# Patient Record
Sex: Female | Born: 1950 | Race: White | Hispanic: No | Marital: Married | Smoking: Former smoker
Health system: Southern US, Community
[De-identification: ages and names within clinical notes are randomized; demographics above are authoritative.]

## PROBLEM LIST (undated history)

## (undated) DIAGNOSIS — C50919 Malignant neoplasm of unspecified site of unspecified female breast: Secondary | ICD-10-CM

## (undated) DIAGNOSIS — K219 Gastro-esophageal reflux disease without esophagitis: Secondary | ICD-10-CM

## (undated) DIAGNOSIS — Z923 Personal history of irradiation: Secondary | ICD-10-CM

## (undated) DIAGNOSIS — M810 Age-related osteoporosis without current pathological fracture: Secondary | ICD-10-CM

## (undated) DIAGNOSIS — F32A Depression, unspecified: Secondary | ICD-10-CM

## (undated) DIAGNOSIS — T7840XA Allergy, unspecified, initial encounter: Secondary | ICD-10-CM

## (undated) DIAGNOSIS — D649 Anemia, unspecified: Secondary | ICD-10-CM

## (undated) DIAGNOSIS — I10 Essential (primary) hypertension: Secondary | ICD-10-CM

## (undated) DIAGNOSIS — F329 Major depressive disorder, single episode, unspecified: Secondary | ICD-10-CM

## (undated) DIAGNOSIS — Z9221 Personal history of antineoplastic chemotherapy: Secondary | ICD-10-CM

## (undated) DIAGNOSIS — E785 Hyperlipidemia, unspecified: Secondary | ICD-10-CM

## (undated) DIAGNOSIS — F419 Anxiety disorder, unspecified: Secondary | ICD-10-CM

## (undated) DIAGNOSIS — M8580 Other specified disorders of bone density and structure, unspecified site: Secondary | ICD-10-CM

## (undated) HISTORY — PX: COLONOSCOPY: SHX174

## (undated) HISTORY — DX: Essential (primary) hypertension: I10

## (undated) HISTORY — DX: Gastro-esophageal reflux disease without esophagitis: K21.9

## (undated) HISTORY — DX: Allergy, unspecified, initial encounter: T78.40XA

## (undated) HISTORY — PX: SHOULDER SURGERY: SHX246

## (undated) HISTORY — DX: Other specified disorders of bone density and structure, unspecified site: M85.80

## (undated) HISTORY — DX: Hyperlipidemia, unspecified: E78.5

## (undated) HISTORY — DX: Anemia, unspecified: D64.9

## (undated) HISTORY — PX: POLYPECTOMY: SHX149

## (undated) HISTORY — DX: Anxiety disorder, unspecified: F41.9

## (undated) HISTORY — PX: ABDOMINAL HYSTERECTOMY: SHX81

## (undated) HISTORY — DX: Depression, unspecified: F32.A

## (undated) HISTORY — DX: Age-related osteoporosis without current pathological fracture: M81.0

## (undated) HISTORY — PX: TONSILLECTOMY AND ADENOIDECTOMY: SHX28

## (undated) HISTORY — DX: Malignant neoplasm of unspecified site of unspecified female breast: C50.919

## (undated) HISTORY — PX: SEPTOPLASTY: SUR1290

## (undated) HISTORY — DX: Major depressive disorder, single episode, unspecified: F32.9

---

## 1997-08-15 ENCOUNTER — Other Ambulatory Visit: Admission: RE | Admit: 1997-08-15 | Discharge: 1997-08-15 | Payer: Self-pay | Admitting: Obstetrics & Gynecology

## 1998-09-19 ENCOUNTER — Other Ambulatory Visit: Admission: RE | Admit: 1998-09-19 | Discharge: 1998-09-19 | Payer: Self-pay | Admitting: Obstetrics & Gynecology

## 2001-03-15 ENCOUNTER — Other Ambulatory Visit: Admission: RE | Admit: 2001-03-15 | Discharge: 2001-03-15 | Payer: Self-pay | Admitting: Obstetrics & Gynecology

## 2001-05-31 ENCOUNTER — Encounter (INDEPENDENT_AMBULATORY_CARE_PROVIDER_SITE_OTHER): Payer: Self-pay | Admitting: Specialist

## 2001-05-31 ENCOUNTER — Observation Stay (HOSPITAL_COMMUNITY): Admission: RE | Admit: 2001-05-31 | Discharge: 2001-06-01 | Payer: Self-pay | Admitting: Obstetrics & Gynecology

## 2002-04-07 ENCOUNTER — Other Ambulatory Visit: Admission: RE | Admit: 2002-04-07 | Discharge: 2002-04-07 | Payer: Self-pay | Admitting: Obstetrics & Gynecology

## 2003-06-18 ENCOUNTER — Other Ambulatory Visit: Admission: RE | Admit: 2003-06-18 | Discharge: 2003-06-18 | Payer: Self-pay | Admitting: Obstetrics & Gynecology

## 2004-07-10 ENCOUNTER — Other Ambulatory Visit: Admission: RE | Admit: 2004-07-10 | Discharge: 2004-07-10 | Payer: Self-pay | Admitting: Obstetrics and Gynecology

## 2004-09-10 ENCOUNTER — Ambulatory Visit: Payer: Self-pay | Admitting: Internal Medicine

## 2004-09-11 ENCOUNTER — Ambulatory Visit: Payer: Self-pay | Admitting: Internal Medicine

## 2005-11-18 ENCOUNTER — Ambulatory Visit: Payer: Self-pay | Admitting: Internal Medicine

## 2006-10-13 ENCOUNTER — Encounter: Admission: RE | Admit: 2006-10-13 | Discharge: 2006-10-13 | Payer: Self-pay | Admitting: Obstetrics and Gynecology

## 2007-04-11 ENCOUNTER — Encounter: Admission: RE | Admit: 2007-04-11 | Discharge: 2007-04-11 | Payer: Self-pay | Admitting: Obstetrics and Gynecology

## 2007-07-01 ENCOUNTER — Ambulatory Visit: Payer: Self-pay | Admitting: Internal Medicine

## 2007-07-01 DIAGNOSIS — M949 Disorder of cartilage, unspecified: Secondary | ICD-10-CM

## 2007-07-01 DIAGNOSIS — K219 Gastro-esophageal reflux disease without esophagitis: Secondary | ICD-10-CM

## 2007-07-01 DIAGNOSIS — G4700 Insomnia, unspecified: Secondary | ICD-10-CM

## 2007-07-01 DIAGNOSIS — M899 Disorder of bone, unspecified: Secondary | ICD-10-CM | POA: Insufficient documentation

## 2007-07-07 ENCOUNTER — Telehealth (INDEPENDENT_AMBULATORY_CARE_PROVIDER_SITE_OTHER): Payer: Self-pay | Admitting: *Deleted

## 2007-07-07 DIAGNOSIS — D509 Iron deficiency anemia, unspecified: Secondary | ICD-10-CM | POA: Insufficient documentation

## 2007-07-07 LAB — CONVERTED CEMR LAB
ALT: 19 units/L (ref 0–35)
AST: 26 units/L (ref 0–37)
BUN: 10 mg/dL (ref 6–23)
Basophils Absolute: 0 10*3/uL (ref 0.0–0.1)
Basophils Relative: 0.7 % (ref 0.0–1.0)
CO2: 29 meq/L (ref 19–32)
Chloride: 107 meq/L (ref 96–112)
Creatinine, Ser: 0.8 mg/dL (ref 0.4–1.2)
Eosinophils Relative: 3.7 % (ref 0.0–5.0)
Ferritin: 6.2 ng/mL — ABNORMAL LOW (ref 10.0–291.0)
Folate: 15.6 ng/mL
HDL: 64.2 mg/dL (ref >39.0)
Iron: 21 ug/dL — ABNORMAL LOW (ref 42–145)
Lymphocytes Relative: 23.2 % (ref 12.0–46.0)
Neutrophils Relative %: 64 % (ref 43.0–77.0)
RBC: 4.08 M/uL (ref 3.87–5.11)
TSH: 1.41 microintl units/mL (ref 0.35–5.50)
Total Bilirubin: 0.9 mg/dL (ref 0.3–1.2)
Total CHOL/HDL Ratio: 3.6
VLDL: 23 mg/dL (ref 0–40)
WBC: 5.9 10*3/uL (ref 4.5–10.5)

## 2007-07-08 ENCOUNTER — Encounter (INDEPENDENT_AMBULATORY_CARE_PROVIDER_SITE_OTHER): Payer: Self-pay | Admitting: *Deleted

## 2007-07-11 ENCOUNTER — Telehealth (INDEPENDENT_AMBULATORY_CARE_PROVIDER_SITE_OTHER): Payer: Self-pay | Admitting: *Deleted

## 2007-07-21 ENCOUNTER — Telehealth (INDEPENDENT_AMBULATORY_CARE_PROVIDER_SITE_OTHER): Payer: Self-pay | Admitting: *Deleted

## 2007-07-28 ENCOUNTER — Ambulatory Visit: Payer: Self-pay | Admitting: Gastroenterology

## 2007-07-28 LAB — CONVERTED CEMR LAB: IgA: 161 mg/dL (ref 68–378)

## 2007-08-02 ENCOUNTER — Telehealth (INDEPENDENT_AMBULATORY_CARE_PROVIDER_SITE_OTHER): Payer: Self-pay | Admitting: *Deleted

## 2007-08-04 ENCOUNTER — Telehealth (INDEPENDENT_AMBULATORY_CARE_PROVIDER_SITE_OTHER): Payer: Self-pay | Admitting: *Deleted

## 2007-08-24 ENCOUNTER — Encounter: Payer: Self-pay | Admitting: Gastroenterology

## 2007-08-24 ENCOUNTER — Ambulatory Visit: Payer: Self-pay | Admitting: Gastroenterology

## 2007-08-25 ENCOUNTER — Encounter: Payer: Self-pay | Admitting: Gastroenterology

## 2007-11-21 ENCOUNTER — Telehealth (INDEPENDENT_AMBULATORY_CARE_PROVIDER_SITE_OTHER): Payer: Self-pay | Admitting: *Deleted

## 2007-12-02 ENCOUNTER — Encounter: Admission: RE | Admit: 2007-12-02 | Discharge: 2007-12-02 | Payer: Self-pay | Admitting: Obstetrics and Gynecology

## 2007-12-08 ENCOUNTER — Ambulatory Visit: Payer: Self-pay | Admitting: Family Medicine

## 2008-01-27 DIAGNOSIS — C50919 Malignant neoplasm of unspecified site of unspecified female breast: Secondary | ICD-10-CM

## 2008-01-27 HISTORY — DX: Malignant neoplasm of unspecified site of unspecified female breast: C50.919

## 2008-04-04 ENCOUNTER — Telehealth (INDEPENDENT_AMBULATORY_CARE_PROVIDER_SITE_OTHER): Payer: Self-pay | Admitting: *Deleted

## 2008-07-10 ENCOUNTER — Ambulatory Visit: Payer: Self-pay | Admitting: Internal Medicine

## 2008-07-13 ENCOUNTER — Ambulatory Visit: Payer: Self-pay | Admitting: Internal Medicine

## 2008-07-13 LAB — CONVERTED CEMR LAB: Vit D, 25-Hydroxy: 30 ng/mL (ref 30–89)

## 2008-07-17 DIAGNOSIS — E785 Hyperlipidemia, unspecified: Secondary | ICD-10-CM | POA: Insufficient documentation

## 2008-07-17 LAB — CONVERTED CEMR LAB
BUN: 8 mg/dL (ref 6–23)
Basophils Relative: 0.6 % (ref 0.0–3.0)
Calcium: 9.3 mg/dL (ref 8.4–10.5)
Cholesterol: 275 mg/dL — ABNORMAL HIGH (ref 0–200)
Creatinine, Ser: 0.7 mg/dL (ref 0.4–1.2)
Ferritin: 48 ng/mL (ref 10.0–291.0)
Folate: >20 ng/mL
GFR calc non Af Amer: 91.05 mL/min (ref >60)
Glucose, Bld: 92 mg/dL (ref 70–99)
Hemoglobin: 15.2 g/dL — ABNORMAL HIGH (ref 12.0–15.0)
Lymphocytes Relative: 22.9 % (ref 12.0–46.0)
Monocytes Relative: 6.8 % (ref 3.0–12.0)
Neutro Abs: 5.1 10*3/uL (ref 1.4–7.7)
Neutrophils Relative %: 66.6 % (ref 43.0–77.0)
RBC: 4.75 M/uL (ref 3.87–5.11)
Sodium: 141 meq/L (ref 135–145)
TSH: 1.3 microintl units/mL (ref 0.35–5.50)
Total CHOL/HDL Ratio: 3
Vitamin B-12: 458 pg/mL (ref 211–911)
WBC: 7.5 10*3/uL (ref 4.5–10.5)

## 2008-08-02 ENCOUNTER — Telehealth: Payer: Self-pay | Admitting: Internal Medicine

## 2008-09-26 LAB — CONVERTED CEMR LAB: Pap Smear: NORMAL

## 2008-10-12 ENCOUNTER — Telehealth (INDEPENDENT_AMBULATORY_CARE_PROVIDER_SITE_OTHER): Payer: Self-pay | Admitting: *Deleted

## 2008-10-16 ENCOUNTER — Encounter: Admission: RE | Admit: 2008-10-16 | Discharge: 2008-10-16 | Payer: Self-pay | Admitting: Obstetrics and Gynecology

## 2008-10-16 ENCOUNTER — Encounter (INDEPENDENT_AMBULATORY_CARE_PROVIDER_SITE_OTHER): Payer: Self-pay | Admitting: Obstetrics and Gynecology

## 2008-10-16 ENCOUNTER — Telehealth (INDEPENDENT_AMBULATORY_CARE_PROVIDER_SITE_OTHER): Payer: Self-pay | Admitting: *Deleted

## 2008-10-16 ENCOUNTER — Encounter (INDEPENDENT_AMBULATORY_CARE_PROVIDER_SITE_OTHER): Payer: Self-pay | Admitting: Radiology

## 2008-10-24 ENCOUNTER — Encounter: Admission: RE | Admit: 2008-10-24 | Discharge: 2008-10-24 | Payer: Self-pay | Admitting: Obstetrics and Gynecology

## 2008-10-26 ENCOUNTER — Encounter: Payer: Self-pay | Admitting: Internal Medicine

## 2008-10-26 ENCOUNTER — Encounter: Admission: RE | Admit: 2008-10-26 | Discharge: 2008-10-26 | Payer: Self-pay | Admitting: Surgery

## 2008-10-26 ENCOUNTER — Ambulatory Visit: Payer: Self-pay | Admitting: Internal Medicine

## 2008-10-26 DIAGNOSIS — C50919 Malignant neoplasm of unspecified site of unspecified female breast: Secondary | ICD-10-CM | POA: Insufficient documentation

## 2008-10-26 DIAGNOSIS — F411 Generalized anxiety disorder: Secondary | ICD-10-CM | POA: Insufficient documentation

## 2008-10-29 HISTORY — PX: BREAST LUMPECTOMY: SHX2

## 2008-10-31 ENCOUNTER — Encounter (INDEPENDENT_AMBULATORY_CARE_PROVIDER_SITE_OTHER): Payer: Self-pay | Admitting: Surgery

## 2008-10-31 ENCOUNTER — Ambulatory Visit (HOSPITAL_BASED_OUTPATIENT_CLINIC_OR_DEPARTMENT_OTHER): Admission: RE | Admit: 2008-10-31 | Discharge: 2008-11-01 | Payer: Self-pay | Admitting: Surgery

## 2008-10-31 ENCOUNTER — Encounter: Admission: RE | Admit: 2008-10-31 | Discharge: 2008-10-31 | Payer: Self-pay | Admitting: Surgery

## 2008-11-07 ENCOUNTER — Ambulatory Visit: Payer: Self-pay | Admitting: Oncology

## 2008-11-14 ENCOUNTER — Encounter: Payer: Self-pay | Admitting: Internal Medicine

## 2008-11-14 LAB — COMPREHENSIVE METABOLIC PANEL
AST: 17 U/L (ref 0–37)
Albumin: 4.5 g/dL (ref 3.5–5.2)
Alkaline Phosphatase: 81 U/L (ref 39–117)
BUN: 16 mg/dL (ref 6–23)
Creatinine, Ser: 0.84 mg/dL (ref 0.40–1.20)
Potassium: 4.8 mEq/L (ref 3.5–5.3)
Total Bilirubin: 0.8 mg/dL (ref 0.3–1.2)

## 2008-11-14 LAB — CBC WITH DIFFERENTIAL/PLATELET
Basophils Absolute: 0 10*3/uL (ref 0.0–0.1)
EOS%: 3.2 % (ref 0.0–7.0)
HGB: 14.3 g/dL (ref 11.6–15.9)
LYMPH%: 19.7 % (ref 14.0–49.7)
MCH: 31 pg (ref 25.1–34.0)
MCV: 90.3 fL (ref 79.5–101.0)
MONO%: 7.4 % (ref 0.0–14.0)
Platelets: 333 10*3/uL (ref 145–400)
RDW: 12.2 % (ref 11.2–14.5)

## 2008-11-14 LAB — VITAMIN D 25 HYDROXY (VIT D DEFICIENCY, FRACTURES): Vit D, 25-Hydroxy: 32 ng/mL (ref 30–89)

## 2008-11-14 LAB — CANCER ANTIGEN 27.29: CA 27.29: 11 U/mL (ref 0–39)

## 2008-11-21 ENCOUNTER — Ambulatory Visit: Payer: Self-pay | Admitting: Cardiology

## 2008-11-21 ENCOUNTER — Encounter: Payer: Self-pay | Admitting: Oncology

## 2008-11-21 ENCOUNTER — Ambulatory Visit: Payer: Self-pay

## 2008-11-21 ENCOUNTER — Ambulatory Visit (HOSPITAL_COMMUNITY): Admission: RE | Admit: 2008-11-21 | Discharge: 2008-11-21 | Payer: Self-pay | Admitting: Oncology

## 2008-11-23 ENCOUNTER — Encounter: Payer: Self-pay | Admitting: Internal Medicine

## 2008-11-27 ENCOUNTER — Ambulatory Visit (HOSPITAL_BASED_OUTPATIENT_CLINIC_OR_DEPARTMENT_OTHER): Admission: RE | Admit: 2008-11-27 | Discharge: 2008-11-27 | Payer: Self-pay | Admitting: Surgery

## 2008-12-05 ENCOUNTER — Ambulatory Visit: Payer: Self-pay | Admitting: Oncology

## 2008-12-06 ENCOUNTER — Ambulatory Visit (HOSPITAL_COMMUNITY): Admission: RE | Admit: 2008-12-06 | Discharge: 2008-12-06 | Payer: Self-pay | Admitting: Oncology

## 2008-12-07 ENCOUNTER — Encounter: Payer: Self-pay | Admitting: Internal Medicine

## 2008-12-07 LAB — BASIC METABOLIC PANEL
BUN: 16 mg/dL (ref 6–23)
CO2: 24 mEq/L (ref 19–32)
Chloride: 101 mEq/L (ref 96–112)
Creatinine, Ser: 0.78 mg/dL (ref 0.40–1.20)
Glucose, Bld: 135 mg/dL — ABNORMAL HIGH (ref 70–99)

## 2008-12-07 LAB — CBC WITH DIFFERENTIAL/PLATELET
Basophils Absolute: 0 10*3/uL (ref 0.0–0.1)
EOS%: 1 % (ref 0.0–7.0)
HCT: 39.3 % (ref 34.8–46.6)
HGB: 13.5 g/dL (ref 11.6–15.9)
MCH: 31 pg (ref 25.1–34.0)
MONO#: 0 10*3/uL — ABNORMAL LOW (ref 0.1–0.9)
NEUT%: 61.1 % (ref 38.4–76.8)
lymph#: 1.1 10*3/uL (ref 0.9–3.3)

## 2008-12-18 ENCOUNTER — Encounter: Payer: Self-pay | Admitting: Internal Medicine

## 2008-12-18 LAB — COMPREHENSIVE METABOLIC PANEL
AST: 25 U/L (ref 0–37)
Albumin: 3.8 g/dL (ref 3.5–5.2)
Alkaline Phosphatase: 87 U/L (ref 39–117)
BUN: 17 mg/dL (ref 6–23)
Potassium: 4.2 mEq/L (ref 3.5–5.3)
Sodium: 139 mEq/L (ref 135–145)
Total Protein: 6.2 g/dL (ref 6.0–8.3)

## 2008-12-18 LAB — CBC WITH DIFFERENTIAL/PLATELET
EOS%: 0.6 % (ref 0.0–7.0)
MCH: 31.3 pg (ref 25.1–34.0)
MCHC: 34.3 g/dL (ref 31.5–36.0)
MCV: 91.1 fL (ref 79.5–101.0)
MONO%: 6.1 % (ref 0.0–14.0)
RBC: 4.03 10*6/uL (ref 3.70–5.45)
RDW: 12.9 % (ref 11.2–14.5)

## 2008-12-28 ENCOUNTER — Encounter: Payer: Self-pay | Admitting: Internal Medicine

## 2008-12-28 LAB — CBC WITH DIFFERENTIAL/PLATELET
BASO%: 3.2 % — ABNORMAL HIGH (ref 0.0–2.0)
Eosinophils Absolute: 0 10*3/uL (ref 0.0–0.5)
HCT: 34.9 % (ref 34.8–46.6)
MCHC: 33.2 g/dL (ref 31.5–36.0)
MONO#: 0.3 10*3/uL (ref 0.1–0.9)
NEUT#: 0.6 10*3/uL — ABNORMAL LOW (ref 1.5–6.5)
NEUT%: 30.1 % — ABNORMAL LOW (ref 38.4–76.8)
RBC: 3.89 10*6/uL (ref 3.70–5.45)
WBC: 1.9 10*3/uL — ABNORMAL LOW (ref 3.9–10.3)
lymph#: 1 10*3/uL (ref 0.9–3.3)

## 2009-01-07 ENCOUNTER — Ambulatory Visit: Payer: Self-pay | Admitting: Oncology

## 2009-01-09 ENCOUNTER — Encounter: Payer: Self-pay | Admitting: Internal Medicine

## 2009-01-09 LAB — COMPREHENSIVE METABOLIC PANEL
ALT: 22 U/L (ref 0–35)
BUN: 13 mg/dL (ref 6–23)
CO2: 28 mEq/L (ref 19–32)
Calcium: 9.4 mg/dL (ref 8.4–10.5)
Chloride: 101 mEq/L (ref 96–112)
Creatinine, Ser: 0.68 mg/dL (ref 0.40–1.20)
Glucose, Bld: 86 mg/dL (ref 70–99)

## 2009-01-09 LAB — CBC WITH DIFFERENTIAL/PLATELET
Basophils Absolute: 0.1 10*3/uL (ref 0.0–0.1)
Eosinophils Absolute: 0 10*3/uL (ref 0.0–0.5)
HCT: 34.7 % — ABNORMAL LOW (ref 34.8–46.6)
HGB: 12.1 g/dL (ref 11.6–15.9)
MONO#: 0.9 10*3/uL (ref 0.1–0.9)
NEUT%: 81.8 % — ABNORMAL HIGH (ref 38.4–76.8)
Platelets: 413 10*3/uL — ABNORMAL HIGH (ref 145–400)
WBC: 13.6 10*3/uL — ABNORMAL HIGH (ref 3.9–10.3)
lymph#: 1.4 10*3/uL (ref 0.9–3.3)

## 2009-01-17 LAB — CBC WITH DIFFERENTIAL/PLATELET
BASO%: 1.7 % (ref 0.0–2.0)
HCT: 31.1 % — ABNORMAL LOW (ref 34.8–46.6)
HGB: 10.7 g/dL — ABNORMAL LOW (ref 11.6–15.9)
MCHC: 34.4 g/dL (ref 31.5–36.0)
MONO#: 0.3 10*3/uL (ref 0.1–0.9)
NEUT#: 0.6 10*3/uL — ABNORMAL LOW (ref 1.5–6.5)
NEUT%: 32 % — ABNORMAL LOW (ref 38.4–76.8)
WBC: 1.9 10*3/uL — ABNORMAL LOW (ref 3.9–10.3)
lymph#: 0.9 10*3/uL (ref 0.9–3.3)

## 2009-01-31 ENCOUNTER — Encounter: Payer: Self-pay | Admitting: Internal Medicine

## 2009-01-31 LAB — COMPREHENSIVE METABOLIC PANEL
AST: 18 U/L (ref 0–37)
Albumin: 4.4 g/dL (ref 3.5–5.2)
BUN: 14 mg/dL (ref 6–23)
CO2: 24 mEq/L (ref 19–32)
Calcium: 9.5 mg/dL (ref 8.4–10.5)
Chloride: 104 mEq/L (ref 96–112)
Creatinine, Ser: 0.62 mg/dL (ref 0.40–1.20)
Potassium: 4.2 mEq/L (ref 3.5–5.3)

## 2009-01-31 LAB — CBC WITH DIFFERENTIAL/PLATELET
BASO%: 0.2 % (ref 0.0–2.0)
Basophils Absolute: 0 10*3/uL (ref 0.0–0.1)
Eosinophils Absolute: 0 10*3/uL (ref 0.0–0.5)
HCT: 36.1 % (ref 34.8–46.6)
HGB: 12.1 g/dL (ref 11.6–15.9)
LYMPH%: 4.4 % — ABNORMAL LOW (ref 14.0–49.7)
MCHC: 33.5 g/dL (ref 31.5–36.0)
MONO#: 0.4 10*3/uL (ref 0.1–0.9)
NEUT#: 12.3 10*3/uL — ABNORMAL HIGH (ref 1.5–6.5)
NEUT%: 92.3 % — ABNORMAL HIGH (ref 38.4–76.8)
Platelets: 362 10*3/uL (ref 145–400)
WBC: 13.3 10*3/uL — ABNORMAL HIGH (ref 3.9–10.3)
lymph#: 0.6 10*3/uL — ABNORMAL LOW (ref 0.9–3.3)

## 2009-02-07 ENCOUNTER — Encounter: Payer: Self-pay | Admitting: Internal Medicine

## 2009-02-07 ENCOUNTER — Ambulatory Visit: Payer: Self-pay | Admitting: Oncology

## 2009-02-07 LAB — CBC WITH DIFFERENTIAL/PLATELET
BASO%: 3.2 % — ABNORMAL HIGH (ref 0.0–2.0)
Basophils Absolute: 0.1 10*3/uL (ref 0.0–0.1)
EOS%: 2.6 % (ref 0.0–7.0)
HCT: 32.2 % — ABNORMAL LOW (ref 34.8–46.6)
HGB: 10.5 g/dL — ABNORMAL LOW (ref 11.6–15.9)
MCH: 30.9 pg (ref 25.1–34.0)
MCHC: 32.6 g/dL (ref 31.5–36.0)
MONO#: 0.2 10*3/uL (ref 0.1–0.9)
RDW: 17 % — ABNORMAL HIGH (ref 11.2–14.5)
WBC: 1.6 10*3/uL — ABNORMAL LOW (ref 3.9–10.3)
lymph#: 0.8 10*3/uL — ABNORMAL LOW (ref 0.9–3.3)

## 2009-02-21 ENCOUNTER — Encounter: Payer: Self-pay | Admitting: Internal Medicine

## 2009-02-21 LAB — COMPREHENSIVE METABOLIC PANEL
AST: 15 U/L (ref 0–37)
Albumin: 4.2 g/dL (ref 3.5–5.2)
Alkaline Phosphatase: 73 U/L (ref 39–117)
Calcium: 9.9 mg/dL (ref 8.4–10.5)
Chloride: 103 mEq/L (ref 96–112)
Potassium: 4.6 mEq/L (ref 3.5–5.3)
Sodium: 139 mEq/L (ref 135–145)
Total Protein: 6.4 g/dL (ref 6.0–8.3)

## 2009-02-21 LAB — CBC WITH DIFFERENTIAL/PLATELET
BASO%: 1.8 % (ref 0.0–2.0)
EOS%: 0 % (ref 0.0–7.0)
MCH: 32.5 pg (ref 25.1–34.0)
MCHC: 34.1 g/dL (ref 31.5–36.0)
MCV: 95.5 fL (ref 79.5–101.0)
MONO%: 2.1 % (ref 0.0–14.0)
RBC: 3.75 10*6/uL (ref 3.70–5.45)
RDW: 17.2 % — ABNORMAL HIGH (ref 11.2–14.5)

## 2009-02-28 ENCOUNTER — Encounter: Payer: Self-pay | Admitting: Internal Medicine

## 2009-02-28 LAB — CBC WITH DIFFERENTIAL/PLATELET
Basophils Absolute: 0 10*3/uL (ref 0.0–0.1)
EOS%: 4.4 % (ref 0.0–7.0)
Eosinophils Absolute: 0 10*3/uL (ref 0.0–0.5)
HCT: 27.6 % — ABNORMAL LOW (ref 34.8–46.6)
HGB: 9.6 g/dL — ABNORMAL LOW (ref 11.6–15.9)
MCH: 32.7 pg (ref 25.1–34.0)
NEUT#: 0.3 10*3/uL — CL (ref 1.5–6.5)
NEUT%: 30.4 % — ABNORMAL LOW (ref 38.4–76.8)
lymph#: 0.5 10*3/uL — ABNORMAL LOW (ref 0.9–3.3)

## 2009-03-12 ENCOUNTER — Ambulatory Visit: Payer: Self-pay | Admitting: Oncology

## 2009-03-14 ENCOUNTER — Encounter: Payer: Self-pay | Admitting: Internal Medicine

## 2009-03-14 LAB — COMPREHENSIVE METABOLIC PANEL
AST: 23 U/L (ref 0–37)
Alkaline Phosphatase: 61 U/L (ref 39–117)
BUN: 12 mg/dL (ref 6–23)
Creatinine, Ser: 0.67 mg/dL (ref 0.40–1.20)
Glucose, Bld: 80 mg/dL (ref 70–99)

## 2009-03-14 LAB — CBC WITH DIFFERENTIAL/PLATELET
Basophils Absolute: 0 10*3/uL (ref 0.0–0.1)
EOS%: 0 % (ref 0.0–7.0)
Eosinophils Absolute: 0 10*3/uL (ref 0.0–0.5)
HCT: 31 % — ABNORMAL LOW (ref 34.8–46.6)
HGB: 10.6 g/dL — ABNORMAL LOW (ref 11.6–15.9)
LYMPH%: 9.1 % — ABNORMAL LOW (ref 14.0–49.7)
MCH: 32.4 pg (ref 25.1–34.0)
MCV: 94.8 fL (ref 79.5–101.0)
MONO%: 8.3 % (ref 0.0–14.0)
NEUT%: 82.3 % — ABNORMAL HIGH (ref 38.4–76.8)
Platelets: 504 10*3/uL — ABNORMAL HIGH (ref 145–400)

## 2009-03-21 ENCOUNTER — Encounter: Payer: Self-pay | Admitting: Internal Medicine

## 2009-03-21 LAB — CBC WITH DIFFERENTIAL/PLATELET
Basophils Absolute: 0.1 10*3/uL (ref 0.0–0.1)
EOS%: 2.3 % (ref 0.0–7.0)
Eosinophils Absolute: 0 10*3/uL (ref 0.0–0.5)
HCT: 28.8 % — ABNORMAL LOW (ref 34.8–46.6)
HGB: 9.2 g/dL — ABNORMAL LOW (ref 11.6–15.9)
MCH: 30.6 pg (ref 25.1–34.0)
MCV: 95.7 fL (ref 79.5–101.0)
MONO%: 18.4 % — ABNORMAL HIGH (ref 0.0–14.0)
NEUT#: 0.2 10*3/uL — CL (ref 1.5–6.5)
NEUT%: 25.3 % — ABNORMAL LOW (ref 38.4–76.8)
RDW: 15.3 % — ABNORMAL HIGH (ref 11.2–14.5)
lymph#: 0.4 10*3/uL — ABNORMAL LOW (ref 0.9–3.3)

## 2009-03-26 ENCOUNTER — Encounter: Payer: Self-pay | Admitting: Internal Medicine

## 2009-03-26 ENCOUNTER — Ambulatory Visit: Admission: RE | Admit: 2009-03-26 | Discharge: 2009-05-29 | Payer: Self-pay | Admitting: Radiation Oncology

## 2009-03-29 ENCOUNTER — Encounter: Payer: Self-pay | Admitting: Internal Medicine

## 2009-03-29 LAB — CBC WITH DIFFERENTIAL/PLATELET
BASO%: 0.3 % (ref 0.0–2.0)
Basophils Absolute: 0 10*3/uL (ref 0.0–0.1)
HCT: 31.6 % — ABNORMAL LOW (ref 34.8–46.6)
HGB: 10.7 g/dL — ABNORMAL LOW (ref 11.6–15.9)
MCHC: 34 g/dL (ref 31.5–36.0)
MONO#: 1.1 10*3/uL — ABNORMAL HIGH (ref 0.1–0.9)
NEUT%: 79.1 % — ABNORMAL HIGH (ref 38.4–76.8)
RDW: 16.2 % — ABNORMAL HIGH (ref 11.2–14.5)
WBC: 11 10*3/uL — ABNORMAL HIGH (ref 3.9–10.3)
lymph#: 1.1 10*3/uL (ref 0.9–3.3)

## 2009-05-29 ENCOUNTER — Ambulatory Visit: Payer: Self-pay | Admitting: Oncology

## 2009-05-30 LAB — CBC WITH DIFFERENTIAL/PLATELET
Basophils Absolute: 0 10*3/uL (ref 0.0–0.1)
EOS%: 3.3 % (ref 0.0–7.0)
HCT: 37.3 % (ref 34.8–46.6)
HGB: 13 g/dL (ref 11.6–15.9)
LYMPH%: 20.3 % (ref 14.0–49.7)
MCH: 31.8 pg (ref 25.1–34.0)
MCV: 91.1 fL (ref 79.5–101.0)
MONO%: 11.5 % (ref 0.0–14.0)
NEUT%: 64.4 % (ref 38.4–76.8)

## 2009-05-30 LAB — COMPREHENSIVE METABOLIC PANEL
AST: 22 U/L (ref 0–37)
Alkaline Phosphatase: 80 U/L (ref 39–117)
BUN: 13 mg/dL (ref 6–23)
Creatinine, Ser: 0.74 mg/dL (ref 0.40–1.20)

## 2009-05-30 LAB — VITAMIN D 25 HYDROXY (VIT D DEFICIENCY, FRACTURES): Vit D, 25-Hydroxy: 44 ng/mL (ref 30–89)

## 2009-06-06 ENCOUNTER — Encounter: Payer: Self-pay | Admitting: Internal Medicine

## 2009-06-10 ENCOUNTER — Ambulatory Visit (HOSPITAL_BASED_OUTPATIENT_CLINIC_OR_DEPARTMENT_OTHER): Admission: RE | Admit: 2009-06-10 | Discharge: 2009-06-10 | Payer: Self-pay | Admitting: Surgery

## 2009-07-31 ENCOUNTER — Ambulatory Visit: Payer: Self-pay | Admitting: Internal Medicine

## 2009-08-01 ENCOUNTER — Ambulatory Visit: Payer: Self-pay | Admitting: Internal Medicine

## 2009-08-05 LAB — CONVERTED CEMR LAB
ALT: 20 units/L (ref 0–35)
BUN: 13 mg/dL (ref 6–23)
Basophils Relative: 0.6 % (ref 0.0–3.0)
Chloride: 105 meq/L (ref 96–112)
Creatinine, Ser: 0.7 mg/dL (ref 0.4–1.2)
Direct LDL: 196.2 mg/dL
Eosinophils Absolute: 0.2 10*3/uL (ref 0.0–0.7)
Eosinophils Relative: 3.6 % (ref 0.0–5.0)
Glucose, Bld: 83 mg/dL (ref 70–99)
Hemoglobin: 14.4 g/dL (ref 12.0–15.0)
Lymphocytes Relative: 18.6 % (ref 12.0–46.0)
MCHC: 34.5 g/dL (ref 30.0–36.0)
MCV: 89.8 fL (ref 78.0–100.0)
Neutro Abs: 4.1 10*3/uL (ref 1.4–7.7)
Neutrophils Relative %: 69.5 % (ref 43.0–77.0)
Potassium: 4.3 meq/L (ref 3.5–5.1)
RBC: 4.64 M/uL (ref 3.87–5.11)
VLDL: 27.4 mg/dL (ref 0.0–40.0)
WBC: 5.9 10*3/uL (ref 4.5–10.5)

## 2009-10-16 ENCOUNTER — Ambulatory Visit: Payer: Self-pay | Admitting: Oncology

## 2009-10-17 ENCOUNTER — Encounter: Admission: RE | Admit: 2009-10-17 | Discharge: 2009-10-17 | Payer: Self-pay | Admitting: Oncology

## 2009-10-18 ENCOUNTER — Encounter: Payer: Self-pay | Admitting: Internal Medicine

## 2009-10-18 LAB — COMPREHENSIVE METABOLIC PANEL
AST: 26 U/L (ref 0–37)
Albumin: 4.6 g/dL (ref 3.5–5.2)
Alkaline Phosphatase: 100 U/L (ref 39–117)
Glucose, Bld: 84 mg/dL (ref 70–99)
Potassium: 4.4 mEq/L (ref 3.5–5.3)
Sodium: 138 mEq/L (ref 135–145)
Total Protein: 6.9 g/dL (ref 6.0–8.3)

## 2009-10-18 LAB — CBC WITH DIFFERENTIAL/PLATELET
Eosinophils Absolute: 0.2 10*3/uL (ref 0.0–0.5)
MCV: 91.1 fL (ref 79.5–101.0)
MONO#: 0.5 10*3/uL (ref 0.1–0.9)
MONO%: 8.5 % (ref 0.0–14.0)
NEUT#: 3.5 10*3/uL (ref 1.5–6.5)
RBC: 4.52 10*6/uL (ref 3.70–5.45)
RDW: 13.9 % (ref 11.2–14.5)
WBC: 5.3 10*3/uL (ref 3.9–10.3)
lymph#: 1.2 10*3/uL (ref 0.9–3.3)

## 2009-10-18 LAB — CANCER ANTIGEN 27.29: CA 27.29: 16 U/mL (ref 0–39)

## 2010-01-06 ENCOUNTER — Ambulatory Visit (HOSPITAL_BASED_OUTPATIENT_CLINIC_OR_DEPARTMENT_OTHER)
Admission: RE | Admit: 2010-01-06 | Discharge: 2010-01-06 | Payer: Self-pay | Source: Home / Self Care | Attending: Family Medicine | Admitting: Family Medicine

## 2010-01-06 ENCOUNTER — Ambulatory Visit: Payer: Self-pay | Admitting: Family Medicine

## 2010-01-06 DIAGNOSIS — M549 Dorsalgia, unspecified: Secondary | ICD-10-CM | POA: Insufficient documentation

## 2010-01-21 ENCOUNTER — Encounter
Admission: RE | Admit: 2010-01-21 | Discharge: 2010-01-21 | Payer: Self-pay | Source: Home / Self Care | Attending: Oncology | Admitting: Oncology

## 2010-01-28 ENCOUNTER — Telehealth: Payer: Self-pay | Admitting: Internal Medicine

## 2010-02-16 ENCOUNTER — Encounter: Payer: Self-pay | Admitting: Oncology

## 2010-02-25 NOTE — Letter (Signed)
[facility]   Imported By: Lanelle Bal 02/27/2009 09:46:49  _____________________________________________________________________  External Attachment:    Type:   Image     Comment:   External Document

## 2010-02-25 NOTE — Letter (Signed)
[facility]   Imported By: Lanelle Bal 06/26/2009 08:13:54  _____________________________________________________________________  External Attachment:    Type:   Image     Comment:   External Document

## 2010-02-25 NOTE — Letter (Signed)
[facility]   Imported By: Lanelle Bal 03/14/2009 12:15:47  _____________________________________________________________________  External Attachment:    Type:   Image     Comment:   External Document

## 2010-02-25 NOTE — Letter (Signed)
[facility]   Imported By: Lanelle Bal 04/19/2009 13:53:14  _____________________________________________________________________  External Attachment:    Type:   Image     Comment:   External Document

## 2010-02-25 NOTE — Letter (Signed)
Summary: [facility]  [facility]   Imported By: Lanelle Bal 02/06/2009 15:18:09  _____________________________________________________________________  External Attachment:    Type:   Image     Comment:   External Document

## 2010-02-25 NOTE — Letter (Signed)
Summary[facility]  [facility]   Imported By: Lanelle Bal 03/14/2009 12:14:55  _____________________________________________________________________  External Attachment:    Type:   Image     Comment:   External Document

## 2010-02-25 NOTE — Assessment & Plan Note (Signed)
Summary: cpx/cbs   Vital Signs:  Patient profile:   60 year old female Height:      64.25 inches Weight:      150.13 pounds BMI:     25.66 Pulse rate:   76 / minute Pulse rhythm:   regular BP sitting:   130 / 84  (right arm) Cuff size:   regular  Vitals Entered By: Army Fossa CMA (July 31, 2009 1:03 PM) CC: CPX, not fasting   History of Present Illness: CPX s/p treatment for breast ca-- see PMH was told prognosis of  recurrence was 1 on 4  very satisfiet w/ the treatment , optimistic  Preventive Screening-Counseling & Management  Caffeine-Diet-Exercise     Type of exercise: walking     Times/week: 3  Allergies (verified): No Known Drug Allergies  Past History:  Past Medical History: GERD--Asx x a while Osteopenia f/u per gyn  G2 P2 Breast Ca Dx 09-2008, estrogen receptor negative: s/p lumpectomy, XRT , Chemo (last treatment 5-11) Depression Anxiety  Past Surgical History: Hysterectomy Oophorectomy lumpectomy   Family History: Reviewed history from 07/10/2008 and no changes required. 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 (1980) Alcohol Use - yes 1 per day (beer) Daily Caffeine Use 3 per day Illicit Drug Use - no Patient  trying to  regularly  exercise   Review of Systems General:  (+) fatigue, pt thinks related to chemo and XRT. CV:  Denies chest pain or discomfort, palpitations, and swelling of feet. Resp:  Denies cough and shortness of breath. GI:  Denies bloody stools, diarrhea, nausea, and vomiting. GU:  no vag or bleed . Psych:  sleeps well with Ambien Moves well controlled with citalopram. Endo:  Denies weight change.  Physical Exam  General:   alert, well-developed, and well-nourished.   Neck:  no masses, no thyromegaly, and normal carotid upstroke.   Lungs:  normal respiratory effort, no intercostal retractions, no accessory muscle use, and normal breath sounds.   Heart:  normal rate, regular rhythm, no murmur, no gallop, and no rub.   Abdomen:  soft, non-tender, no distention, no masses, no guarding, and no rigidity.   Waist 31 inches Extremities:  no lower extremity edema Neurologic:  alert & oriented X3, strength normal in all extremities, and gait normal.   Psych:  Cognition and judgment appear intact. Alert and cooperative with normal attention span and concentration.  not anxious appearing and not depressed appearing.     Impression & Recommendations:  Problem # 1:  HEALTH SCREENING (ICD-V70.0) Td 6-09 Female care per Gyn osteopenia per gynecology cont healthy life style Cscope 07-2007, benign polyps, next 07-2017 labs Waist 31 inches (measure per patient request, her job requires certain metrics for her health insurance)  Problem # 2:  ADENOCARCINOMA, BREAST (ICD-174.9) status post treatment, doing emotionally well. See history of present illness  Problem # 3:  ANXIETY (ICD-300.00) despite her recent cancer, symptoms are well controlled with present treatment  refill meds  Problem # 4:  OSTEOPENIA (ICD-733.90) per gynecology  Complete Medication List: 1)  Mg  2)  Biotin  3)  Ambien 5 Mg  Tabs (Zolpidem tartrate) .Marland Kitchen.. 1 by mouth once daily 4)  Citalopram Hydrobromide 20 Mg Tabs (Citalopram hydrobromide) .... 1.5 by mouth once daily  Patient Instructions: 1)  Came back fasting: 2)  FLP, CBC, BMP, AST, ALT, TSH  dx v70 3)  Please schedule a follow-up appointment in 1 year.  4)  Check your blood pressure 2 or 3 times a week. If it is more than 140/85 consistently,please let us know  Prescriptions: CITALOPRAM HYDROBROMIDE 20 MG TABS (CITALOPRAM HYDROBROMIDE) 1.5 by mouth once daily  #140 x 3   Entered and  Authorized by:   Elita Quick E. Paz MD   Signed by:   Nolon Rod. Paz MD on 07/31/2009   Method used:   Print then Give to Patient   RxID:   [id number] AMBIEN 5 MG  TABS (ZOLPIDEM TARTRATE) 1 by mouth once daily  #90 x 1   Entered and Authorized by:   Elita Quick E. Paz MD   Signed by:   Nolon Rod. Paz MD on 07/31/2009   Method used:   Print then Give to Patient   RxID:   [id number]    Preventive Care Screening  Mammogram:    Date:  09/26/2008    Results:  abnormal- Dx Breast cancer   Pap Smear:    Date:  09/26/2008    Results:  normal     Risk Factors:  Exercise:  yes    Times per week:  3    Type:  walking  Mammogram History:     Date of Last Mammogram:  09/26/2008    Results:  abnormal- Dx Breast cancer   PAP Smear History:     Date of Last PAP Smear:  09/26/2008    Results:  normal

## 2010-02-25 NOTE — Letter (Signed)
[facility]   Imported By: Lanelle Bal 05/13/2009 11:57:13  _____________________________________________________________________  External Attachment:    Type:   Image     Comment:   External Document

## 2010-02-25 NOTE — Letter (Signed)
Summary: [facility]   Imported By: Lanelle Bal 04/04/2009 12:34:42  _____________________________________________________________________  External Attachment:    Type:   Image     Comment:   External Document

## 2010-02-25 NOTE — Letter (Signed)
[facility]   Imported By: Lanelle Bal 02/27/2009 09:46:11  _____________________________________________________________________  External Attachment:    Type:   Image     Comment:   External Document

## 2010-02-25 NOTE — Letter (Signed)
Summary: routine followup, stable-oncology   [facility]   Imported By: Lanelle Bal 11/12/2009 15:22:32  _____________________________________________________________________  External Attachment:    Type:   Image     Comment:   External Document

## 2010-02-25 NOTE — Letter (Signed)
[facility]   Imported By: Lanelle Bal 04/05/2009 09:27:45  _____________________________________________________________________  External Attachment:    Type:   Image     Comment:   External Document

## 2010-02-25 NOTE — Letter (Signed)
Summary: [facility]  [facility]   Imported By: Lanelle Bal 02/12/2009 11:31:59  _____________________________________________________________________  External Attachment:    Type:   Image     Comment:   External Document

## 2010-02-27 NOTE — Letter (Signed)
Summary: Out of Work  [facility] [employer]  [address] [place], [place] [address]   Phone: [phone]  Fax: [phone]    January 06, 2010   Employee:  MAZI SCHUFF    To Whom It May Concern:   For Medical reasons, please excuse the above named employee from work for the following dates:  Start:   01/06/2010  End:   01/06/2010  If you need additional information, please feel free to contact our office.         Sincerely,    Loreen Freud DO

## 2010-02-27 NOTE — Progress Notes (Signed)
Summary: Scabies concern  Phone Note Call from Patient Call back at Home Phone [phone]   Summary of Call: Patient left message on triage that her son was seen earlier today and has scabies. She notes that he is/has been staying with her and her husband and she would like to know if there are any precautions they should take and if they should be given the prescription cream he was given. Please advise. Initial call taken by: Lucious Groves CMA,  January 28, 2010 4:27 PM  Follow-up for Phone Call        we need to wash all the bed sheets that her son was in contact  with as well as all his clothing. If she or her husband develop any rash or itching, they will need treatment Follow-up by: Jose E. Paz MD,  January 29, 2010 5:01 PM  Additional Follow-up for Phone Call Additional follow up Details #1::        Left message on machine to call back to office. Lucious Groves CMA  January 30, 2010 9:12 AM   Left message on machine to call back to office. Lucious Groves CMA  January 31, 2010 9:37 AM   No return call, left message on machine to call back to office. Lucious Groves CMA  February 03, 2010 11:47 AM      Additional Follow-up for Phone Call Additional follow up Details #2::    Patient has not returned my calls, send a letter or no further action? Please advise. Lucious Groves CMA  February 04, 2010 10:57 AM  as long as we LMOM we are ok Jose E. Paz MD  February 04, 2010 12:49 PM   Additional Follow-up for Phone Call Additional follow up Details #3:: Details for Additional Follow-up Action Taken: Noted. Additional Follow-up by: Lucious Groves CMA,  February 04, 2010 1:00 PM

## 2010-02-27 NOTE — Assessment & Plan Note (Signed)
Summary: FELL DOWN STEPS/KN   Vital Signs:  Patient profile:   60 year old female Weight:      152.6 pounds Temp:     98.7 degrees F oral BP sitting:   112 / 72  (right arm) Cuff size:   regular  Vitals Entered By: Almeta Monas CMA Duncan Dull) (January 06, 2010 11:29 AM) CC: x3 days c/o falling down the stairs-- hurt the left side of her upper body   History of Present Illness:  Injury      This is a 60 year old woman who presents with An injury.  The symptoms began 3 days ago.  Pt fell down carpeted stairs on Saturday am.  Pt landed on L side and arm and c/o spasms in back.  No radiation down leg.  The patient also reports redness and tenderness.  The patient denies swelling, increased warmth deformity, blood loss, numbness, weakness, loss of sensation, coolness of extremity, and loss of consciousness.  The patient denies the following risk factors for significant bleeding: aspirin use, anticoagulant use, and history of bleeding disorder.  Screening for risk of abuse was negative.    Current Medications (verified): 1)  Mg 2)  Biotin 3)  Ambien 5 Mg  Tabs (Zolpidem Tartrate) .Marland Kitchen.. 1 By Mouth Once Daily 4)  Citalopram Hydrobromide 20 Mg Tabs (Citalopram Hydrobromide) .... 1.5 By Mouth Once Daily 5)  Flexeril 10 Mg Tabs (Cyclobenzaprine Hcl) .Marland Kitchen.. 1 By Mouth Three Times A Day As Needed 6)  Vicodin Es 7.5-750 Mg Tabs (Hydrocodone-Acetaminophen) .Marland Kitchen.. 1 By Mouth Every 6 Hours As Needed  Allergies (verified): No Known Drug Allergies  Past History:  Past Medical History: Last updated: 07/31/2009 GERD--Asx x a while Osteopenia f/u per gyn  G2 P2 Breast Ca Dx 09-2008, estrogen receptor negative: s/p lumpectomy, XRT , Chemo (last treatment 5-11) Depression Anxiety  Past Surgical History: Last updated: 07/31/2009 Hysterectomy Oophorectomy lumpectomy   Family History: Last updated: 07-21-2008 M - deceased age 61 from complications of DM F - deceased age 69 stomach Ca CAD - no HTN  - M DM - M (late onset age 63) throat Ca - M (smoker) stroke - M colon Ca - no breast Ca - no stomach Ca - F  Social History: Last updated: 07/31/2009 1 chlld, 1 gived for a doption Married ocupation-- Community education officer , 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 M - deceased age 28 from complications of DM F - deceased age 73 stomach Ca CAD - no HTN - M DM - M (late onset age 62) throat Ca - M (smoker) stroke - M colon Ca - no breast Ca - no stomach Ca - F  Social History: Reviewed history from 07/31/2009 and no changes required. 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   Neurologic:  + L SLR strength normal in all extremities, gait normal, and DTRs symmetrical and normal.   Skin:  + abrasion L low back  no brusing Psych:  Oriented X3 and normally interactive.     Impression & Recommendations:  Problem # 1:  BACK PAIN (ICD-724.5)  Her updated medication list for this problem includes:    Flexeril 10 Mg Tabs (Cyclobenzaprine hcl) .Marland Kitchen... 1 by mouth three times a day as needed    Vicodin Es 7.5-750 Mg Tabs (Hydrocodone-acetaminophen) .Marland Kitchen... 1 by mouth  every 6 hours as needed  Orders: T-Thoracic Spine 2 Views [phone]) T-Lumbar Spine 2 Views (72100TC)  Discussed use of moist heat or ice, modified activities, medications, and stretching/strengthening exercises. Back care instructions given. To be seen in 2 weeks if no improvement; sooner if worsening of symptoms.   Complete Medication List: 1)  Mg  2)  Biotin  3)  Ambien 5 Mg Tabs (Zolpidem tartrate) .Marland Kitchen.. 1 by mouth once daily 4)  Citalopram Hydrobromide 20 Mg Tabs (Citalopram hydrobromide) .... 1.5 by mouth once daily 5)  Flexeril 10 Mg Tabs (Cyclobenzaprine hcl) .Marland Kitchen.. 1 by mouth three times a day as needed 6)  Vicodin Es 7.5-750 Mg Tabs (Hydrocodone-acetaminophen) .Marland Kitchen.. 1 by mouth every 6 hours as needed Prescriptions: VICODIN ES 7.5-750 MG TABS (HYDROCODONE-ACETAMINOPHEN) 1 by mouth every 6 hours as needed  #30 x 0   Entered and Authorized by:   Loreen Freud DO   Signed by:   Loreen Freud DO on 01/06/2010   Method used:   Print then Give to Patient   RxID:   [id number] FLEXERIL 10 MG TABS (CYCLOBENZAPRINE HCL) 1 by mouth three times a day as needed  #30 x 0   Entered and Authorized by:   Loreen Freud DO   Signed by:   Loreen Freud DO on 01/06/2010   Method used:   Print then Give to Patient   RxID:   [id number]    Orders Added: 1)  T-Thoracic Spine 2 Views [72070TC] 2)  T-Lumbar Spine 2 Views [72100TC] 3)  Est. Patient Level III [52841]

## 2010-04-09 ENCOUNTER — Other Ambulatory Visit: Payer: Self-pay | Admitting: Surgery

## 2010-04-09 DIAGNOSIS — Z9889 Other specified postprocedural states: Secondary | ICD-10-CM

## 2010-04-09 DIAGNOSIS — N63 Unspecified lump in unspecified breast: Secondary | ICD-10-CM

## 2010-04-14 ENCOUNTER — Other Ambulatory Visit: Payer: Self-pay | Admitting: Oncology

## 2010-04-14 ENCOUNTER — Encounter (HOSPITAL_BASED_OUTPATIENT_CLINIC_OR_DEPARTMENT_OTHER): Payer: 59 | Admitting: Oncology

## 2010-04-14 DIAGNOSIS — C50219 Malignant neoplasm of upper-inner quadrant of unspecified female breast: Secondary | ICD-10-CM

## 2010-04-14 LAB — COMPREHENSIVE METABOLIC PANEL
Alkaline Phosphatase: 92 U/L (ref 39–117)
BUN: 13 mg/dL (ref 6–23)
CO2: 28 mEq/L (ref 19–32)
Creatinine, Ser: 0.75 mg/dL (ref 0.40–1.20)
Glucose, Bld: 86 mg/dL (ref 70–99)
Total Bilirubin: 1 mg/dL (ref 0.3–1.2)
Total Protein: 6.6 g/dL (ref 6.0–8.3)

## 2010-04-14 LAB — CBC WITH DIFFERENTIAL/PLATELET
Eosinophils Absolute: 0.4 10*3/uL (ref 0.0–0.5)
HCT: 40.7 % (ref 34.8–46.6)
LYMPH%: 16.9 % (ref 14.0–49.7)
MCV: 89.6 fL (ref 79.5–101.0)
MONO#: 0.6 10*3/uL (ref 0.1–0.9)
MONO%: 8.3 % (ref 0.0–14.0)
NEUT#: 4.9 10*3/uL (ref 1.5–6.5)
NEUT%: 69.3 % (ref 38.4–76.8)
Platelets: 239 10*3/uL (ref 145–400)
RBC: 4.54 10*6/uL (ref 3.70–5.45)
WBC: 7 10*3/uL (ref 3.9–10.3)

## 2010-04-14 LAB — CANCER ANTIGEN 27.29: CA 27.29: 15 U/mL (ref 0–39)

## 2010-04-14 LAB — VITAMIN D 25 HYDROXY (VIT D DEFICIENCY, FRACTURES): Vit D, 25-Hydroxy: 47 ng/mL (ref 30–89)

## 2010-04-15 ENCOUNTER — Ambulatory Visit
Admission: RE | Admit: 2010-04-15 | Discharge: 2010-04-15 | Disposition: A | Discharge status: Home / Self Care | Payer: 59 | Source: Ambulatory Visit | Attending: Surgery | Admitting: Surgery

## 2010-04-15 ENCOUNTER — Other Ambulatory Visit: Payer: Self-pay | Admitting: Surgery

## 2010-04-15 DIAGNOSIS — N63 Unspecified lump in unspecified breast: Secondary | ICD-10-CM

## 2010-04-15 DIAGNOSIS — Z9889 Other specified postprocedural states: Secondary | ICD-10-CM

## 2010-04-16 ENCOUNTER — Encounter (HOSPITAL_BASED_OUTPATIENT_CLINIC_OR_DEPARTMENT_OTHER): Payer: 59 | Admitting: Oncology

## 2010-04-16 DIAGNOSIS — C50219 Malignant neoplasm of upper-inner quadrant of unspecified female breast: Secondary | ICD-10-CM

## 2010-04-18 ENCOUNTER — Other Ambulatory Visit: Payer: Self-pay | Admitting: Oncology

## 2010-04-18 DIAGNOSIS — Z9889 Other specified postprocedural states: Secondary | ICD-10-CM

## 2010-04-30 LAB — POCT HEMOGLOBIN-HEMACUE: Hemoglobin: 14.1 g/dL (ref 12.0–15.0)

## 2010-05-01 LAB — COMPREHENSIVE METABOLIC PANEL
AST: 24 U/L (ref 0–37)
BUN: 8 mg/dL (ref 6–23)
CO2: 31 mEq/L (ref 19–32)
Calcium: 9.9 mg/dL (ref 8.4–10.5)
Chloride: 103 mEq/L (ref 96–112)
Creatinine, Ser: 0.83 mg/dL (ref 0.4–1.2)
GFR calc Af Amer: >60 mL/min (ref >60)
GFR calc non Af Amer: >60 mL/min (ref >60)
Glucose, Bld: 109 mg/dL — ABNORMAL HIGH (ref 70–99)
Total Bilirubin: 1 mg/dL (ref 0.3–1.2)

## 2010-05-01 LAB — URINALYSIS, ROUTINE W REFLEX MICROSCOPIC
Bilirubin Urine: NEGATIVE
Glucose, UA: NEGATIVE mg/dL
Hgb urine dipstick: NEGATIVE
Specific Gravity, Urine: 1.016 (ref 1.005–1.030)
Urobilinogen, UA: 0.2 mg/dL (ref 0.0–1.0)
pH: 7 (ref 5.0–8.0)

## 2010-05-01 LAB — CBC
HCT: 41.1 % (ref 36.0–46.0)
Hemoglobin: 14.4 g/dL (ref 12.0–15.0)
MCHC: 35 g/dL (ref 30.0–36.0)
MCV: 90.9 fL (ref 78.0–100.0)
RBC: 4.53 MIL/uL (ref 3.87–5.11)
WBC: 6.5 10*3/uL (ref 4.0–10.5)

## 2010-05-01 LAB — DIFFERENTIAL
Basophils Absolute: 0 10*3/uL (ref 0.0–0.1)
Eosinophils Relative: 3 % (ref 0–5)
Lymphocytes Relative: 23 % (ref 12–46)
Lymphs Abs: 1.5 10*3/uL (ref 0.7–4.0)
Neutrophils Relative %: 66 % (ref 43–77)

## 2010-06-13 NOTE — H&P (Signed)
[facility]  Patient:    Leslie Crawford, Leslie Crawford Visit Number: [id number] MRN: [id number]          Service Type: DSU Location: 9300 [id number] Attending Physician:  Minette Headland Dictated by:   Freddy Finner, M.D. Admit Date:  05/31/2001                           History and Physical  SOCIAL SECURITY NUMBER:       [id number]  ADMITTING DIAGNOSES:          1. Uterine leiomyomata with rapid recent                                  increase in size.                               2. Dysfunctional bleeding, unresponsive to                                  cyclic hormonal therapy and oral                                  contraceptives.                               3. 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  HISTORY OF PRESENT ILLNESS:   The patient is a 60 year old white married female gravida 2, para 2, who has been followed in my office since 1999.  At that time, with her initial visit, she was having perimenopausal kinds of symptoms and irregular menses.  She was started on hormone replacement therapy at that time, and had some irregular bleeding, and had pelvic ultrasound at that time in 1999, which showed a 3.5 cm to 4 cm fibroid, and another smaller lesion.  She did relatively well on Premphase until fairly recently, at which time, she noted persistent continuous bleeding.  She was started on oral contraceptives which helped decreased the flow, but did not stop the bleeding all together.  Recent pelvic ultrasound showed marked enlargement of one fibroid to approximately 7 cm in diameter and another fundal lesion was 3 x 2 cm.  This particular lesion on clinical exam is soft and very tender consistent with degenerating myoma or adenomyoma.  Because of the persistence of her symptoms, her wish to continue with hormonal therapy, and have resolution of bleeding, she is now admitted  for laparoscopically-assisted vaginal hysterectomy and bilateral salpingo-oophorectomy.  REVIEW OF SYSTEMS:            Her current review of systems is otherwise negative.  There is no cardiopulmonary, GI, or GU complaints.  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  Followup since time has been normal.  She has had two previous pregnancies and vaginal deliveries.  One of those pregnancies was adopted out in  1969.  She had a diagnosis of HSV in 1980.  SOCIAL HISTORY:               She uses alcohol socially.  She is not a cigarette smoker.  PAST SURGICAL HISTORY:        Include laparoscopies x2 with laparoscopic sterilization in 1989.  She had an infertility workup prior to this and is uncertain of the diagnosis.  FAMILY HISTORY:               Noncontributory.  PHYSICAL EXAMINATION:  HEENT:                        Grossly within normal limits.  NECK:                         Thyroid gland is not palpably enlarged.  VITAL SIGNS:                  Blood pressure in the office is 138/76.  CHEST:                        Clear to auscultation.  HEART:                        Normal sinus rhythm without murmurs, rubs, or gallops.  ABDOMEN:                      Soft and nontender without appreciable organomegaly or palpable masses.  BREASTS:                      Exam is considered to be normal.  No palpable masses and no nipple discharge and no skin change (recent mammogram in March of 2003 was normal).  PELVIC EXAMINATION:           External genitalia, vagina, and cervix were normal to inspection.  Bimanual reveals the uterus to be anterior and positioned approximately 9-[redacted] weeks gestational size with a tender nodule as noted in Present Illness.  There are no palpable adnexal masses.  The rectum is normal.  Rectovaginal exam confirms the above findings.  EXTREMITIES:                  Without cyanosis, clubbing, or  edema.  ASSESSMENT:                   1. Large uterine leiomyomata, probable                                  degenerating myoma or adenomyosis.                               2. Persistent dysfunctional bleeding,                                  unresponsive to hormonal therapy.  PLAN:                         Laparoscopically-assisted vaginal hysterectomy and bilateral salpingo-oophorectomy. Dictated by:   Freddy Finner, M.D. Attending Physician:  Minette Headland DD:  05/30/01 TD:  05/30/01 Job: [phone]  [id number]

## 2010-06-13 NOTE — Op Note (Signed)
[facility]  Patient:    Leslie Crawford, Leslie Crawford Visit Number: [id number] MRN: [id number]          Service Type: DSU Location: [phone] Attending Physician:  Minette Headland Dictated by:   Freddy Finner, M.D. Proc. Date: 05/31/01 Admit Date:  05/31/2001                             Operative Report  PREOPERATIVE DIAGNOSIS:       Fibroids, dysfunctional bleeding.  POSTOPERATIVE DIAGNOSIS:      Fibroids, dysfunctional bleeding.  OPERATION:                    Laparoscopically assisted vaginal hysterectomy/bilateral salpingo-oophorectomy.  SURGEON:                      Freddy Finner, M.D.  ASSISTANT:                    Juluis Mire, M.D.  ANESTHESIA:                   General endotracheal.  ESTIMATED BLOOD LOSS:         200 cc.  COMPLICATIONS:                None.  DESCRIPTION OF PROCEDURE:  The patient was admitted on the morning of surgery and brought to the operating room and placed under adequate general endotracheal anesthesia, placed in the dorsolithotomy position.  The abdomen, perineum, and vaginal were prepped with Betadine scrub followed by Betadine solution.  The bladder was evacuated with a Robinson catheter.  Hulka tenaculum was attached to the cervix.  Sterile drapes were applied.  Two small incisions were made abdominally, one at the umbilicus and one just above the symphysis.  A 10-mm trocar was introduced through the umbilicus while elevating the anterior abdominal wall manually.  Direct inspection revealed adequate placement without evidence of injury on entry.  The pneumoperitoneum was allowed to accumulate with carbon dioxide gas.  A 5-mm trocar was placed in the lower incision.  A blunt probe, and later grasping forceps, were used in this probe.  Using the Gyrus laparoscopic bipolar cutting forceps, the infundibulopelvic and upper broad ligaments were developed and divided down to the level just above the uterine  arteries.  The uterus itself was abnormal. No other abnormalities were noted within the abdomen including normal appendix.  Attention was then turned vaginally.  Posterior bladed vaginal retractor was placed.  Deaver retractors were placed to retract the vaginal os anteriorly and laterally.  The cervix was grasped with a Christella Hartigan tenaculum. Colpotomy incision was made using Allis to tent the cul-de-sac.  The cervix was circumscribed with a scalpel.  Using the Gyrus ______ forceps, the uterosacrals were taken, sealed, and divided.  Bladder pillars were taken separately.  Cardinal ligament pedicles were taken.  The bladder was carefully advanced off the cervix and lower segment and the anterior peritoneum entered. Vessel pedicles were then taken with the Gyrus system, sealed and divided. The pedicle was taken above the vessels on each side.  The uterus was then cord and delivered through the vagina.  The angles of the vagina were anchored to the uterosacrals with mattress sutures of 0 Monocryl.  The uterosacrals were plicated and posterior peritoneum closed with an interrupted 0 Monocryl suture.  The cuff was closed vertically in a  figure-of-eight with 0 Monocryl. Foley catheter was placed.  Attention was directed abdominally.  A ______ irrigating system was used to develop a ______ port and irrigation was carried out.  Hemostasis was complete.  All irrigating solution was removed from the abdomen.  The instruments were removed.  Gas was allowed to escape. Skin incisions were closed with interrupted subcuticular sutures of 3-0 Dexon and 0.25% plain Marcaine was injected into the incision sites for postoperative analgesia.  Steri-Strips were applied to the lower incision. The patient was awakened and taken to recovery in good condition. Dictated by:   Freddy Finner, M.D. Attending Physician:  Minette Headland DD:  05/31/01 TD:  05/31/01 Job: [id number]

## 2010-06-13 NOTE — Discharge Summary (Signed)
[facility]  Patient:    Leslie Crawford, Leslie Crawford Visit Number: [id number] MRN: [id number]          Service Type: DSU Location: [id number] Attending Physician:  Minette Headland Dictated by:   Freddy Finner, M.D. Admit Date:  05/31/2001 Discharge Date: 06/01/2001                             Discharge Summary  DISCHARGE DIAGNOSES:          1. Uterine leiomyomata.                               2. Menometrorrhagia.                               3. Pelvic pain.  OPERATIVE PROCEDURE:          1. Laparoscopic-assisted vaginal hysterectomy.                               2. Bilateral salpingo-oophorectomy.  CONSULTATIONS:                None.  DISPOSITION:                  The patient is in satisfactory and good condition at the time of her discharge.  ACTIVITY:                     Aggressively increasing physical activity but no vaginal entry.  SPECIAL INSTRUCTIONS:         She is to call for fever, severe pain, or heavy bleeding.  DISCHARGE MEDICATIONS:        Tylox to be taken as needed for postoperative pain, and this can be mixed with ibuprofen.  FOLLOWUP:                     She is to return to the office in two weeks for postoperative followup.  DIET:                         Regular.  Details of present illness, past history, family history, Review of Systems, and physical exam recorded in admission note.  PHYSICAL EXAMINATION:         Remarkable only for pelvic abnormality with enlarged uterus 9 to 10-weeks size.  This had been noted to significantly increase in size over the last three years and was associated with dysfunctional bleeding unresponsive to cyclic hormonal therapy.  LABORATORY DATA:              During this admission, CBC on admission shows hemoglobin 13.7, postoperative hemoglobin 11.6.  Prothrombin time, PTT, and INR were all normal.  urinalysis on admission was normal.  HOSPITAL COURSE:              The patient was  admitted on the morning of surgery.  She was placed in PAS hose.  She was given bolus of Cefotan IV preoperatively.  She was taken to the operating room where the above-described procedure was accomplished without difficulty on intraoperative complications.  Postoperative course was uncomplicated.  By late morning of the first postoperative day, the patient was tolerating a regular diet.  She was  ambulating without difficulty.  She was afebrile.  She was having adequate bowel and bladder function.  She was discharged home with disposition as noted above. Dictated by:   Freddy Finner, M.D. Attending Physician:  Minette Headland DD:  06/01/01 TD:  06/04/01 Job: [id number]

## 2010-08-07 ENCOUNTER — Other Ambulatory Visit: Payer: Self-pay | Admitting: Internal Medicine

## 2010-10-14 ENCOUNTER — Other Ambulatory Visit: Payer: Self-pay | Admitting: Oncology

## 2010-10-14 ENCOUNTER — Encounter (HOSPITAL_BASED_OUTPATIENT_CLINIC_OR_DEPARTMENT_OTHER): Payer: 59 | Admitting: Oncology

## 2010-10-14 DIAGNOSIS — C50219 Malignant neoplasm of upper-inner quadrant of unspecified female breast: Secondary | ICD-10-CM

## 2010-10-14 LAB — COMPREHENSIVE METABOLIC PANEL
ALT: 22 U/L (ref 0–35)
AST: 25 U/L (ref 0–37)
CO2: 28 mEq/L (ref 19–32)
Chloride: 101 mEq/L (ref 96–112)
Creatinine, Ser: 0.81 mg/dL (ref 0.50–1.10)
Sodium: 138 mEq/L (ref 135–145)
Total Bilirubin: 0.4 mg/dL (ref 0.3–1.2)
Total Protein: 6.9 g/dL (ref 6.0–8.3)

## 2010-10-14 LAB — CBC WITH DIFFERENTIAL/PLATELET
Basophils Absolute: 0 10*3/uL (ref 0.0–0.1)
Eosinophils Absolute: 0.2 10*3/uL (ref 0.0–0.5)
HCT: 39.5 % (ref 34.8–46.6)
HGB: 13.8 g/dL (ref 11.6–15.9)
MONO#: 0.7 10*3/uL (ref 0.1–0.9)
NEUT#: 4.6 10*3/uL (ref 1.5–6.5)
NEUT%: 64.4 % (ref 38.4–76.8)
RDW: 13.2 % (ref 11.2–14.5)
WBC: 7.1 10*3/uL (ref 3.9–10.3)
lymph#: 1.7 10*3/uL (ref 0.9–3.3)

## 2010-10-14 LAB — CANCER ANTIGEN 27.29: CA 27.29: 14 U/mL (ref 0–39)

## 2010-10-20 ENCOUNTER — Ambulatory Visit
Admission: RE | Admit: 2010-10-20 | Discharge: 2010-10-20 | Disposition: A | Discharge status: Home / Self Care | Payer: 59 | Source: Ambulatory Visit | Attending: Oncology | Admitting: Oncology

## 2010-10-20 DIAGNOSIS — Z9889 Other specified postprocedural states: Secondary | ICD-10-CM

## 2010-11-10 ENCOUNTER — Encounter: Payer: 59 | Admitting: Oncology

## 2011-01-22 ENCOUNTER — Ambulatory Visit (INDEPENDENT_AMBULATORY_CARE_PROVIDER_SITE_OTHER): Payer: 59

## 2011-01-22 DIAGNOSIS — J311 Chronic nasopharyngitis: Secondary | ICD-10-CM

## 2011-01-22 DIAGNOSIS — R059 Cough, unspecified: Secondary | ICD-10-CM

## 2011-01-22 DIAGNOSIS — R05 Cough: Secondary | ICD-10-CM

## 2011-03-09 ENCOUNTER — Encounter: Payer: Self-pay | Admitting: Gastroenterology

## 2011-03-18 ENCOUNTER — Ambulatory Visit: Payer: 59 | Admitting: Gastroenterology

## 2011-03-19 ENCOUNTER — Ambulatory Visit: Payer: 59 | Admitting: Family Medicine

## 2011-03-19 ENCOUNTER — Other Ambulatory Visit: Payer: Self-pay | Admitting: Internal Medicine

## 2011-03-20 NOTE — Telephone Encounter (Signed)
Needs ov

## 2011-04-02 ENCOUNTER — Telehealth: Payer: Self-pay | Admitting: Oncology

## 2011-04-02 NOTE — Telephone Encounter (Signed)
Pt is aware of her April 2013 appts

## 2011-04-07 ENCOUNTER — Ambulatory Visit (INDEPENDENT_AMBULATORY_CARE_PROVIDER_SITE_OTHER): Payer: 59 | Admitting: Family Medicine

## 2011-04-07 ENCOUNTER — Encounter: Payer: Self-pay | Admitting: Family Medicine

## 2011-04-07 VITALS — BP 115/68 | HR 74 | Temp 98.1°F | Resp 16 | Ht 64.5 in | Wt 166.0 lb

## 2011-04-07 DIAGNOSIS — F341 Dysthymic disorder: Secondary | ICD-10-CM

## 2011-04-07 DIAGNOSIS — F419 Anxiety disorder, unspecified: Secondary | ICD-10-CM

## 2011-04-07 MED ORDER — CITALOPRAM HYDROBROMIDE 20 MG PO TABS: ORAL_TABLET | ORAL | Status: DC

## 2011-04-07 MED ORDER — CITALOPRAM HYDROBROMIDE 20 MG PO TABS: 20.0000 mg | ORAL_TABLET | Freq: Every day | ORAL | Status: DC

## 2011-04-07 NOTE — Progress Notes (Signed)
  Subjective:    Patient ID: Leslie Crawford, female    DOB: 1950-02-17, 61 y.o.   MRN: [id number]  HPI   This pleasant 61 y.o Cauc female returns for RF: Citalopram which works well for treatment  of Anxiety and Depression since 2010 ( coinciding with diagnosis of Breast Cancer). She is married  for 30+ years to husband who is a [place] Veteran and suffers with some form of anxiety and/or  depression. [facility] and the pt hopes that he will open up  about his experiences during the War and accept treatment for his insomnia. In the past, the pt has  taken Generic Ambien which was ineffective and Remeron caused her to have excessive hunger.   Re; Breast Cancer- followed at [facility] by Dr. Donnie Coffin every 6 months (no recurrence).    Review of Systems Noncontributory     Objective:   Physical Exam  Nursing note and vitals reviewed. Constitutional: She is oriented to person, place, and time. She appears well-developed and well-nourished. No distress.  HENT:  Head: Normocephalic and atraumatic.  Eyes: Conjunctivae and EOM are normal. No scleral icterus.  Pulmonary/Chest: Effort normal. No respiratory distress.  Neurological: She is alert and oriented to person, place, and time. No cranial nerve deficit.  Psychiatric: She has a normal mood and affect. Her behavior is normal. Judgment and thought content normal.          Assessment & Plan:   1. Anxiety disorder   citalopram (CELEXA) 20 MG tablet   1 1/2 tabs hs  Pt given some print info to share with husband re: Insomnia

## 2011-04-07 NOTE — Patient Instructions (Signed)

## 2011-04-15 ENCOUNTER — Other Ambulatory Visit: Payer: Self-pay | Admitting: Internal Medicine

## 2011-04-15 ENCOUNTER — Other Ambulatory Visit: Payer: Self-pay | Admitting: *Deleted

## 2011-05-11 ENCOUNTER — Other Ambulatory Visit: Payer: Self-pay | Admitting: Oncology

## 2011-05-11 ENCOUNTER — Other Ambulatory Visit (HOSPITAL_BASED_OUTPATIENT_CLINIC_OR_DEPARTMENT_OTHER): Payer: 59 | Admitting: Lab

## 2011-05-11 DIAGNOSIS — C50919 Malignant neoplasm of unspecified site of unspecified female breast: Secondary | ICD-10-CM

## 2011-05-12 LAB — CBC WITH DIFFERENTIAL/PLATELET
Basophils Absolute: 0 10*3/uL (ref 0.0–0.1)
Eosinophils Absolute: 0.2 10*3/uL (ref 0.0–0.5)
HGB: 13.6 g/dL (ref 11.6–15.9)
MONO#: 0.5 10*3/uL (ref 0.1–0.9)
NEUT#: 4.8 10*3/uL (ref 1.5–6.5)
RBC: 4.33 10*6/uL (ref 3.70–5.45)
RDW: 12.8 % (ref 11.2–14.5)
WBC: 7.3 10*3/uL (ref 3.9–10.3)
lymph#: 1.7 10*3/uL (ref 0.9–3.3)
nRBC: 0 % (ref 0–0)

## 2011-05-13 LAB — COMPREHENSIVE METABOLIC PANEL
ALT: 15 U/L (ref 0–35)
Albumin: 4.1 g/dL (ref 3.5–5.2)
CO2: 25 mEq/L (ref 19–32)
Glucose, Bld: 95 mg/dL (ref 70–99)
Potassium: 3.8 mEq/L (ref 3.5–5.3)
Sodium: 138 mEq/L (ref 135–145)
Total Protein: 6.2 g/dL (ref 6.0–8.3)

## 2011-05-13 LAB — VITAMIN D 25 HYDROXY (VIT D DEFICIENCY, FRACTURES): Vit D, 25-Hydroxy: 38 ng/mL (ref 30–89)

## 2011-05-14 ENCOUNTER — Ambulatory Visit: Payer: 59 | Admitting: Oncology

## 2011-05-21 ENCOUNTER — Ambulatory Visit (HOSPITAL_BASED_OUTPATIENT_CLINIC_OR_DEPARTMENT_OTHER): Payer: 59 | Admitting: Oncology

## 2011-05-21 ENCOUNTER — Telehealth: Payer: Self-pay | Admitting: Oncology

## 2011-05-21 VITALS — BP 123/77 | HR 69 | Temp 98.0°F | Ht 64.5 in | Wt 161.4 lb

## 2011-05-21 DIAGNOSIS — C50919 Malignant neoplasm of unspecified site of unspecified female breast: Secondary | ICD-10-CM

## 2011-05-21 DIAGNOSIS — Z171 Estrogen receptor negative status [ER-]: Secondary | ICD-10-CM

## 2011-05-21 NOTE — Telephone Encounter (Signed)
gv pt appt schedule for oct.  °

## 2011-05-22 ENCOUNTER — Other Ambulatory Visit: Payer: Self-pay | Admitting: *Deleted

## 2011-10-15 ENCOUNTER — Encounter: Payer: Self-pay | Admitting: Family Medicine

## 2011-10-15 ENCOUNTER — Ambulatory Visit (INDEPENDENT_AMBULATORY_CARE_PROVIDER_SITE_OTHER): Payer: 59 | Admitting: Family Medicine

## 2011-10-15 VITALS — BP 121/76 | HR 67 | Temp 98.2°F | Resp 16 | Ht 64.0 in | Wt 167.0 lb

## 2011-10-15 DIAGNOSIS — E785 Hyperlipidemia, unspecified: Secondary | ICD-10-CM

## 2011-10-15 DIAGNOSIS — F411 Generalized anxiety disorder: Secondary | ICD-10-CM

## 2011-10-15 DIAGNOSIS — F419 Anxiety disorder, unspecified: Secondary | ICD-10-CM

## 2011-10-15 MED ORDER — CITALOPRAM HYDROBROMIDE 20 MG PO TABS: ORAL_TABLET | ORAL | Status: DC

## 2011-10-15 NOTE — Patient Instructions (Signed)
Lipid Blood Tests Blood lipids are fats found in the circulation. Cholesterol and triglycerides are the two main lipids measured for determining your risk of getting heart and blood vessel diseases. The cholesterol in the blood is attached to two different molecules called lipoproteins. HDL is the beneficial high density lipoprotein cholesterol which reduces coronary risk. Low density lipoprotein cholesterol, the LDL, carries an increased risk for heart and vascular disease when it is high. Most of the body's fat tissue is in the form of triglycerides. Medical conditions that elevate the blood triglyceride level include diabetes, thyroid, kidney, and liver diseases.  A lipid panel usually includes the total cholesterol, LDL, and HDL blood levels. For best results, you should fast overnight before the blood tests are drawn. The following risk factors are generally accepted for different lipids:  LDL - Below 100 means low risk, 130-160 borderline risk, 160-190 high risk, above 190 is considered very high risk.   HDL - Below 40 means high risk, 40-60 average risk, 60 and higher means a reduced risk for heart and blood vessel diseases.   Total cholesterol - Below 200 means low risk, 200-240 is borderline risk, above 240 is higher risk.   Triglycerides - Below 200 means low risk, 200-400 borderline risk, above 400 means increased risk.  Many factors contribute to lipid levels including genetics, diet, exercise, body fat and medications. Reducing saturated fats in the diet and increasing fiber with fruits, vegetables and whole grains have been shown to improve blood lipids. Drugs may be prescribed to lower lipids if diet and exercise alone do not help bring the cholesterol levels under control. Document Released: 02/20/2004 Document Revised: 01/01/2011 Document Reviewed: 01/12/2005 [facility] Patient Information 2012 [place], [place].Cholesterol Control Diet Cholesterol levels in your body are determined  significantly by your diet. Cholesterol levels may also be related to heart disease. The following material helps to explain this relationship and discusses what you can do to help keep your heart healthy. Not all cholesterol is bad. Low-density lipoprotein (LDL) cholesterol is the "bad" cholesterol. It may cause fatty deposits to build up inside your arteries. High-density lipoprotein (HDL) cholesterol is "good." It helps to remove the "bad" LDL cholesterol from your blood. Cholesterol is a very important risk factor for heart disease. Other risk factors are high blood pressure, smoking, stress, heredity, and weight. The heart muscle gets its supply of blood through the coronary arteries. If your LDL cholesterol is high and your HDL cholesterol is low, you are at risk for having fatty deposits build up in your coronary arteries. This leaves less room through which blood can flow. Without sufficient blood and oxygen, the heart muscle cannot function properly and you may feel chest pains (angina pectoris). When a coronary artery closes up entirely, a part of the heart muscle may die, causing a heart attack (myocardial infarction). CHECKING CHOLESTEROL When your caregiver sends your blood to a lab to be analyzed for cholesterol, a complete lipid (fat) profile may be done. With this test, the total amount of cholesterol and levels of LDL and HDL are determined. Triglycerides are a type of fat that circulates in the blood and can also be used to determine heart disease risk. The list below describes what the numbers should be: Test: Total Cholesterol.  Less than 200 mg/dl.  Test: LDL "bad cholesterol."  Less than 100 mg/dl.   Less than 70 mg/dl if you are at very high risk of a heart attack or sudden cardiac death.  Test: HDL "good  cholesterol."  Greater than 50 mg/dl for women.   Greater than 40 mg/dl for men.  Test: Triglycerides.  Less than 150 mg/dl.  CONTROLLING CHOLESTEROL WITH DIET Although  exercise and lifestyle factors are important, your diet is key. That is because certain foods are known to raise cholesterol and others to lower it. The goal is to balance foods for their effect on cholesterol and more importantly, to replace saturated and trans fat with other types of fat, such as monounsaturated fat, polyunsaturated fat, and omega-3 fatty acids. On average, a person should consume no more than 15 to 17 g of saturated fat daily. Saturated and trans fats are considered "bad" fats, and they will raise LDL cholesterol. Saturated fats are primarily found in animal products such as meats, butter, and cream. However, that does not mean you need to sacrifice all your favorite foods. Today, there are good tasting, low-fat, low-cholesterol substitutes for most of the things you like to eat. Choose low-fat or nonfat alternatives. Choose round or loin cuts of red meat, since these types of cuts are lowest in fat and cholesterol. Chicken (without the skin), fish, veal, and ground [place] breast are excellent choices. Eliminate fatty meats, such as hot dogs and salami. Even shellfish have little or no saturated fat. Have a 3 oz (85 g) portion when you eat lean meat, poultry, or fish. Trans fats are also called "partially hydrogenated oils." They are oils that have been scientifically manipulated so that they are solid at room temperature resulting in a longer shelf life and improved taste and texture of foods in which they are added. Trans fats are found in stick margarine, some tub margarines, cookies, crackers, and baked goods.  When baking and cooking, oils are an excellent substitute for butter. The monounsaturated oils are especially beneficial since it is believed they lower LDL and raise HDL. The oils you should avoid entirely are saturated tropical oils, such as coconut and palm.  Remember to eat liberally from food groups that are naturally free of saturated and trans fat, including fish, fruit,  vegetables, beans, grains (barley, rice, couscous, bulgur wheat), and pasta (without cream sauces).  IDENTIFYING FOODS THAT LOWER CHOLESTEROL  Soluble fiber may lower your cholesterol. This type of fiber is found in fruits such as apples, vegetables such as broccoli, potatoes, and carrots, legumes such as beans, peas, and lentils, and grains such as barley. Foods fortified with plant sterols (phytosterol) may also lower cholesterol. You should eat at least 2 g per day of these foods for a cholesterol lowering effect.  Read package labels to identify low-saturated fats, trans fats free, and low-fat foods at the supermarket. Select cheeses that have only 2 to 3 g saturated fat per ounce. Use a heart-healthy tub margarine that is free of trans fats or partially hydrogenated oil. When buying baked goods (cookies, crackers), avoid partially hydrogenated oils. Breads and muffins should be made from whole grains (whole-wheat or whole oat flour, instead of "flour" or "enriched flour"). Buy non-creamy canned soups with reduced salt and no added fats.  FOOD PREPARATION TECHNIQUES  Never deep-fry. If you must fry, either stir-fry, which uses very little fat, or use non-stick cooking sprays. When possible, broil, bake, or roast meats, and steam vegetables. Instead of dressing vegetables with butter or margarine, use lemon and herbs, applesauce and cinnamon (for squash and sweet potatoes), nonfat yogurt, salsa, and low-fat dressings for salads.  LOW-SATURATED FAT / LOW-FAT FOOD SUBSTITUTES Meats / Saturated Fat (g)  Avoid:  Steak, marbled (3 oz/85 g) / 11 g   Choose: Steak, lean (3 oz/85 g) / 4 g   Avoid: Hamburger (3 oz/85 g) / 7 g   Choose: Hamburger, lean (3 oz/85 g) / 5 g   Avoid: Ham (3 oz/85 g) / 6 g   Choose: Ham, lean cut (3 oz/85 g) / 2.4 g   Avoid: Chicken, with skin, dark meat (3 oz/85 g) / 4 g   Choose: Chicken, skin removed, dark meat (3 oz/85 g) / 2 g   Avoid: Chicken, with skin, light meat  (3 oz/85 g) / 2.5 g   Choose: Chicken, skin removed, light meat (3 oz/85 g) / 1 g  Dairy / Saturated Fat (g)  Avoid: Whole milk (1 cup) / 5 g   Choose: Low-fat milk, 2% (1 cup) / 3 g   Choose: Low-fat milk, 1% (1 cup) / 1.5 g   Choose: Skim milk (1 cup) / 0.3 g   Avoid: Hard cheese (1 oz/28 g) / 6 g   Choose: Skim milk cheese (1 oz/28 g) / 2 to 3 g   Avoid: Cottage cheese, 4% fat (1 cup) / 6.5 g   Choose: Low-fat cottage cheese, 1% fat (1 cup) / 1.5 g   Avoid: Ice cream (1 cup) / 9 g   Choose: Sherbet (1 cup) / 2.5 g   Choose: Nonfat frozen yogurt (1 cup) / 0.3 g   Choose: Frozen fruit bar / trace   Avoid: Whipped cream (1 tbs) / 3.5 g   Choose: Nondairy whipped topping (1 tbs) / 1 g  Condiments / Saturated Fat (g)  Avoid: Mayonnaise (1 tbs) / 2 g   Choose: Low-fat mayonnaise (1 tbs) / 1 g   Avoid: Butter (1 tbs) / 7 g   Choose: Extra light margarine (1 tbs) / 1 g   Avoid: Coconut oil (1 tbs) / 11.8 g   Choose: Olive oil (1 tbs) / 1.8 g   Choose: Corn oil (1 tbs) / 1.7 g   Choose: Safflower oil (1 tbs) / 1.2 g   Choose: Sunflower oil (1 tbs) / 1.4 g   Choose: Soybean oil (1 tbs) / 2.4 g   Choose: Canola oil (1 tbs) / 1 g  Document Released: 01/12/2005 Document Revised: 01/01/2011 Document Reviewed: 07/03/2010 [facility] Patient Information 2012 [place], [place].

## 2011-10-15 NOTE — Addendum Note (Signed)
Addended by: Elvina Sidle on: 10/15/2011 10:32 AM   Modules accepted: Orders, Level of Service

## 2011-10-15 NOTE — Progress Notes (Addendum)
61 yo woman here to review her cholesterol.  She gets her flu shot at [facility] Had colonoscopy 3 years ago.  Objective:   Total choles:  287 LDL:  175 HDL: 88 Triglycerides:  119  Assessment:  Patient has no other cardiovascular risk factors and she is a cancer survivor so I don't think she should take statins.  Plan:  Krill oil, recheck 1 year  Also, she needs refill on citalopram.  She reports that she is doing well and takes 1 and 1/2 tablets daily  1. Hyperlipidemia    2. Anxiety  citalopram (CELEXA) 20 MG tablet   Patient will increase her exercise and work on losing weight.

## 2011-11-10 ENCOUNTER — Other Ambulatory Visit: Payer: Self-pay | Admitting: Obstetrics and Gynecology

## 2011-11-13 ENCOUNTER — Other Ambulatory Visit (HOSPITAL_BASED_OUTPATIENT_CLINIC_OR_DEPARTMENT_OTHER): Payer: 59 | Admitting: Lab

## 2011-11-13 DIAGNOSIS — C50919 Malignant neoplasm of unspecified site of unspecified female breast: Secondary | ICD-10-CM

## 2011-11-13 LAB — CBC WITH DIFFERENTIAL/PLATELET
BASO%: 0.7 % (ref 0.0–2.0)
EOS%: 3.8 % (ref 0.0–7.0)
LYMPH%: 24.1 % (ref 14.0–49.7)
MCH: 31.6 pg (ref 25.1–34.0)
MCHC: 34.2 g/dL (ref 31.5–36.0)
MONO#: 0.5 10*3/uL (ref 0.1–0.9)
RBC: 4.25 10*6/uL (ref 3.70–5.45)
WBC: 7.3 10*3/uL (ref 3.9–10.3)
lymph#: 1.8 10*3/uL (ref 0.9–3.3)

## 2011-11-13 LAB — COMPREHENSIVE METABOLIC PANEL (CC13)
ALT: 36 U/L (ref 0–55)
AST: 28 U/L (ref 5–34)
CO2: 25 mEq/L (ref 22–29)
Calcium: 9.7 mg/dL (ref 8.4–10.4)
Chloride: 104 mEq/L (ref 98–107)
Creatinine: 0.8 mg/dL (ref 0.6–1.1)
Sodium: 138 mEq/L (ref 136–145)
Total Bilirubin: 0.7 mg/dL (ref 0.20–1.20)
Total Protein: 6.7 g/dL (ref 6.4–8.3)

## 2011-11-13 LAB — LACTATE DEHYDROGENASE (CC13): LDH: 167 U/L (ref 125–220)

## 2011-11-14 LAB — CANCER ANTIGEN 27.29: CA 27.29: 9 U/mL (ref 0–39)

## 2011-11-16 ENCOUNTER — Ambulatory Visit
Admission: RE | Admit: 2011-11-16 | Discharge: 2011-11-16 | Disposition: A | Discharge status: Home / Self Care | Payer: 59 | Source: Ambulatory Visit | Attending: Oncology | Admitting: Oncology

## 2011-11-16 DIAGNOSIS — Z853 Personal history of malignant neoplasm of breast: Secondary | ICD-10-CM

## 2011-11-16 DIAGNOSIS — C50919 Malignant neoplasm of unspecified site of unspecified female breast: Secondary | ICD-10-CM

## 2011-11-20 ENCOUNTER — Ambulatory Visit (HOSPITAL_BASED_OUTPATIENT_CLINIC_OR_DEPARTMENT_OTHER): Payer: 59 | Admitting: Oncology

## 2011-11-20 ENCOUNTER — Telehealth: Payer: Self-pay | Admitting: Oncology

## 2011-11-20 VITALS — BP 129/82 | HR 64 | Temp 98.3°F | Resp 20 | Ht 64.0 in | Wt 167.8 lb

## 2011-11-20 DIAGNOSIS — Z171 Estrogen receptor negative status [ER-]: Secondary | ICD-10-CM

## 2011-11-20 DIAGNOSIS — C50919 Malignant neoplasm of unspecified site of unspecified female breast: Secondary | ICD-10-CM

## 2011-11-20 DIAGNOSIS — C50219 Malignant neoplasm of upper-inner quadrant of unspecified female breast: Secondary | ICD-10-CM

## 2011-11-20 NOTE — Progress Notes (Signed)
Hematology and Oncology Follow Up Visit  Leslie Crawford [id number] December 10, 1950 61 y.o. 11/20/2011 4:28 PM   DIAGNOSIS:   Encounter Diagnosis  Name Primary?  . 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 PAST THERAPY:  As above   Interim History:  Patient returns for followup. She is doing well. 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 She had a mammogram 2 days ago which was normal. She feels otherwise well. Medications have been reviewed.  Medications: I have reviewed the patient's current medications.  Allergies: No Known Allergies  Past Medical History, Surgical history, Social history, and Family History were reviewed and updated.  Review of Systems: Constitutional:  Negative for fever, chills, night sweats, anorexia, weight loss, pain. Cardiovascular: no chest pain or dyspnea on exertion Respiratory: negative Neurological: negative Dermatological: negative ENT: negative Skin Gastrointestinal: negative Genito-Urinary: negative Hematological and Lymphatic: negative Breast: negative Musculoskeletal: negative Remaining ROS negative.  Physical Exam:  Blood pressure 129/82, pulse 64, temperature 98.3 F (36.8 C), temperature source Oral, resp. rate 20, height 5\' 4"  (1.626 m), weight 167 lb 12.8 oz (76.114 kg).  ECOG: 0HEENT:  Sclerae anicteric, conjunctivae pink.  Oropharynx clear.  No mucositis or candidiasis.  Nodes:  No cervical, supraclavicular, or axillary lymphadenopathy palpated.  Breast Exam:  Right breast is benign.  No masses, discharge, skin change, or nipple inversion.  Left breast is benign.  No masses, discharge, skin change, or nipple inversion..  Lungs:  Clear to auscultation bilaterally.  No crackles, rhonchi, or wheezes.  Heart:   Regular rate and rhythm.  Abdomen:  Soft, nontender.  Positive bowel sounds.  No organomegaly or masses palpated.  Musculoskeletal:  No focal spinal tenderness to palpation.  Extremities:  Benign.  No peripheral edema or cyanosis.  Skin:  Benign.  Neuro:  Nonfocal.       Lab Results: Lab Results  Component Value Date   WBC 7.3 11/13/2011   HGB 13.4 11/13/2011   HCT 39.3 11/13/2011   MCV 92.3 11/13/2011   PLT 254 11/13/2011     Chemistry      Component Value Date/Time   NA 138 11/13/2011 1604   NA 138 05/11/2011 1600   K 4.0 11/13/2011 1604   K 3.8 05/11/2011 1600   CL 104 11/13/2011 1604   CL 105 05/11/2011 1600   CO2 25 11/13/2011 1604   CO2 25 05/11/2011 1600   BUN 14.0 11/13/2011 1604   BUN 23 05/11/2011 1600   CREATININE 0.8 11/13/2011 1604   CREATININE 0.71 05/11/2011 1600      Component Value Date/Time   CALCIUM 9.7 11/13/2011 1604   CALCIUM 9.5 05/11/2011 1600   ALKPHOS 85 11/13/2011 1604   ALKPHOS 68 05/11/2011 1600   AST 28 11/13/2011 1604   AST 22 05/11/2011 1600   ALT 36 11/13/2011 1604   ALT 15 05/11/2011 1600   BILITOT 0.70 11/13/2011 1604   BILITOT 0.7 05/11/2011 1600       Radiological Studies:  Mm Digital Diagnostic Bilat  11/16/2011  *RADIOLOGY REPORT*  Clinical Data:  Patient presents for a bilateral diagnostic mammogram due to a history of a prior left malignant lumpectomy 2010.  DIGITAL DIAGNOSTIC BILATERAL MAMMOGRAM WITH CAD  Comparison:  12/02/2007, 10/17/2009, 10/20/2010 and 04/15/2010  Findings:  Exam demonstrates scattered fibroglandular densities. There  are stable post lumpectomy changes of the left retroareolar region.  Remainder of the exam is unchanged. Mammographic images were processed with CAD.  IMPRESSION: Stable post lumpectomy changes of the left breast.  RECOMMENDATION: Recommend continued annual bilateral diagnostic mammographic evaluation.  BI-RADS CATEGORY 2:  Benign finding(s).   Original Report Authenticated By: Elba Barman, M.D.       IMPRESSIONS AND PLAN: A 61 y.o. female with   She negative breast cancer on on regular followup. She is now 2 years out from completion of treatment. There is no clinical evidence of recurrence. I will see her in 6 months time for followup. She appears to be doing well.  Spent more than half the time coordinating care, as well as discussion of BMI and its implications.      [name] 10/25/20134:28 PM Cell [phone]

## 2011-11-20 NOTE — Telephone Encounter (Signed)
gve the pt her April 2014 appt calendar °

## 2011-11-23 ENCOUNTER — Telehealth: Payer: Self-pay | Admitting: Oncology

## 2011-11-23 NOTE — Telephone Encounter (Signed)
gve the pt her April 2014 appt calendar °

## 2012-02-17 ENCOUNTER — Telehealth: Payer: Self-pay | Admitting: Oncology

## 2012-02-17 NOTE — Telephone Encounter (Signed)
The patient called to request Dr. Darnelle Catalan for her follow ups as Dr. Donnie Coffin is no longer here.   She is very upset about his departure.   She is scheduled to be seen in April and I explained to her that she will be receiving a call soon to be rescheduled. I emailed [name] her request.

## 2012-02-24 NOTE — Progress Notes (Signed)
ID: Leslie Crawford  DOB: 1951-01-20  MR#: [id number]  CSN#: [id number] DOS: 05/21/11  PROBLEM:  History of stage II triple negative breast cancer status post lumpectomy and sentinel lymph node dissection, status post six cycles of q. 3 week TAC and regional therapy to left breast, completed 05/29/2009.  Interval History:   Leslie Crawford returns for f/u, without any problems or complaints. Leslie Crawford has not had any intercurrent illness. Medications have not changed.  ROS:  14 point ROS was negative  No Known Allergies  Current Outpatient Prescriptions  Medication Sig Dispense Refill  . aspirin 81 MG tablet Take 81 mg by mouth daily.      . Biotin 10 MG TABS Take by mouth.      . magnesium 30 MG tablet Take 30 mg by mouth 2 (two) times daily.      . citalopram (CELEXA) 20 MG tablet 1.5 tabs daily  135 tablet  3     Objective:  Filed Vitals:   05/21/11 1405  BP: 123/77  Pulse: 69  Temp: 98 F (36.7 C)    BMI: Body mass index is 27.28 kg/(m^2).   ECOG FS:  Physical Exam:   Sclerae unicteric  Oropharynx clear  No peripheral adenopathy  Lungs clear -- no rales or rhonchi  Heart regular rate and rhythm  Abdomen benign  MSK no focal spinal tenderness, no peripheral edema  Neuro nonfocal  Breast exam: both wnl, both axillae are negative.  Lab Results:      Chemistry      Component Value Date/Time   NA 138 11/13/2011 1604   NA 138 05/11/2011 1600   K 4.0 11/13/2011 1604   K 3.8 05/11/2011 1600   CL 104 11/13/2011 1604   CL 105 05/11/2011 1600   CO2 25 11/13/2011 1604   CO2 25 05/11/2011 1600   BUN 14.0 11/13/2011 1604   BUN 23 05/11/2011 1600   CREATININE 0.8 11/13/2011 1604   CREATININE 0.71 05/11/2011 1600      Component Value Date/Time   CALCIUM 9.7 11/13/2011 1604   CALCIUM 9.5 05/11/2011 1600   ALKPHOS 85 11/13/2011 1604   ALKPHOS 68 05/11/2011 1600   AST 28 11/13/2011 1604   AST 22 05/11/2011 1600   ALT 36 11/13/2011 1604   ALT 15 05/11/2011 1600   BILITOT 0.70 11/13/2011 1604    BILITOT 0.7 05/11/2011 1600       Lab Results  Component Value Date   WBC 7.3 11/13/2011   HGB 13.4 11/13/2011   HCT 39.3 11/13/2011   MCV 92.3 11/13/2011   PLT 254 11/13/2011   NEUTROABS 4.7 11/13/2011    Studies/Results:  No results found.  Assessment: 62 yo with hx of TN breast ca, s/p TAC x6 , 3rd yr of f/u, currently NED.     Plan: Leslie Crawford will be seen in 6 months, mammogram in 10/13.        Madelina Sanda 02/24/2012

## 2012-03-18 ENCOUNTER — Telehealth: Payer: Self-pay | Admitting: *Deleted

## 2012-03-18 NOTE — Telephone Encounter (Signed)
Pt request Dr. Darnelle Catalan.  Confirmed appt date and time of 05/24/12 at 3:00 to see Larina Bras, NP.

## 2012-03-28 ENCOUNTER — Encounter: Payer: Self-pay | Admitting: *Deleted

## 2012-05-09 ENCOUNTER — Encounter: Payer: Self-pay | Admitting: *Deleted

## 2012-05-09 ENCOUNTER — Other Ambulatory Visit: Payer: Self-pay | Admitting: Oncology

## 2012-05-09 NOTE — Progress Notes (Signed)
[place]ax[place] release for [place] go [place] and called pt to make her aware.

## 2012-05-17 ENCOUNTER — Other Ambulatory Visit: Payer: 59 | Admitting: Lab

## 2012-05-24 ENCOUNTER — Ambulatory Visit (HOSPITAL_BASED_OUTPATIENT_CLINIC_OR_DEPARTMENT_OTHER): Payer: 59 | Admitting: Family

## 2012-05-24 ENCOUNTER — Ambulatory Visit: Payer: 59 | Admitting: Oncology

## 2012-05-24 ENCOUNTER — Encounter: Payer: Self-pay | Admitting: Family

## 2012-05-24 VITALS — BP 136/79 | HR 62 | Temp 98.0°F | Resp 20 | Ht 64.0 in | Wt 170.3 lb

## 2012-05-24 DIAGNOSIS — Z853 Personal history of malignant neoplasm of breast: Secondary | ICD-10-CM

## 2012-05-24 DIAGNOSIS — C50219 Malignant neoplasm of upper-inner quadrant of unspecified female breast: Secondary | ICD-10-CM

## 2012-05-24 DIAGNOSIS — C50912 Malignant neoplasm of unspecified site of left female breast: Secondary | ICD-10-CM

## 2012-05-24 NOTE — Patient Instructions (Addendum)
Please contact us at [phone] if you have any questions or concerns. 

## 2012-05-24 NOTE — Progress Notes (Signed)
[facility]  [phone] [phone]  OFFICE PROGRESS NOTE   ID: Leslie Crawford   DOB: [birth year]  MR#: [id number]  JYN#:[id number]   PCP: Leslie Ora, MD GYN: Leslie Crawford, M.D. SU: Leslie Crawford, M.D. RAD ONC: Leslie Crawford, M.D. Other MD: Leslie Sidle, MD   HISTORY OF PRESENT ILLNESS: From Dr. Theron Arista Crawford's new patient evaluation note dated 11/14/2008: "This is a delightful 62 year old woman from [place] here today with her husband, Leslie Crawford, for discussion of evaluation and treatment of breast cancer.  [name] has been in good health.  She does have annual screening mammograms.  She underwent a screening mammogram at [facility].  Possible mass left breast was identified.  Diagnostic view with ultrasound performed 10/16/2008 showed a suspicious mass in the left breast.  This was apparently palpable as well located at 11 o'clock position 3 to 4 cm from the nipple.  This measured 1.3 x 1.2 x 1.2 cm.  A biopsy was obtained the same day and this revealed an ER/PR negative, HER-2 negative breast cancer.  Proliferative index was 91%.  This is an invasive ductal cancer.  The patient underwent a preoperative MRI scan on 10/24/2008.  This showed a subareolar breast mass 11 o'clock position measuring 2 x 1.3 x 1.5 cm.  There is a left lower axillary lymph node, which appeared to be prominent.  Ultimately, the patient underwent a lumpectomy and sentinel lymph node evaluation on 10/31/2008.  This did show a 1.9 cm grade 3/3 invasive ductal cancer.  Surgical margins were clear.  Lymphovascular invasion was present.  In one lymph node, there was extracapsular extension.  Margins were free of disease tumor with the closest margin being 0.5 cm.  The patient has had an unremarkable postoperative course."  Her subsequent history is as detailed below.   INTERVAL HISTORY: Dr. 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  The patient was last seen by Dr. Donnie Crawford on 11/20/2011.   Since her last office visit, the patient has been doing relatively well.  She is establishing herself with Dr. Darrall Crawford service today.   REVIEW OF SYSTEMS: A 10 point review of systems was completed and is negative except  hot flashes/night sweats/vaginal dryness all of which the patient states is tolerable.  The patient denies any other symptomatology.   PAST MEDICAL HISTORY: Past Medical History  Diagnosis Date  . Anxiety   . Depression   . Breast cancer 2010    S/P Chemo/RadTx- Dr. Pierce Crawford  . Osteopenia     PAST SURGICAL HISTORY: Past Surgical History  Procedure Laterality Date  . Tonsillectomy and adenoidectomy    . Breast lumpectomy  10/29/2008    Negative markers- 1 positive node  . Abdominal hysterectomy      Total hysterectomy with BSO  Includes hysterectomy with bilateral oophorectomy in 2003 for fibroids.  She has a history of tonsillectomy.    FAMILY HISTORY Family History  Problem Relation Age of Onset  . Cancer Mother     Throat cancer  . Cancer Father     Stomach cancer  Parents deceased; father from stomach cancer, mother from throat cancer.  She has a paternal grandmother who had breast cancer who had a mastectomy.   GYNECOLOGIC HISTORY: She is G2, P64, menarche age 16, parity age 40.  She was on hormone replacement therapy after hysterectomy for 5 years.   SOCIAL HISTORY:  Leslie Crawford has been married to her husband Leslie Crawford for over 40 years .  They have one adult son who lives in [place], [place] . Her husband is retired.  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  In her spare time she enjoys traveling, spending time with friends, gardening, and cooking.      ADVANCED DIRECTIVES: Not on file  HEALTH MAINTENANCE: History  Substance Use Topics  .  Smoking status: Former Games developer  . Smokeless tobacco: Never Used  . Alcohol Use: 3.0 oz/week    5 Cans of beer per week    Colonoscopy: 08/24/2007 PAP: 11/10/2011 Bone density: Not on file Lipid panel: Not on file  No Known Allergies  Current Outpatient Prescriptions  Medication Sig Dispense Refill  . aspirin 81 MG tablet Take 81 mg by mouth daily.      . Biotin 10 MG TABS Take 1 tablet by mouth daily.       . citalopram (CELEXA) 20 MG tablet 1.5 tabs daily  135 tablet  3  . magnesium 30 MG tablet Take 30 mg by mouth 2 (two) times daily.       No current facility-administered medications for this visit.    OBJECTIVE: Filed Vitals:   05/24/12 1508  BP: 136/79  Pulse: 62  Temp: 98 F (36.7 C)  Resp: 20     Body mass index is 29.22 kg/(m^2).      ECOG FS:  Grade 1 - Symptomatic, but fully ambulatory            General appearance: Alert, cooperative, well nourished, no apparent distress Head: Normocephalic, without obvious abnormality, atraumatic Eyes: Conjunctivae/corneas clear, PERRLA, EOMI Nose: Nares, septum and mucosa are normal, no drainage or sinus tenderness Neck: No adenopathy, supple, symmetrical, trachea midline, thyroid not enlarged, no tenderness Resp: Clear to auscultation bilaterally Cardio: Regular rate and rhythm, S1, S2 normal, no murmur, click, rub or gallop Breasts: Bilateral firm medial mammary ridge, left breast and axillary area have well-healed surgical scars, left breast tenderness, breast tissue is glandular bilaterally, no lymphadenopathy, no nipple inversion, no axilla fullness GI: Soft, distended, non-tender, hypoactive bowel sounds, no organomegaly Extremities: Extremities normal, atraumatic, no cyanosis or edema Lymph nodes: Cervical, supraclavicular, and axillary nodes normal Neurologic: Grossly normal   LAB RESULTS: Lab Results  Component Value Date   WBC 7.3 11/13/2011   NEUTROABS 4.7 11/13/2011   HGB 13.4 11/13/2011   HCT 39.3  11/13/2011   MCV 92.3 11/13/2011   PLT 254 11/13/2011      Chemistry      Component Value Date/Time   NA 138 11/13/2011 1604   NA 138 05/11/2011 1600   K 4.0 11/13/2011 1604   K 3.8 05/11/2011 1600   CL 104 11/13/2011 1604   CL 105 05/11/2011 1600   CO2 25 11/13/2011 1604   CO2 25 05/11/2011 1600   BUN 14.0 11/13/2011 1604   BUN 23 05/11/2011 1600   CREATININE 0.8 11/13/2011 1604   CREATININE 0.71 05/11/2011 1600      Component Value Date/Time   CALCIUM 9.7 11/13/2011 1604   CALCIUM 9.5 05/11/2011 1600   ALKPHOS 85 11/13/2011 1604   ALKPHOS 68 05/11/2011 1600   AST 28 11/13/2011 1604   AST 22 05/11/2011 1600   ALT 36 11/13/2011 1604   ALT 15 05/11/2011 1600   BILITOT 0.70 11/13/2011 1604   BILITOT 0.7 05/11/2011 1600  Lab Results  Component Value Date   LABCA2 9 11/13/2011    Urinalysis    Component Value Date/Time   COLORURINE YELLOW 10/26/2008 0820   APPEARANCEUR  Value: CLEAR CORRECTED ON 10/01 AT 1036: PREVIOUSLY REPORTED AS CLOUDY 10/26/2008 0820   LABSPEC 1.016 10/26/2008 0820   PHURINE 7.0 10/26/2008 0820   GLUCOSEU NEGATIVE 10/26/2008 0820   HGBUR NEGATIVE 10/26/2008 0820   BILIRUBINUR NEGATIVE 10/26/2008 0820   KETONESUR NEGATIVE 10/26/2008 0820   PROTEINUR NEGATIVE 10/26/2008 0820   UROBILINOGEN 0.2 10/26/2008 0820   NITRITE NEGATIVE 10/26/2008 0820   LEUKOCYTESUR NEGATIVE MICROSCOPIC NOT DONE ON URINES WITH NEGATIVE PROTEIN, BLOOD, LEUKOCYTES, NITRITE, OR GLUCOSE <1000 mg/dL. 10/26/2008 0820    STUDIES: No results found.  ASSESSMENT: 62 y.o. [place]: 1.  Status post left breast needle core biopsy at 11 o'clock position on 10/16/2008 which showed invasive ductal carcinoma, ER 0%, PR 0%, Ki-67 91%, HER-2/neu negative by [facility] with a ratio of 1.35.  2. Status post bilateral breast MRI on 10/24/2008 which showed "in the subareolar left breast at 11 o'clock position is an irregular enhancing mass that measures 2.0 x 1.3 x 1.5 cm.  There is no  mass or suspicious enhancement in the left breast to suggest multifocal or multicentric disease.  There are no masses or suspicious enhancement in the right breast to suggest malignancy.  There is a 1.4 x 0.7 cm lower left axillary lymph node which demonstrates asymmetric cortical thickness and enhancement compared to the patient's other axillary lymph nodes.  No internal mammary chain lymphadenopathy is seen."  3.  Status post left axillary lymph node needle core biopsy on 10/26/2008 which showed fibroadipose tissue without evidence of malignancy.  4.  Status post left breast needle localized lumpectomy with sentinel node biopsy on 10/31/2008 for a stage IIA, pT1c pN1a, 1.9 cm invasive ductal carcinoma, grade 3, years 0%, PR 0%, Ki-67 91%, HER-2/neu negative by CISH, 1/11 positive lymph nodes.  5.  status post adjuvant chemotherapy with TAC from 11/29/2008 through 03/14/2009 x 6 cycles with Neulasta support.  6.  Status post radiation therapy from 04/15/2009 through 05/29/2009.  PLAN: We plan to see Leslie Crawford again in 6 months (10/2012), and then again in 1 year (10/2013) after which time should be eligible to graduate [facility]'s breast cancer program.  The patient's last bilateral digital diagnostic mammogram on 11/16/2011 showed stable post lumpectomy changes in the left breast, the remainder of the exam is unchanged.  The patient will be due for her annual digital diagnostic mammogram in 10/2012 and again in 10/2013 - we will schedule the mammograms for her.  We plan to see Leslie Crawford again in 10/2012 at which time we will check a CBC, CMP, LDH, and vitamin D level.  All questions were answered.  The patient was encouraged to contact us in the interim with any problems, questions or concerns.    Larina Bras, NP-C 05/26/2012, 8:13 PM

## 2012-05-25 ENCOUNTER — Telehealth: Payer: Self-pay | Admitting: Oncology

## 2012-05-25 NOTE — Telephone Encounter (Signed)
S/w pt today re appts for 5/7 @ 4pm and 10/27 @ 8:30am. Pt also given mammo for 10/22.

## 2012-05-26 ENCOUNTER — Encounter: Payer: Self-pay | Admitting: Family

## 2012-06-01 ENCOUNTER — Other Ambulatory Visit (HOSPITAL_BASED_OUTPATIENT_CLINIC_OR_DEPARTMENT_OTHER): Payer: 59 | Admitting: Lab

## 2012-06-01 DIAGNOSIS — Z853 Personal history of malignant neoplasm of breast: Secondary | ICD-10-CM

## 2012-06-01 LAB — COMPREHENSIVE METABOLIC PANEL (CC13)
AST: 25 U/L (ref 5–34)
Alkaline Phosphatase: 83 U/L (ref 40–150)
BUN: 18.3 mg/dL (ref 7.0–26.0)
Creatinine: 1 mg/dL (ref 0.6–1.1)
Potassium: 4.3 mEq/L (ref 3.5–5.1)

## 2012-06-01 LAB — CBC WITH DIFFERENTIAL/PLATELET
BASO%: 0.5 % (ref 0.0–2.0)
EOS%: 3.6 % (ref 0.0–7.0)
HCT: 40.8 % (ref 34.8–46.6)
LYMPH%: 23.9 % (ref 14.0–49.7)
MCH: 31.1 pg (ref 25.1–34.0)
MCHC: 34.6 g/dL (ref 31.5–36.0)
NEUT%: 64 % (ref 38.4–76.8)
lymph#: 2 10*3/uL (ref 0.9–3.3)

## 2012-06-03 ENCOUNTER — Telehealth: Payer: Self-pay | Admitting: Family

## 2012-06-03 NOTE — Telephone Encounter (Signed)
Let Leslie Crawford know that her labs are great, and although  her vitamin D level is normal, I would like her to consider taking vitamin D3 1000 IUs by mouth daily.  The patient voiced understanding.

## 2012-06-15 ENCOUNTER — Ambulatory Visit (INDEPENDENT_AMBULATORY_CARE_PROVIDER_SITE_OTHER): Payer: 59 | Admitting: Family Medicine

## 2012-06-15 VITALS — BP 132/64 | HR 83 | Temp 98.6°F | Resp 16 | Ht 64.0 in | Wt 169.0 lb

## 2012-06-15 DIAGNOSIS — M19039 Primary osteoarthritis, unspecified wrist: Secondary | ICD-10-CM

## 2012-06-15 DIAGNOSIS — J309 Allergic rhinitis, unspecified: Secondary | ICD-10-CM

## 2012-06-15 MED ORDER — PREDNISONE 20 MG PO TABS: ORAL_TABLET | ORAL | Status: DC

## 2012-06-15 NOTE — Progress Notes (Signed)
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    Also complains of post nasal drainage and cough.  Sig Negs: no fever, no other joint pains   Objective:  NAD Wrist bilateral:  Mildly swollen with FROM, minimal tenderness HEENT:  Unremarkable except for clear PND Chest:  Clear  Assessment:  Wrist arthritis, mild.  Allergic rhinitis

## 2012-06-15 NOTE — Patient Instructions (Addendum)
You have a mild arthritis which should resolve with the prednisone over 3-5 days.  The prednisone will also dry up the postnasal symptoms and cough.  You may want to use Afrin type nasal spray tonight and start the prednisone tomorrow since the prednisone when taken late in the day causes insomnia.

## 2012-06-21 ENCOUNTER — Other Ambulatory Visit: Payer: Self-pay | Admitting: Family Medicine

## 2012-06-21 ENCOUNTER — Telehealth: Payer: Self-pay

## 2012-06-21 DIAGNOSIS — M25539 Pain in unspecified wrist: Secondary | ICD-10-CM

## 2012-06-21 MED ORDER — KETOPROFEN 50 MG PO CAPS: 50.0000 mg | ORAL_CAPSULE | Freq: Four times a day (QID) | ORAL | Status: DC | PRN

## 2012-06-21 NOTE — Telephone Encounter (Signed)
PATIENT WANTS TO KNOW IF SHE CAN GO AHEAD AND REFILL HER PREDNISONE. SHE IS CONTINUING TO HAVE PAIN IN BOTH HER WRISTS. [phone]

## 2012-06-21 NOTE — Telephone Encounter (Signed)
Please advise. Do you want to try a NSAID? Will advise can not renew prednisone.

## 2012-07-01 ENCOUNTER — Telehealth: Payer: Self-pay | Admitting: *Deleted

## 2012-07-01 NOTE — Telephone Encounter (Signed)
Pt called stating that she was a previous pt of [facility] and she came and saw Annice Pih and Dr. Darnelle Catalan and since then she has spoken with Dr. Welton Flakes in the hallway and would like to switch to her.  She feels she would be the better fit for her.  I informed her that I would have to send a request to both Drs. Magrinat & Welton Flakes to get the ok and then I would contact her to schedule the appt.  She was fine w/ that.

## 2012-07-06 ENCOUNTER — Other Ambulatory Visit: Payer: Self-pay | Admitting: Oncology

## 2012-07-19 ENCOUNTER — Telehealth: Payer: Self-pay | Admitting: *Deleted

## 2012-07-19 NOTE — Telephone Encounter (Signed)
Called pt and confirmed switch md appt of 11/21/12 for Dr. Welton Flakes w/ pt.

## 2012-08-11 ENCOUNTER — Other Ambulatory Visit: Payer: Self-pay | Admitting: *Deleted

## 2012-08-11 DIAGNOSIS — Z853 Personal history of malignant neoplasm of breast: Secondary | ICD-10-CM

## 2012-08-26 ENCOUNTER — Other Ambulatory Visit: Payer: Self-pay | Admitting: *Deleted

## 2012-08-26 DIAGNOSIS — C50212 Malignant neoplasm of upper-inner quadrant of left female breast: Secondary | ICD-10-CM

## 2012-09-06 LAB — LIPID PANEL
LDL Cholesterol: 196 mg/dL
LDl/HDL Ratio: 3.3

## 2012-09-06 LAB — BASIC METABOLIC PANEL: Glucose: 80 mg/dL

## 2012-10-11 ENCOUNTER — Other Ambulatory Visit: Payer: Self-pay

## 2012-10-11 DIAGNOSIS — F419 Anxiety disorder, unspecified: Secondary | ICD-10-CM

## 2012-10-11 NOTE — Telephone Encounter (Signed)
Pt requests refill on citalopram. Scheduled to see dr L 11/03/12 will run out before then.  Also states she wants to discuss some side effects from medication with him and possibly change it before refilling.   Pharmacy: [facility] in [place] - [address]  bf

## 2012-10-11 NOTE — Telephone Encounter (Signed)
Called her. She has had weight gain and decreased libido. Will run out before her physical is scheduled. She wants to discuss changing this with Dr L at physical, she does not want to d/c until she discusses with him. Pended 30mo supply

## 2012-10-11 NOTE — Telephone Encounter (Signed)
Please get details [id number] effects she's experiencing.

## 2012-10-12 ENCOUNTER — Other Ambulatory Visit: Payer: Self-pay

## 2012-10-12 MED ORDER — CITALOPRAM HYDROBROMIDE 20 MG PO TABS: ORAL_TABLET | ORAL | Status: DC

## 2012-10-12 NOTE — Telephone Encounter (Signed)
Sent.  Follow up with Dr. Elbert Ewings as planned

## 2012-10-26 ENCOUNTER — Ambulatory Visit (INDEPENDENT_AMBULATORY_CARE_PROVIDER_SITE_OTHER): Payer: 59 | Admitting: Family Medicine

## 2012-10-26 VITALS — BP 120/64 | HR 77 | Temp 99.0°F | Resp 16 | Ht 64.0 in | Wt 171.0 lb

## 2012-10-26 DIAGNOSIS — J309 Allergic rhinitis, unspecified: Secondary | ICD-10-CM

## 2012-10-26 DIAGNOSIS — R0982 Postnasal drip: Secondary | ICD-10-CM

## 2012-10-26 DIAGNOSIS — R05 Cough: Secondary | ICD-10-CM

## 2012-10-26 DIAGNOSIS — R059 Cough, unspecified: Secondary | ICD-10-CM

## 2012-10-26 MED ORDER — HYDROCODONE-HOMATROPINE 5-1.5 MG/5ML PO SYRP: 5.0000 mL | ORAL_SOLUTION | Freq: Every evening | ORAL | Status: DC | PRN

## 2012-10-26 MED ORDER — BENZONATATE 100 MG PO CAPS: 200.0000 mg | ORAL_CAPSULE | Freq: Two times a day (BID) | ORAL | Status: DC | PRN

## 2012-10-26 NOTE — Patient Instructions (Signed)
Allergies, Generic  Allergies may happen from anything your body is sensitive to. This may be food, medicines, pollens, chemicals, and nearly anything around you in everyday life that produces allergens. An allergen is anything that causes an allergy producing substance. Heredity is often a factor in causing these problems. This means you may have some of the same allergies as your parents.  Food allergies happen in all age groups. Food allergies are some of the most severe and life threatening. Some common food allergies are cow's milk, seafood, eggs, nuts, wheat, and soybeans.  SYMPTOMS    Swelling around the mouth.   An itchy red rash or hives.   Vomiting or diarrhea.   Difficulty breathing.  SEVERE ALLERGIC REACTIONS ARE LIFE-THREATENING.  This reaction is called anaphylaxis. It can cause the mouth and throat to swell and cause difficulty with breathing and swallowing. In severe reactions only a trace amount of food (for example, peanut oil in a salad) may cause death within seconds.  Seasonal allergies occur in all age groups. These are seasonal because they usually occur during the same season every year. They may be a reaction to molds, grass pollens, or tree pollens. Other causes of problems are house dust mite allergens, pet dander, and mold spores. The symptoms often consist of nasal congestion, a runny itchy nose associated with sneezing, and tearing itchy eyes. There is often an associated itching of the mouth and ears. The problems happen when you come in contact with pollens and other allergens. Allergens are the particles in the air that the body reacts to with an allergic reaction. This causes you to release allergic antibodies. Through a chain of events, these eventually cause you to release histamine into the blood stream. Although it is meant to be protective to the body, it is this release that causes your discomfort. This is why you were given anti-histamines to feel better. If you are  unable to pinpoint the offending allergen, it may be determined by skin or blood testing. Allergies cannot be cured but can be controlled with medicine.  Hay fever is a collection of all or some of the seasonal allergy problems. It may often be treated with simple over-the-counter medicine such as diphenhydramine. Take medicine as directed. Do not drink alcohol or drive while taking this medicine. Check with your caregiver or package insert for child dosages.  If these medicines are not effective, there are many new medicines your caregiver can prescribe. Stronger medicine such as nasal spray, eye drops, and corticosteroids may be used if the first things you try do not work well. Other treatments such as immunotherapy or desensitizing injections can be used if all else fails. Follow up with your caregiver if problems continue. These seasonal allergies are usually not life threatening. They are generally more of a nuisance that can often be handled using medicine.  HOME CARE INSTRUCTIONS    If unsure what causes a reaction, keep a diary of foods eaten and symptoms that follow. Avoid foods that cause reactions.   If hives or rash are present:   Take medicine as directed.   You may use an over-the-counter antihistamine (diphenhydramine) for hives and itching as needed.   Apply cold compresses (cloths) to the skin or take baths in cool water. Avoid hot baths or showers. Heat will make a rash and itching worse.   If you are severely allergic:   Following a treatment for a severe reaction, hospitalization is often required for closer follow-up.     Wear a medic-alert bracelet or necklace stating the allergy.   You and your family must learn how to give adrenaline or use an anaphylaxis kit.   If you have had a severe reaction, always carry your anaphylaxis kit or EpiPen with you. Use this medicine as directed by your caregiver if a severe reaction is occurring. Failure to do so could have a fatal outcome.  SEEK  MEDICAL CARE IF:   You suspect a food allergy. Symptoms generally happen within 30 minutes of eating a food.   Your symptoms have not gone away within 2 days or are getting worse.   You develop new symptoms.   You want to retest yourself or your child with a food or drink you think causes an allergic reaction. Never do this if an anaphylactic reaction to that food or drink has happened before. Only do this under the care of a caregiver.  SEEK IMMEDIATE MEDICAL CARE IF:    You have difficulty breathing, are wheezing, or have a tight feeling in your chest or throat.   You have a swollen mouth, or you have hives, swelling, or itching all over your body.   You have had a severe reaction that has responded to your anaphylaxis kit or an EpiPen. These reactions may return when the medicine has worn off. These reactions should be considered life threatening.  MAKE SURE YOU:    Understand these instructions.   Will watch your condition.   Will get help right away if you are not doing well or get worse.  Document Released: 04/07/2002 Document Revised: 04/06/2011 Document Reviewed: 09/12/2007  ExitCare Patient Information 2014 [employer].

## 2012-10-26 NOTE — Progress Notes (Signed)
[facility]:  Office Visit  Chief Complaint:  Chief Complaint  Patient presents with  . Cough    x 1 week    HPI: Leslie Crawford is a 62 y.o. female who complains of dry cough , feels like chest is tight when she is coughing, mostly at night, dry cough, sinus HA. Has allergies.  No fevers, chills, ear pain, sore throat previously when she felt clear drainage. No SOB, wheezes, CP, palpitations. Not around children, no longer has sore throat.   Past Medical History  Diagnosis Date  . Anxiety   . Depression   . Breast cancer 2010    S/P Chemo/RadTx- Dr. Pierce Crawford  . Osteopenia   . Allergy    Past Surgical History  Procedure Laterality Date  . Tonsillectomy and adenoidectomy    . Breast lumpectomy  10/29/2008    Negative markers- 1 positive node  . Abdominal hysterectomy      Total hysterectomy with BSO   History   Social History  . Marital Status: Married    Spouse Name: N/A    Number of Children: N/A  . Years of Education: N/A   Social History Main Topics  . Smoking status: Former Games developer  . Smokeless tobacco: Never Used  . Alcohol Use: 3.0 oz/week    5 Cans of beer per week  . Drug Use: No  . Sexual Activity: Yes    Birth Control/ Protection: Surgical   Other Topics Concern  . None   Social History Narrative  . None   Family History  Problem Relation Age of Onset  . Cancer Mother     Throat cancer  . Cancer Father     Stomach cancer  . Early death Maternal Grandmother   . Pneumonia Maternal Grandmother   . Heart attack Maternal Grandfather   . Heart attack Paternal Grandfather    No Known Allergies Prior to Admission medications   Medication Sig Start Date End Date Taking? Authorizing Provider  aspirin 81 MG tablet Take 81 mg by mouth daily.   Yes Historical Provider, MD  Biotin 10 MG TABS Take 1 tablet by mouth daily.    Yes Historical Provider, MD  citalopram (CELEXA) 20 MG tablet 1.5 tabs daily 10/11/12  Yes Leslie E  Egan, PA-C  magnesium 30 MG tablet Take 30 mg by mouth 2 (two) times daily.   Yes Historical Provider, MD  ketoprofen (ORUDIS) 50 MG capsule Take 1 capsule (50 mg total) by mouth 4 (four) times daily as needed for pain. 06/21/12   Leslie Sidle, MD  predniSONE (DELTASONE) 20 MG tablet 2 daily with food 06/15/12   Leslie Sidle, MD     ROS: The patient denies fevers, chills, night sweats, unintentional weight loss, chest pain, palpitations, wheezing, dyspnea on exertion, nausea, vomiting, abdominal pain, dysuria, hematuria, melena, numbness, weakness, or tingling.   All other systems have been reviewed and were otherwise negative with the exception of those mentioned in the HPI and as above.    PHYSICAL EXAM: Filed Vitals:   10/26/12 0800  BP: 120/64  Pulse: 77  Temp: 99 F (37.2 C)  Resp: 16   Filed Vitals:   10/26/12 0800  Height: 5\' 4"  (1.626 m)  Weight: 171 lb (77.565 kg)   Body mass index is 29.34 kg/(m^2).  General: Alert, no acute distress HEENT:  Normocephalic, atraumatic, oropharynx patent. EOMI, PERRLA Cardiovascular:  Regular rate and rhythm, no rubs murmurs or gallops.  No Carotid bruits,  radial pulse intact. No pedal edema.  Respiratory: Clear to auscultation bilaterally.  No wheezes, rales, or rhonchi.  No cyanosis, no use of accessory musculature GI: No organomegaly, abdomen is soft and non-tender, positive bowel sounds.  No masses. Skin: No rashes. Neurologic: Facial musculature symmetric. Psychiatric: Patient is appropriate throughout our interaction. Lymphatic: No cervical lymphadenopathy Musculoskeletal: Gait intact.   LABS: Results for orders placed in visit on 06/01/12  CBC WITH DIFFERENTIAL      Result Value Range   WBC 8.5  3.9 - 10.3 10e3/uL   NEUT# 5.4  1.5 - 6.5 10e3/uL   HGB 14.1  11.6 - 15.9 g/dL   HCT 16.1  09.6 - 04.5 %   Platelets 277  145 - 400 10e3/uL   MCV 89.9  79.5 - 101.0 fL   MCH 31.1  25.1 - 34.0 pg   MCHC 34.6  31.5 - 36.0  g/dL   RBC 4.09  8.11 - 9.14 10e6/uL   RDW 13.0  11.2 - 14.5 %   lymph# 2.0  0.9 - 3.3 10e3/uL   MONO# 0.7  0.1 - 0.9 10e3/uL   Eosinophils Absolute 0.3  0.0 - 0.5 10e3/uL   Basophils Absolute 0.0  0.0 - 0.1 10e3/uL   NEUT% 64.0  38.4 - 76.8 %   LYMPH% 23.9  14.0 - 49.7 %   MONO% 8.0  0.0 - 14.0 %   EOS% 3.6  0.0 - 7.0 %   BASO% 0.5  0.0 - 2.0 %  VITAMIN D 25 HYDROXY      Result Value Range   Vit D, 25-Hydroxy 36  30 - 89 ng/mL  COMPREHENSIVE METABOLIC PANEL (CC13)      Result Value Range   Sodium 139  136 - 145 mEq/L   Potassium 4.3  3.5 - 5.1 mEq/L   Chloride 103  98 - 107 mEq/L   CO2 26  22 - 29 mEq/L   Glucose 86  70 - 99 mg/dl   BUN 78.2  7.0 - 95.6 mg/dL   Creatinine 1.0  0.6 - 1.1 mg/dL   Total Bilirubin 2.13  0.20 - 1.20 mg/dL   Alkaline Phosphatase 83  40 - 150 U/L   AST 25  5 - 34 U/L   ALT 20  0 - 55 U/L   Total Protein 7.3  6.4 - 8.3 g/dL   Albumin 3.9  3.5 - 5.0 g/dL   Calcium 9.5  8.4 - 08.6 mg/dL  LACTATE DEHYDROGENASE (CC13)      Result Value Range   LDH 193  125 - 245 U/L     EKG/XRAY:   Primary read interpreted by Dr. Conley Crawford at [facility].   ASSESSMENT/PLAN: Encounter Diagnoses  Name Primary?  . Allergic rhinitis Yes  . Cough   . Post-nasal drip    Try otc nasalcort Rx Tessalon Perles, Hydromet No abx needed, she most likely has allergic related PND or viral URI  F/u prn  Gross sideeffects, risk and benefits, and alternatives of medications d/w patient. Patient is aware that all medications have potential sideeffects and we are unable to predict every sideeffect or drug-drug interaction that may occur.  Leslie Burkman PHUONG, DO 10/26/2012 8:28 AM

## 2012-10-28 ENCOUNTER — Other Ambulatory Visit: Payer: Self-pay | Admitting: *Deleted

## 2012-10-28 DIAGNOSIS — C50212 Malignant neoplasm of upper-inner quadrant of left female breast: Secondary | ICD-10-CM

## 2012-11-03 ENCOUNTER — Ambulatory Visit (INDEPENDENT_AMBULATORY_CARE_PROVIDER_SITE_OTHER): Payer: 59 | Admitting: Family Medicine

## 2012-11-03 ENCOUNTER — Encounter: Payer: Self-pay | Admitting: Family Medicine

## 2012-11-03 VITALS — BP 151/90 | HR 86 | Temp 98.9°F | Resp 16 | Ht 63.75 in | Wt 171.6 lb

## 2012-11-03 DIAGNOSIS — Z0000 Encounter for general adult medical examination without abnormal findings: Secondary | ICD-10-CM

## 2012-11-03 DIAGNOSIS — G4700 Insomnia, unspecified: Secondary | ICD-10-CM

## 2012-11-03 DIAGNOSIS — F419 Anxiety disorder, unspecified: Secondary | ICD-10-CM

## 2012-11-03 DIAGNOSIS — F411 Generalized anxiety disorder: Secondary | ICD-10-CM

## 2012-11-03 LAB — CBC WITH DIFFERENTIAL/PLATELET
Basophils Absolute: 0 10*3/uL (ref 0.0–0.1)
Basophils Relative: 1 % (ref 0–1)
Eosinophils Absolute: 0.3 10*3/uL (ref 0.0–0.7)
Eosinophils Relative: 4 % (ref 0–5)
HCT: 42.8 % (ref 36.0–46.0)
Hemoglobin: 15.1 g/dL — ABNORMAL HIGH (ref 12.0–15.0)
Lymphocytes Relative: 26 % (ref 12–46)
Lymphs Abs: 2 10*3/uL (ref 0.7–4.0)
MCH: 31.7 pg (ref 26.0–34.0)
MCHC: 35.3 g/dL (ref 30.0–36.0)
MCV: 89.9 fL (ref 78.0–100.0)
Monocytes Absolute: 0.7 10*3/uL (ref 0.1–1.0)
Monocytes Relative: 9 % (ref 3–12)
Neutro Abs: 4.6 10*3/uL (ref 1.7–7.7)
Neutrophils Relative %: 60 % (ref 43–77)
Platelets: 322 10*3/uL (ref 150–400)
RBC: 4.76 MIL/uL (ref 3.87–5.11)
RDW: 13.3 % (ref 11.5–15.5)
WBC: 7.6 10*3/uL (ref 4.0–10.5)

## 2012-11-03 LAB — COMPREHENSIVE METABOLIC PANEL
ALT: 24 U/L (ref 0–35)
AST: 27 U/L (ref 0–37)
Albumin: 4.7 g/dL (ref 3.5–5.2)
Alkaline Phosphatase: 83 U/L (ref 39–117)
BUN: 11 mg/dL (ref 6–23)
CO2: 26 mEq/L (ref 19–32)
Calcium: 9.9 mg/dL (ref 8.4–10.5)
Chloride: 102 mEq/L (ref 96–112)
Creat: 0.79 mg/dL (ref 0.50–1.10)
Glucose, Bld: 83 mg/dL (ref 70–99)
Potassium: 4.5 mEq/L (ref 3.5–5.3)
Sodium: 138 mEq/L (ref 135–145)
Total Bilirubin: 1.1 mg/dL (ref 0.3–1.2)
Total Protein: 7.2 g/dL (ref 6.0–8.3)

## 2012-11-03 LAB — POCT UA - MICROSCOPIC ONLY
Bacteria, U Microscopic: NEGATIVE
Casts, Ur, LPF, POC: NEGATIVE
Crystals, Ur, HPF, POC: NEGATIVE
Yeast, UA: NEGATIVE

## 2012-11-03 LAB — POCT URINALYSIS DIPSTICK
Bilirubin, UA: NEGATIVE
Glucose, UA: NEGATIVE
Ketones, UA: NEGATIVE
Nitrite, UA: NEGATIVE
Protein, UA: NEGATIVE
Spec Grav, UA: 1.02
Urobilinogen, UA: 0.2
pH, UA: 7

## 2012-11-03 LAB — TSH: TSH: 2.762 u[IU]/mL (ref 0.350–4.500)

## 2012-11-03 MED ORDER — CITALOPRAM HYDROBROMIDE 20 MG PO TABS: 20.0000 mg | ORAL_TABLET | Freq: Every day | ORAL | Status: DC

## 2012-11-03 NOTE — Progress Notes (Signed)
  Subjective:    Patient ID: Leslie Crawford, female    DOB: December 09, 1950, 62 y.o.   MRN: [id number]  HPI Mammogram q 6 months.  Seeing Dr. Park Breed Bone density every 2 years Sees Dr. Adalberto Ill for gyn, next week Colonoscopy:  q 10 years. (last about 5 years) Flu shot due  Married, husband retired Works for [employer]    Review of Systems  Constitutional: Positive for diaphoresis and unexpected weight change.  HENT: Positive for postnasal drip.   Eyes: Negative.   Respiratory: Positive for cough.   Cardiovascular: Negative.   Gastrointestinal: Negative.   Endocrine: Positive for heat intolerance.  Genitourinary: Negative.   Musculoskeletal: Negative.   Skin: Negative.   Allergic/Immunologic: Negative.   Neurological: Negative.   Hematological: Negative.   Psychiatric/Behavioral: Negative.        Objective:   Physical Exam NAD Eyes:  Normal fundi, EOMI Ears:  Normal hearing, normal TM's Oroph:  Good dentitioin, no lesions Neck:  Supple, no adenopathy or thyromegaly Chest:  Clear Heart:  Regular, no murmur Abdomen:  Soft, no HSM, no masses Ext:  No edema, good pulses Skin:  Clear     Assessment & Plan:  Routine general medical examination at a health care facility - Plan: POCT UA - Microscopic Only, POCT urinalysis dipstick, CBC with Differential, Comprehensive metabolic panel, POCT urinalysis dipstick, TSH  Anxiety - Plan: citalopram (CELEXA) 20 MG tablet  Insomnia - Plan: citalopram (CELEXA) 20 MG tablet  Signed, Elvina Sidle, MD

## 2012-11-10 ENCOUNTER — Other Ambulatory Visit: Payer: Self-pay | Admitting: Obstetrics and Gynecology

## 2012-11-16 ENCOUNTER — Ambulatory Visit
Admission: RE | Admit: 2012-11-16 | Discharge: 2012-11-16 | Disposition: A | Discharge status: Home / Self Care | Payer: Private Health Insurance - Indemnity | Source: Ambulatory Visit | Attending: Family | Admitting: Family

## 2012-11-16 DIAGNOSIS — Z853 Personal history of malignant neoplasm of breast: Secondary | ICD-10-CM

## 2012-11-17 ENCOUNTER — Other Ambulatory Visit: Payer: 59 | Admitting: Lab

## 2012-11-17 ENCOUNTER — Ambulatory Visit: Payer: 59 | Admitting: Family

## 2012-11-21 ENCOUNTER — Ambulatory Visit (HOSPITAL_BASED_OUTPATIENT_CLINIC_OR_DEPARTMENT_OTHER): Payer: Private Health Insurance - Indemnity | Admitting: Lab

## 2012-11-21 ENCOUNTER — Other Ambulatory Visit: Payer: 59 | Admitting: Lab

## 2012-11-21 ENCOUNTER — Telehealth: Payer: Self-pay | Admitting: *Deleted

## 2012-11-21 ENCOUNTER — Ambulatory Visit (HOSPITAL_BASED_OUTPATIENT_CLINIC_OR_DEPARTMENT_OTHER): Payer: Private Health Insurance - Indemnity | Admitting: Oncology

## 2012-11-21 ENCOUNTER — Encounter: Payer: Self-pay | Admitting: Oncology

## 2012-11-21 ENCOUNTER — Ambulatory Visit: Payer: 59 | Admitting: Family

## 2012-11-21 VITALS — BP 126/65 | HR 71 | Temp 98.2°F | Resp 20 | Ht 63.75 in | Wt 173.4 lb

## 2012-11-21 DIAGNOSIS — C50919 Malignant neoplasm of unspecified site of unspecified female breast: Secondary | ICD-10-CM

## 2012-11-21 DIAGNOSIS — Z853 Personal history of malignant neoplasm of breast: Secondary | ICD-10-CM

## 2012-11-21 DIAGNOSIS — C773 Secondary and unspecified malignant neoplasm of axilla and upper limb lymph nodes: Secondary | ICD-10-CM

## 2012-11-21 DIAGNOSIS — Z170 Estrogen receptor positive status [ER+]: Secondary | ICD-10-CM

## 2012-11-21 DIAGNOSIS — F411 Generalized anxiety disorder: Secondary | ICD-10-CM

## 2012-11-21 DIAGNOSIS — C50219 Malignant neoplasm of upper-inner quadrant of unspecified female breast: Secondary | ICD-10-CM

## 2012-11-21 DIAGNOSIS — C50212 Malignant neoplasm of upper-inner quadrant of left female breast: Secondary | ICD-10-CM

## 2012-11-21 LAB — COMPREHENSIVE METABOLIC PANEL (CC13)
Albumin: 4 g/dL (ref 3.5–5.0)
Anion Gap: 10 mEq/L (ref 3–11)
BUN: 18.3 mg/dL (ref 7.0–26.0)
CO2: 25 mEq/L (ref 22–29)
Calcium: 9.9 mg/dL (ref 8.4–10.4)
Chloride: 105 mEq/L (ref 98–109)
Glucose: 86 mg/dl (ref 70–140)
Potassium: 4.4 mEq/L (ref 3.5–5.1)
Sodium: 139 mEq/L (ref 136–145)
Total Bilirubin: 0.65 mg/dL (ref 0.20–1.20)
Total Protein: 7.3 g/dL (ref 6.4–8.3)

## 2012-11-21 LAB — CBC WITH DIFFERENTIAL/PLATELET
BASO%: 0.3 % (ref 0.0–2.0)
Basophils Absolute: 0 10*3/uL (ref 0.0–0.1)
Eosinophils Absolute: 0.4 10*3/uL (ref 0.0–0.5)
HCT: 40 % (ref 34.8–46.6)
HGB: 13.7 g/dL (ref 11.6–15.9)
MCV: 89.9 fL (ref 79.5–101.0)
MONO#: 0.6 10*3/uL (ref 0.1–0.9)
NEUT#: 4.6 10*3/uL (ref 1.5–6.5)
RBC: 4.45 10*6/uL (ref 3.70–5.45)
RDW: 12.9 % (ref 11.2–14.5)
WBC: 7.6 10*3/uL (ref 3.9–10.3)

## 2012-11-21 NOTE — Patient Instructions (Addendum)
Doing well no clinical evidence of recurrence  We will see you back in 6 months for follow up  Breast Cancer Survivor Follow-Up Breast cancer begins when cells in the breast divide too rapidly. The extra cells form a lump (tumor). When the cancer is treated, the goal is to get rid of all cancer cells. However, sometimes a few cells survive. These cancer cells can then grow. They become recurrent cancer. This means the cancer comes back after treatment.  Most cases of recurrent breast cancer develop 3 to 5 years after treatment. However, sometimes it comes back just a few months after treatment. Other times, it does not come back until years later. If the cancer comes back in the same area as the first breast cancer, it is called a local recurrence. If the cancer comes back somewhere else in the body, it is called regional recurrence if the site is fairly near the breast or distant recurrence if it is far from the breast. Your caregiver may also use the term metastasize to indicate a cancer that has gone to another part of your body. Treatment is still possible after either kind of recurrence. The cancer can still be controlled.  CAUSES OF RECURRENT CANCER No one knows exactly why breast cancer starts in the first place. Why the cancer comes back after treatment is also not clear. It is known that certain conditions, called risk factors, can make this more likely. They include:  Developing breast cancer for the first time before age 78.  Having breast cancer that involves the lymph nodes. These are small, round pieces of tissue found all over the body. Their job is to help fight infections.  Having a large tumor. Cancer is more apt to come back if the first tumor was bigger than 2 inches (5 cm).  Having certain types of breast cancer, such as:  Inflammatory breast cancer. This rare type grows rapidly and causes the breast to become red and swollen.  A high-grade tumor. The grade of a tumor  indicates how fast it will grow and spread. High-grade tumors grow more quickly than other types.  HER2 cancer. This refers to the tumor's genetic makeup. Tumors that have this type of gene are more likely to come back after treatment.  Having close tumor margins. This refers to the space between the tumor and normal, noncancerous cells. If the space is small, the tumor has a greater chance of coming back.  Having treatment involving a surgery to remove the tumor but not the entire breast (lumpectomy) and no radiation therapy. CARE AFTER BREAST CANCER Home Monitoring Women who have had breast cancer should continue to examine their breasts every month. The goal is to catch the cancer quickly if it comes back. Many women find it helpful to do so on the same day each month and to mark the calendar as a reminder. Let your caregiver know immediately if you have any signs of recurrent breast cancer. Symptoms will vary, depending on where the cancer recurs. The original type of treatment can also make a difference. Symptoms of local recurrence after a lumpectomy or a recurrence in the opposite breast may include:  A new lump or thickening in the breast.  A change in the way the skin looks on the breast (such as a rash, dimpling, or wrinkling).  Redness or swelling of the breast.  Changes in the nipple (such as being red, puckered, swollen, or leaking fluid). Symptoms of a recurrence after a breast removal  surgery (mastectomy) may include:  A lump or thickening under the skin.  A thickening around the mastectomy scar. Symptoms of regional recurrence in the lymph nodes near the breast may include:  A lump under the arm or above the collarbone.  Swelling of the arm.  Pain in the arm, shoulder, or chest.  Numbness in the hand or arm. Symptoms of distant recurrence may include:  A cough that does not go away.  Trouble breathing or shortness of breath.  Pain in the bones or the chest. This  is pain that lasts or does not respond to rest and medicine.  Headaches.  Sudden vision problems.  Dizziness.  Nausea or vomiting.  Losing weight without trying to.  Persistent abdominal pain.  Changes in bowel movements or blood in the stool.  Yellowing of the skin or eyes (jaundice).  Blood in the urine or bloody vaginal discharge. Clinical Monitoring  It is helpful to keep a schedule of appointments for needed tests and exams. This includes physical exams, breast exams, exams of the lymph nodes, and general exams.  For the first 3 years after being treated for breast cancer, see your caregiver every 3 to 6 months.  For years 4 and 5 after breast cancer, see your caregiver every 6 to 12 months.  After 5 years, see your caregiver at least once a year.  Regular breast X-rays (mammograms) should continue even if you had a mastectomy.  A mammogram should be done 1 year after the mammogram that first detected breast cancer.  A mammogram should be done every 6 to 12 months after that. Follow your caregiver's advice.  A pelvic exam done by your caregiver checks whether female organs are the normal size and shape. The exam is usually done every year. Ask your caregiver if that schedule is right for you.  Women taking tamoxifen should report any vaginal bleeding immediately to their caregiver. Tamoxifen is often given to women with a certain type of breast cancer. It has been shown to help prevent recurrence.  You will need to decide who your primary caregiver will be.  Most people continue to see their cancer specialist (oncologist) every 3 to 6 months for the first year after cancer treatment.  At some point, you may want to go back to seeing your family caregiver. You would no longer see your oncologist for regular checkups. Many women do this about 1 year after their first diagnosis of breast cancer.  You will still need to be seen every so often by your oncologist. Ask how  often that should be. Coordinate this with your family or primary caregiver.  Think about having genetic counseling. This would provide information on traits that can be passed or inherited from one generation to the next. In some cases, breast cancer runs in families. Tell your caregiver if you:  Are of Ashkenazi Jewish heritage.  Have any family member who has had ovarian cancer.  Have a mother, sister, or daughter who had breast cancer before age 24.  Have 2 or more close relatives who have had breast cancer. This means a mother, sister, daughter, aunt, or grandmother.  Had breast cancer in both breasts.  Have a female relative who has had breast cancer.  Some tests are not recommended for routine screening. Someone recovering from breast cancer does not need to have these tests if there are no problems. The tests have risks, such as radiation exposure, and can be costly. The risks of these tests are  thought to be greater than the benefits:  Blood tests.  Chest X-rays.  Bone scans.  Liver ultrasound.  Computed tomography (CT scan).  Positron emission tomography (PET scan).  Magnetic resonance imaging (MRI scan). DIAGNOSIS OF RECURRENT CANCER Recurrent breast cancer may be suspected for various reasons. A mammogram may not look normal. You might feel a lump or have other symptoms. Your caregiver may find something unusual during an exam. To be sure, your caregiver will probably order some tests. The tests are needed because there are symptoms or hints of a problem. They could include:  Blood tests, including a test to check how well the liver is working. The liver is a common site for a distant cancer recurrence.  Imaging tests that create pictures of the inside of the body. These tests include:  Chest X-rays to show if the cancer has come back in the lungs.  CT scans to create detailed pictures of various areas of the body and help find a distant recurrence.  MRI scans to  find anything unusual in the breast, chest, or lymph nodes.  Breast ultrasound tests to examine the breasts.  Bone scans to create a picture of your whole skeleton and find cancer in bony areas.  PET scans to create an image of the whole body. PET scans can be used together with CT scans to show more detail.  Biopsy. A small sample of tissue is taken and checked under a microscope. If cancer cells are found, they may be tested to see if they contain the HER2 gene or the hormones estrogen and progesterone. This will help your caregiver decide how to treat the recurrent cancer. TREATMENT  How recurrent breast cancer is treated depends on where the new cancer is found. The type of treatment that was used for the first breast cancer makes a difference, too. A combination of treatments may be used. Options include:  Surgery.  If the cancer comes back in the breast that was not treated before, you may need a lumpectomy or mastectomy.  If the cancer comes back in the breast that was treated before, you may need a mastectomy.  The lymph nodes under the arm may need to be removed.  Radiation therapy.  For a local recurrence, radiation may be used if it was not used during the first treatment.  For a distance recurrence, radiation is sometimes used.  Chemotherapy.  This may be used before surgery to treat recurrent breast cancer.  This may be used to treat recurrent cancer that cannot be treated with surgery.  This may be used to treat a distant recurrence.  Hormone therapy.  Women with the HER2 gene may be given hormone therapy to attack this gene. Document Released: 09/10/2010 Document Revised: 04/06/2011 Document Reviewed: 09/10/2010 [facility] Patient Information 2014 [place], [place].

## 2012-11-21 NOTE — Progress Notes (Signed)
[facility]  [phone] [phone]) [phone]  OFFICE PROGRESS NOTE   ID: Leslie Crawford   DOB: 1950-04-28  MR#: [id number]  JYN#:[id number]   PCP: Leslie Ora, MD GYN: Leslie Crawford, M.D. SU: Currie Paris, M.D. RAD ONC: Leslie Crawford, M.D. Other MD: Leslie Sidle, MD   HISTORY OF PRESENT ILLNESS:  "This is a delightful 62 year old woman from [place] here today with her husband, Leslie Crawford, for discussion of evaluation and treatment of breast cancer.  [name] has been in good health.  She does have annual screening mammograms.  She underwent a screening mammogram at [facility].  Possible mass left breast was identified.  Diagnostic view with ultrasound performed 10/16/2008 showed a suspicious mass in the left breast.  This was apparently palpable as well located at 11 o'clock position 3 to 4 cm from the nipple.  This measured 1.3 x 1.2 x 1.2 cm.  A biopsy was obtained the same day and this revealed an ER/PR negative, HER-2 negative breast cancer.  Proliferative index was 91%.  This is an invasive ductal cancer.  The patient underwent a preoperative MRI scan on 10/24/2008.  This showed a subareolar breast mass 11 o'clock position measuring 2 x 1.3 x 1.5 cm.  There is a left lower axillary lymph node, which appeared to be prominent.  Ultimately, the patient underwent a lumpectomy and sentinel lymph node evaluation on 10/31/2008.  This did show a 1.9 cm grade 3/3 invasive ductal cancer.  Surgical margins were clear.  Lymphovascular invasion was present.  In one lymph node, there was extracapsular extension.  Margins were free of disease tumor with the closest margin being 0.5 cm.  The patient has had an unremarkable postoperative course."  Her subsequent history is as detailed below.   INTERVAL HISTORY: doing well, some aches and pains. She denies any fevers or chills, no masses in the breasts, toes with some neuropathy but significantly  improved.    REVIEW OF SYSTEMS: A 10 point review of systems was completed and is negative except  hot flashes/night sweats/vaginal dryness all of which the patient states is tolerable.  The patient denies any other symptomatology.   PAST MEDICAL HISTORY: Past Medical History  Diagnosis Date  . Anxiety   . Depression   . Breast cancer 2010    S/P Chemo/RadTx- Dr. Pierce Crane  . Osteopenia   . Allergy     PAST SURGICAL HISTORY: Past Surgical History  Procedure Laterality Date  . Tonsillectomy and adenoidectomy    . Breast lumpectomy  10/29/2008    Negative markers- 1 positive node  . Abdominal hysterectomy      Total hysterectomy with BSO  Includes hysterectomy with bilateral oophorectomy in 2003 for fibroids.  She has a history of tonsillectomy.    FAMILY HISTORY Family History  Problem Relation Age of Onset  . Cancer Mother     Throat cancer  . Cancer Father     Stomach cancer  . Early death Maternal Grandmother   . Pneumonia Maternal Grandmother   . Heart attack Maternal Grandfather   . Heart attack Paternal Grandfather   Parents deceased; father from stomach cancer, mother from throat cancer.  She has a paternal grandmother who had breast cancer who had a mastectomy.   GYNECOLOGIC HISTORY: She is G2, P44, menarche age 25, parity age 6.  She was on hormone replacement therapy after hysterectomy for 5 years.   SOCIAL HISTORY: Mrs. Leslie Crawford has been married to her  husband Leslie Crawford for over 40 years .  They have one adult son who lives in [place], [place] . Her husband is retired.  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  In her spare time she enjoys traveling, spending time with friends, gardening, and cooking.      ADVANCED DIRECTIVES: Not on file  HEALTH MAINTENANCE: History  Substance Use Topics  . Smoking status: Former Games developer  . Smokeless tobacco: Never  Used  . Alcohol Use: 3.0 oz/week    5 Cans of beer per week    Colonoscopy: 08/24/2007 PAP: 11/10/2011 Bone density: Not on file Lipid panel: Not on file  No Known Allergies  Current Outpatient Prescriptions  Medication Sig Dispense Refill  . aspirin 81 MG tablet Take 81 mg by mouth daily.      . Biotin 10 MG TABS Take 1 tablet by mouth daily.       . citalopram (CELEXA) 20 MG tablet Take 1 tablet (20 mg total) by mouth daily.  90 tablet  3  . magnesium 30 MG tablet Take 30 mg by mouth 2 (two) times daily.       No current facility-administered medications for this visit.    OBJECTIVE: Filed Vitals:   11/21/12 1158  BP: 126/65  Pulse: 71  Temp: 98.2 F (36.8 C)  Resp: 20     Body mass index is 30.01 kg/(m^2).      ECOG FS:  Grade 1 - Symptomatic, but fully ambulatory            General appearance: Alert, cooperative, well nourished, no apparent distress Head: Normocephalic, without obvious abnormality, atraumatic Eyes: Conjunctivae/corneas clear, PERRLA, EOMI Nose: Nares, septum and mucosa are normal, no drainage or sinus tenderness Neck: No adenopathy, supple, symmetrical, trachea midline, thyroid not enlarged, no tenderness Resp: Clear to auscultation bilaterally Cardio: Regular rate and rhythm, S1, S2 normal, no murmur, click, rub or gallop Breasts: Bilateral firm medial mammary ridge, left breast and axillary area have well-healed surgical scars, left breast tenderness, breast tissue is glandular bilaterally, no lymphadenopathy, no nipple inversion, no axilla fullness GI: Soft, distended, non-tender, hypoactive bowel sounds, no organomegaly Extremities: Extremities normal, atraumatic, no cyanosis or edema Lymph nodes: Cervical, supraclavicular, and axillary nodes normal Neurologic: Grossly normal   LAB RESULTS: Lab Results  Component Value Date   WBC 7.6 11/03/2012   NEUTROABS 4.6 11/03/2012   HGB 15.1* 11/03/2012   HCT 42.8 11/03/2012   MCV 89.9 11/03/2012   PLT  322 11/03/2012      Chemistry      Component Value Date/Time   NA 138 11/03/2012 0924   NA 139 06/01/2012 1554   K 4.5 11/03/2012 0924   K 4.3 06/01/2012 1554   CL 102 11/03/2012 0924   CL 103 06/01/2012 1554   CO2 26 11/03/2012 0924   CO2 26 06/01/2012 1554   BUN 11 11/03/2012 0924   BUN 18.3 06/01/2012 1554   CREATININE 0.79 11/03/2012 0924   CREATININE 1.0 06/01/2012 1554   CREATININE 0.71 05/11/2011 1600   GLU 80 09/06/2012      Component Value Date/Time   CALCIUM 9.9 11/03/2012 0924   CALCIUM 9.5 06/01/2012 1554   ALKPHOS 83 11/03/2012 0924   ALKPHOS 83 06/01/2012 1554   AST 27 11/03/2012 0924   AST 25 06/01/2012 1554   ALT 24 11/03/2012 0924   ALT 20 06/01/2012 1554   BILITOT 1.1  11/03/2012 0924   BILITOT 0.77 06/01/2012 1554       Lab Results  Component Value Date   LABCA2 9 11/13/2011    Urinalysis    Component Value Date/Time   COLORURINE YELLOW 10/26/2008 0820   APPEARANCEUR  Value: CLEAR CORRECTED ON 10/01 AT 1036: PREVIOUSLY REPORTED AS CLOUDY 10/26/2008 0820   LABSPEC 1.016 10/26/2008 0820   PHURINE 7.0 10/26/2008 0820   GLUCOSEU NEGATIVE 10/26/2008 0820   HGBUR NEGATIVE 10/26/2008 0820   BILIRUBINUR neg 11/03/2012 0807   BILIRUBINUR NEGATIVE 10/26/2008 0820   KETONESUR NEGATIVE 10/26/2008 0820   PROTEINUR NEGATIVE 10/26/2008 0820   UROBILINOGEN 0.2 11/03/2012 0807   UROBILINOGEN 0.2 10/26/2008 0820   NITRITE neg 11/03/2012 0807   NITRITE NEGATIVE 10/26/2008 0820   LEUKOCYTESUR small (1+) 11/03/2012 0807    STUDIES: No results found.  ASSESSMENT: 62 y.o. [place], [place]: 1.  Status post left breast needle core biopsy at 11 o'clock position on 10/16/2008 which showed invasive ductal carcinoma, ER 0%, PR 0%, Ki-67 91%, HER-2/neu negative by CISH with a ratio of 1.35.  2. Status post bilateral breast MRI on 10/24/2008 which showed "in the subareolar left breast at 11 o'clock position is an irregular enhancing mass that measures 2.0 x 1.3 x 1.5 cm.  There is no mass or  suspicious enhancement in the left breast to suggest multifocal or multicentric disease.  There are no masses or suspicious enhancement in the right breast to suggest malignancy.  There is a 1.4 x 0.7 cm lower left axillary lymph node which demonstrates asymmetric cortical thickness and enhancement compared to the patient's other axillary lymph nodes.  No internal mammary chain lymphadenopathy is seen."  3.  Status post left axillary lymph node needle core biopsy on 10/26/2008 which showed fibroadipose tissue without evidence of malignancy.  4.  Status post left breast needle localized lumpectomy with sentinel node biopsy on 10/31/2008 for a stage IIA, pT1c pN1a, 1.9 cm invasive ductal carcinoma, grade 3, years 0%, PR 0%, Ki-67 91%, HER-2/neu negative by CISH, 1/11 positive lymph nodes.  5.  status post adjuvant chemotherapy with TAC from 11/29/2008 through 03/14/2009 x 6 cycles with Neulasta support.  6.  Status post radiation therapy from 04/15/2009 through 05/29/2009.  7. No clinical evidence of recurrence  PLAN:  1. Continue to follow every 6 months for follow up  2. We discussed genetics of breast cancer   All questions were answered.  The patient was encouraged to contact us in the interim with any problems, questions or concerns. The length of time of the face-to-face encounter was 30    minutes. More than 50% of time was spent counseling and coordination of care.    Drue Second, MD Medical/Oncology [facility] [phone] (beeper) [phone] (Office)  11/21/2012, 1:02 PM

## 2012-11-21 NOTE — Telephone Encounter (Signed)
appts made and printed...td 

## 2012-12-01 ENCOUNTER — Other Ambulatory Visit: Payer: Self-pay

## 2013-01-27 ENCOUNTER — Ambulatory Visit (INDEPENDENT_AMBULATORY_CARE_PROVIDER_SITE_OTHER): Payer: Managed Care, Other (non HMO) | Admitting: Physician Assistant

## 2013-01-27 VITALS — BP 128/60 | HR 61 | Temp 98.4°F | Resp 16 | Ht 64.0 in | Wt 178.0 lb

## 2013-01-27 DIAGNOSIS — J329 Chronic sinusitis, unspecified: Secondary | ICD-10-CM

## 2013-01-27 DIAGNOSIS — R05 Cough: Secondary | ICD-10-CM

## 2013-01-27 DIAGNOSIS — R059 Cough, unspecified: Secondary | ICD-10-CM

## 2013-01-27 MED ORDER — BENZONATATE 100 MG PO CAPS: ORAL_CAPSULE | ORAL | Status: AC

## 2013-01-27 MED ORDER — GUAIFENESIN ER 1200 MG PO TB12: 1.0000 | ORAL_TABLET | Freq: Two times a day (BID) | ORAL | Status: AC

## 2013-01-27 MED ORDER — AMOXICILLIN 875 MG PO TABS: 1750.0000 mg | ORAL_TABLET | Freq: Two times a day (BID) | ORAL | Status: DC

## 2013-01-27 MED ORDER — TRIAMCINOLONE ACETONIDE 55 MCG/ACT NA AERO: 2.0000 | INHALATION_SPRAY | Freq: Every day | NASAL | Status: DC

## 2013-01-27 NOTE — Progress Notes (Signed)
   Subjective:    Patient ID: Leslie Crawford, female    DOB: 1950-06-14, 63 y.o.   MRN: [id number]  HPI Pt presents to clinic with sinus problems for the last several months.  Then this am her facial pain and pressure worsened.  She has an appt with ENT in about 10d but she felt so bad this am she did not know if she could wait for the appt.  She used all her nasacort and has been using intermittent saline washes in her sinuses.  She feels really congested but she is not really getting anything out with the washes.  OTC meds - saline washes  Review of Systems  Constitutional: Negative for fever and chills.  HENT: Positive for congestion, postnasal drip and rhinorrhea.   Respiratory: Cough: tickle.   Musculoskeletal: Negative for myalgias.  Neurological: Positive for headaches.       Objective:   Physical Exam  Vitals reviewed. Constitutional: She is oriented to person, place, and time. She appears well-developed and well-nourished.  HENT:  Head: Normocephalic and atraumatic.  Right Ear: Hearing, tympanic membrane, external ear and ear canal normal.  Left Ear: Hearing, tympanic membrane, external ear and ear canal normal.  Nose: Nasal deformity (? polyp on left side) present. Mucosal edema: red.  Mouth/Throat: Uvula is midline, oropharynx is clear and moist and mucous membranes are normal.  Cardiovascular: Normal rate, regular rhythm and normal heart sounds.   No murmur heard. Pulmonary/Chest: Effort normal. She has no wheezes.  Neurological: She is alert and oriented to person, place, and time.  Skin: Skin is warm and dry.  Psychiatric: She has a normal mood and affect. Her behavior is normal. Judgment and thought content normal.       Assessment & Plan:  Sinus infection - Plan: triamcinolone (NASACORT AQ) 55 MCG/ACT AERO nasal inhaler, amoxicillin (AMOXIL) 875 MG tablet, Guaifenesin (MUCINEX MAXIMUM STRENGTH) 1200 MG TB12  Cough - Plan: benzonatate (TESSALON) 100 MG  capsule  Windell Hummingbird PA-C 01/27/2013 2:37 PM

## 2013-04-20 ENCOUNTER — Other Ambulatory Visit: Payer: Self-pay | Admitting: Otolaryngology

## 2013-05-24 ENCOUNTER — Telehealth: Payer: Self-pay | Admitting: Oncology

## 2013-05-24 NOTE — Telephone Encounter (Signed)
kk out pt to see cp2 6/5. s/w pt she is aware and requested that lab be moved to 5/28.

## 2013-06-01 ENCOUNTER — Ambulatory Visit: Payer: Private Health Insurance - Indemnity | Admitting: Oncology

## 2013-06-01 ENCOUNTER — Other Ambulatory Visit: Payer: Private Health Insurance - Indemnity

## 2013-06-05 ENCOUNTER — Encounter: Payer: Self-pay | Admitting: Physician Assistant

## 2013-06-05 DIAGNOSIS — J329 Chronic sinusitis, unspecified: Secondary | ICD-10-CM | POA: Insufficient documentation

## 2013-06-20 ENCOUNTER — Telehealth: Payer: Self-pay | Admitting: Oncology

## 2013-06-20 NOTE — Telephone Encounter (Signed)
moved 5/28 lb to 10:30am due to surv day. s/w pt she is aware.

## 2013-06-22 ENCOUNTER — Other Ambulatory Visit (HOSPITAL_BASED_OUTPATIENT_CLINIC_OR_DEPARTMENT_OTHER): Payer: Private Health Insurance - Indemnity

## 2013-06-22 DIAGNOSIS — C50212 Malignant neoplasm of upper-inner quadrant of left female breast: Secondary | ICD-10-CM

## 2013-06-22 DIAGNOSIS — C50219 Malignant neoplasm of upper-inner quadrant of unspecified female breast: Secondary | ICD-10-CM

## 2013-06-22 LAB — CBC WITH DIFFERENTIAL/PLATELET
BASO%: 0.5 % (ref 0.0–2.0)
Basophils Absolute: 0 10*3/uL (ref 0.0–0.1)
EOS ABS: 0.2 10*3/uL (ref 0.0–0.5)
EOS%: 3.2 % (ref 0.0–7.0)
HCT: 39.5 % (ref 34.8–46.6)
HGB: 13.3 g/dL (ref 11.6–15.9)
LYMPH%: 27.9 % (ref 14.0–49.7)
MCH: 30.4 pg (ref 25.1–34.0)
MCHC: 33.7 g/dL (ref 31.5–36.0)
MCV: 90.2 fL (ref 79.5–101.0)
MONO#: 0.5 10*3/uL (ref 0.1–0.9)
MONO%: 8.1 % (ref 0.0–14.0)
NEUT#: 3.7 10*3/uL (ref 1.5–6.5)
NEUT%: 60.3 % (ref 38.4–76.8)
Platelets: 257 10*3/uL (ref 145–400)
RBC: 4.38 10*6/uL (ref 3.70–5.45)
RDW: 13.3 % (ref 11.2–14.5)
WBC: 6.2 10*3/uL (ref 3.9–10.3)
lymph#: 1.7 10*3/uL (ref 0.9–3.3)

## 2013-06-22 LAB — COMPREHENSIVE METABOLIC PANEL (CC13)
ALT: 16 U/L (ref 0–55)
AST: 22 U/L (ref 5–34)
Albumin: 4 g/dL (ref 3.5–5.0)
Alkaline Phosphatase: 85 U/L (ref 40–150)
Anion Gap: 10 mEq/L (ref 3–11)
BUN: 14.3 mg/dL (ref 7.0–26.0)
CALCIUM: 9.6 mg/dL (ref 8.4–10.4)
CHLORIDE: 106 meq/L (ref 98–109)
CO2: 23 mEq/L (ref 22–29)
Creatinine: 0.9 mg/dL (ref 0.6–1.1)
GLUCOSE: 85 mg/dL (ref 70–140)
Potassium: 4.5 mEq/L (ref 3.5–5.1)
Sodium: 139 mEq/L (ref 136–145)
Total Bilirubin: 0.71 mg/dL (ref 0.20–1.20)
Total Protein: 6.7 g/dL (ref 6.4–8.3)

## 2013-06-26 ENCOUNTER — Encounter: Payer: Self-pay | Admitting: Family Medicine

## 2013-06-26 ENCOUNTER — Telehealth: Payer: Self-pay | Admitting: Internal Medicine

## 2013-06-26 ENCOUNTER — Ambulatory Visit (INDEPENDENT_AMBULATORY_CARE_PROVIDER_SITE_OTHER): Payer: Managed Care, Other (non HMO) | Admitting: Family Medicine

## 2013-06-26 VITALS — BP 147/82 | HR 63 | Temp 99.0°F | Resp 16 | Ht 64.0 in | Wt 175.0 lb

## 2013-06-26 DIAGNOSIS — G43909 Migraine, unspecified, not intractable, without status migrainosus: Secondary | ICD-10-CM

## 2013-06-26 DIAGNOSIS — G4700 Insomnia, unspecified: Secondary | ICD-10-CM

## 2013-06-26 DIAGNOSIS — L74519 Primary focal hyperhidrosis, unspecified: Secondary | ICD-10-CM

## 2013-06-26 DIAGNOSIS — R61 Generalized hyperhidrosis: Secondary | ICD-10-CM

## 2013-06-26 MED ORDER — TOPIRAMATE 50 MG PO TABS: 50.0000 mg | ORAL_TABLET | Freq: Two times a day (BID) | ORAL | Status: DC

## 2013-06-26 NOTE — Patient Instructions (Addendum)
Results for orders placed in visit on 06/22/13  CBC WITH DIFFERENTIAL      Result Value Ref Range   WBC 6.2  3.9 - 10.3 10e3/uL   NEUT# 3.7  1.5 - 6.5 10e3/uL   HGB 13.3  11.6 - 15.9 g/dL   HCT 39.5  34.8 - 46.6 %   Platelets 257  145 - 400 10e3/uL   MCV 90.2  79.5 - 101.0 fL   MCH 30.4  25.1 - 34.0 pg   MCHC 33.7  31.5 - 36.0 g/dL   RBC 4.38  3.70 - 5.45 10e6/uL   RDW 13.3  11.2 - 14.5 %   lymph# 1.7  0.9 - 3.3 10e3/uL   MONO# 0.5  0.1 - 0.9 10e3/uL   Eosinophils Absolute 0.2  0.0 - 0.5 10e3/uL   Basophils Absolute 0.0  0.0 - 0.1 10e3/uL   NEUT% 60.3  38.4 - 76.8 %   LYMPH% 27.9  14.0 - 49.7 %   MONO% 8.1  0.0 - 14.0 %   EOS% 3.2  0.0 - 7.0 %   BASO% 0.5  0.0 - 2.0 %  COMPREHENSIVE METABOLIC PANEL (WU98)      Result Value Ref Range   Sodium 139  136 - 145 mEq/L   Potassium 4.5  3.5 - 5.1 mEq/L   Chloride 106  98 - 109 mEq/L   CO2 23  22 - 29 mEq/L   Glucose 85  70 - 140 mg/dl   BUN 14.3  7.0 - 26.0 mg/dL   Creatinine 0.9  0.6 - 1.1 mg/dL   Total Bilirubin 0.71  0.20 - 1.20 mg/dL   Alkaline Phosphatase 85  40 - 150 U/L   AST 22  5 - 34 U/L   ALT 16  0 - 55 U/L   Total Protein 6.7  6.4 - 8.3 g/dL   Albumin 4.0  3.5 - 5.0 g/dL   Calcium 9.6  8.4 - 10.4 mg/dL   Anion Gap 10  3 - 11 mEq/L   Blood pressure recheck:  138/74

## 2013-06-26 NOTE — Telephone Encounter (Signed)
pt cld to cancel appt-no expalnation given-stated will call and resch at a later time

## 2013-06-26 NOTE — Progress Notes (Signed)
This is a 63 year old breast cancer survivor. She's now 4 years out from her surgery without any evidence of recurrence.  Patient comes in complaining about sweatiness, weight gain, and some fatigue. She's  recently had blood checked at the [facility]. He's concerned because she's now lost 2 different oncologists there were signed her case after they were terminated by the Hospital.  Patient has been taking citalopram for help with her insomnia. She like to come off of this but is worried about having withdrawal effects.  Objective: Is a very pleasant, articulate woman HEENT: Unremarkable Neck: No adenopathy or thyromegaly Heart: Regular no murmur Chest: Clear  Extremities: No edema Skin: No rash  Assessment: The patient probably do without the citalopram. The sweating episodes are troubling to her and is not clear from the physical exam or history that is causing that.  At any rate, I think she can stop the Celexa and transition to Topamax 50 mg at bedtime to help with sleep, withdrawal from Celexa, and weight loss.  Insomnia - Plan: topiramate (TOPAMAX) 50 MG tablet  Hyperhidrosis - Plan: topiramate (TOPAMAX) 50 MG tablet  Migraines - Plan: topiramate (TOPAMAX) 50 MG tablet  Signed, Robyn Haber, MD

## 2013-06-30 ENCOUNTER — Ambulatory Visit: Payer: Private Health Insurance - Indemnity

## 2013-06-30 ENCOUNTER — Other Ambulatory Visit: Payer: Private Health Insurance - Indemnity

## 2013-08-24 ENCOUNTER — Other Ambulatory Visit: Payer: Self-pay | Admitting: Family Medicine

## 2013-08-24 DIAGNOSIS — Z9889 Other specified postprocedural states: Secondary | ICD-10-CM

## 2013-08-24 DIAGNOSIS — Z853 Personal history of malignant neoplasm of breast: Secondary | ICD-10-CM

## 2013-11-09 ENCOUNTER — Encounter: Payer: Managed Care, Other (non HMO) | Admitting: Family Medicine

## 2013-11-17 ENCOUNTER — Other Ambulatory Visit: Payer: Self-pay | Admitting: Family Medicine

## 2013-11-17 ENCOUNTER — Ambulatory Visit
Admission: RE | Admit: 2013-11-17 | Discharge: 2013-11-17 | Disposition: A | Discharge status: Home / Self Care | Payer: Private Health Insurance - Indemnity | Source: Ambulatory Visit | Attending: Family Medicine | Admitting: Family Medicine

## 2013-11-17 DIAGNOSIS — Z9889 Other specified postprocedural states: Secondary | ICD-10-CM

## 2013-11-17 DIAGNOSIS — N6320 Unspecified lump in the left breast, unspecified quadrant: Secondary | ICD-10-CM

## 2013-11-17 DIAGNOSIS — Z853 Personal history of malignant neoplasm of breast: Secondary | ICD-10-CM

## 2013-11-21 ENCOUNTER — Other Ambulatory Visit: Payer: Self-pay | Admitting: Radiology

## 2013-11-21 DIAGNOSIS — Z0000 Encounter for general adult medical examination without abnormal findings: Secondary | ICD-10-CM

## 2013-12-07 ENCOUNTER — Other Ambulatory Visit: Payer: Self-pay | Admitting: Obstetrics and Gynecology

## 2013-12-08 LAB — CYTOLOGY - PAP

## 2013-12-14 ENCOUNTER — Ambulatory Visit (INDEPENDENT_AMBULATORY_CARE_PROVIDER_SITE_OTHER): Payer: Managed Care, Other (non HMO) | Admitting: Family Medicine

## 2013-12-14 ENCOUNTER — Encounter: Payer: Self-pay | Admitting: Family Medicine

## 2013-12-14 VITALS — BP 125/85 | HR 64 | Temp 98.3°F | Resp 16 | Ht 63.5 in | Wt 171.0 lb

## 2013-12-14 DIAGNOSIS — S60042A Contusion of left ring finger without damage to nail, initial encounter: Secondary | ICD-10-CM

## 2013-12-14 DIAGNOSIS — G43809 Other migraine, not intractable, without status migrainosus: Secondary | ICD-10-CM

## 2013-12-14 DIAGNOSIS — Z0000 Encounter for general adult medical examination without abnormal findings: Secondary | ICD-10-CM

## 2013-12-14 DIAGNOSIS — G43109 Migraine with aura, not intractable, without status migrainosus: Secondary | ICD-10-CM

## 2013-12-14 LAB — POCT URINALYSIS DIPSTICK
Bilirubin, UA: NEGATIVE
Blood, UA: NEGATIVE
Glucose, UA: NEGATIVE
Ketones, UA: NEGATIVE
Leukocytes, UA: NEGATIVE
Nitrite, UA: NEGATIVE
Protein, UA: NEGATIVE
Spec Grav, UA: 1.01
Urobilinogen, UA: 0.2
pH, UA: 6.5

## 2013-12-14 LAB — CBC WITH DIFFERENTIAL/PLATELET
Basophils Absolute: 0 10*3/uL (ref 0.0–0.1)
Basophils Relative: 0 % (ref 0–1)
Eosinophils Absolute: 0.2 10*3/uL (ref 0.0–0.7)
Eosinophils Relative: 2 % (ref 0–5)
HCT: 42.9 % (ref 36.0–46.0)
Hemoglobin: 14.8 g/dL (ref 12.0–15.0)
Lymphocytes Relative: 22 % (ref 12–46)
Lymphs Abs: 1.7 10*3/uL (ref 0.7–4.0)
MCH: 31 pg (ref 26.0–34.0)
MCHC: 34.5 g/dL (ref 30.0–36.0)
MCV: 89.7 fL (ref 78.0–100.0)
MPV: 8.1 fL — ABNORMAL LOW (ref 9.4–12.4)
Monocytes Absolute: 0.5 10*3/uL (ref 0.1–1.0)
Monocytes Relative: 7 % (ref 3–12)
Neutro Abs: 5.2 10*3/uL (ref 1.7–7.7)
Neutrophils Relative %: 69 % (ref 43–77)
Platelets: 679 10*3/uL — ABNORMAL HIGH (ref 150–400)
RBC: 4.78 MIL/uL (ref 3.87–5.11)
RDW: 13.4 % (ref 11.5–15.5)
WBC: 7.5 10*3/uL (ref 4.0–10.5)

## 2013-12-14 LAB — LIPID PANEL
Cholesterol: 290 mg/dL — ABNORMAL HIGH (ref 0–200)
HDL: 77 mg/dL (ref >39)
LDL Cholesterol: 194 mg/dL — ABNORMAL HIGH (ref 0–99)
Total CHOL/HDL Ratio: 3.8 Ratio
Triglycerides: 95 mg/dL (ref <150)
VLDL: 19 mg/dL (ref 0–40)

## 2013-12-14 LAB — COMPREHENSIVE METABOLIC PANEL
ALT: 19 U/L (ref 0–35)
AST: 26 U/L (ref 0–37)
Albumin: 4.7 g/dL (ref 3.5–5.2)
Alkaline Phosphatase: 81 U/L (ref 39–117)
BUN: 11 mg/dL (ref 6–23)
CO2: 25 mEq/L (ref 19–32)
Calcium: 10.3 mg/dL (ref 8.4–10.5)
Chloride: 104 mEq/L (ref 96–112)
Creat: 0.86 mg/dL (ref 0.50–1.10)
Glucose, Bld: 89 mg/dL (ref 70–99)
Potassium: 4.9 mEq/L (ref 3.5–5.3)
Sodium: 141 mEq/L (ref 135–145)
Total Bilirubin: 1 mg/dL (ref 0.2–1.2)
Total Protein: 7.2 g/dL (ref 6.0–8.3)

## 2013-12-14 LAB — TSH: TSH: 2.68 u[IU]/mL (ref 0.350–4.500)

## 2013-12-14 LAB — VITAMIN B12: Vitamin B-12: >2000 pg/mL — ABNORMAL HIGH (ref 211–911)

## 2013-12-14 MED ORDER — ZOSTER VACCINE LIVE 19400 UNT/0.65ML ~~LOC~~ SOLR: 0.6500 mL | Freq: Once | SUBCUTANEOUS | Status: DC

## 2013-12-14 NOTE — Patient Instructions (Signed)

## 2013-12-14 NOTE — Progress Notes (Signed)
Patient ID: Leslie Crawford MRN: [id number], DOB: [birth year], 63 y.o. Date of Encounter: 12/14/2013, 10:40 AM  Primary Physician: Kathlene November, MD  Chief Complaint: Physical (CPE)  HPI: 63 y.o. y/o female with history of breast cancer, allergies, and anxiety who reports today for her annual physical. Pt reports walking one half hour per day, and reducing serving sizes to manage weight. Pt reports she is doing well, and has no serious acute issues/complaints. Pt reported to her OBGYN last week and her exam was normal. Pt reports mild soreness on her the PIP joint of her ring finger, left hand. Pt denies MOI.   Cleans teeth fastidiously  Still working for [employer]  Pap: last week MMG: last week Last VZ:CHYIFOY years ago Review of Systems: Consitutional: No fever, chills, fatigue, night sweats, lymphadenopathy, or weight changes. Eyes: No visual changes, eye redness, or discharge. ENT/Mouth: Ears: No otalgia, tinnitus, hearing loss, discharge. Nose: No congestion, rhinorrhea, sinus pain, or epistaxis. Throat: No sore throat, post nasal drip, or teeth pain. Cardiovascular: No CP, palpitations, diaphoresis, DOE, edema, orthopnea, PND. Respiratory: No cough, hemoptysis, SOB, or wheezing. Gastrointestinal: No anorexia, dysphagia, reflux, pain, nausea, vomiting, hematemesis, diarrhea, constipation, BRBPR, or melena. Breast: No discharge, pain, swelling, or mass. Genitourinary: No dysuria, frequency, urgency, hematuria, incontinence, nocturia, amenorrhea, vaginal discharge, pruritis, burning, abnormal bleeding, or pain. Musculoskeletal: No decreased ROM, myalgias, stiffness, joint swelling, or weakness. Skin: No rash, erythema, lesion changes, pain, warmth, jaundice, or pruritis. Neurological: No headache, dizziness, syncope, seizures, tremors, memory loss, coordination problems, or paresthesias. Psychological: No anxiety, depression, hallucinations, SI/HI. Endocrine: No fatigue, polydipsia,  polyphagia, polyuria, or known diabetes. All other systems were reviewed and are otherwise negative.  Past Medical History  Diagnosis Date  . Anxiety   . Depression   . Breast cancer 2010    S/P Chemo/RadTx- Dr. Eston Esters  . Osteopenia   . Allergy      Past Surgical History  Procedure Laterality Date  . Tonsillectomy and adenoidectomy    . Breast lumpectomy  10/29/2008    Negative markers- 1 positive node  . Abdominal hysterectomy      Total hysterectomy with BSO    Home Meds:  Prior to Admission medications   Medication Sig Start Date End Date Taking? Authorizing Provider  Biotin 10 MG TABS Take 1 tablet by mouth daily.    Yes Historical Provider, MD  cholecalciferol (VITAMIN D) 1000 UNITS tablet Take 2,000 Units by mouth daily.   Yes Historical Provider, MD  cyanocobalamin 2000 MCG tablet Take 2,000 mcg by mouth daily.   Yes Historical Provider, MD  fluticasone (FLONASE) 50 MCG/ACT nasal spray Place into both nostrils daily.   Yes Historical Provider, MD  magnesium 30 MG tablet Take 30 mg by mouth 2 (two) times daily.   Yes Historical Provider, MD  triamcinolone (NASACORT AQ) 55 MCG/ACT AERO nasal inhaler Place 2 sprays into the nose daily. 01/27/13  Yes Mancel Bale, PA-C  topiramate (TOPAMAX) 50 MG tablet Take 1 tablet (50 mg total) by mouth 2 (two) times daily. 06/26/13   Robyn Haber, MD    Allergies: No Known Allergies  History   Social History  . Marital Status: Married    Spouse Name: N/A    Number of Children: N/A  . Years of Education: N/A   Occupational History  . Not on file.   Social History Main Topics  . Smoking status: Former Research scientist (life sciences)  . Smokeless tobacco: Never Used  . Alcohol Use: 3.0 oz/week  5 Cans of beer per week  . Drug Use: No  . Sexual Activity: Yes    Birth Control/ Protection: Surgical   Other Topics Concern  . Not on file   Social History Narrative    Family History  Problem Relation Age of Onset  . Cancer Mother      Throat cancer  . Cancer Father     Stomach cancer  . Early death Maternal Grandmother   . Pneumonia Maternal Grandmother   . Heart attack Maternal Grandfather   . Heart attack Paternal Grandfather     Physical Exam: Blood pressure 125/85, pulse 64, temperature 98.3 F (36.8 C), temperature source Oral, resp. rate 16, height 5' 3.5" (1.613 m), weight 171 lb (77.565 kg), SpO2 98 %., Body mass index is 29.81 kg/(m^2). Wt Readings from Last 3 Encounters:  12/14/13 171 lb (77.565 kg)  06/26/13 175 lb (79.379 kg)  01/27/13 178 lb (80.74 kg)   BP Readings from Last 3 Encounters:  12/14/13 125/85  06/26/13 147/82  01/27/13 128/60   General: Well developed, well nourished, in no acute distress. HEENT: Normocephalic, atraumatic. Conjunctiva pink, sclera non-icteric. Pupils 2 mm constricting to 1 mm, round, regular, and equally reactive to light and accomodation. EOMI. Internal auditory canal clear. TMs with good cone of light and without pathology. Nasal mucosa pink. Nares are without discharge. No sinus tenderness. Oral mucosa pink. Dentition excellent. Pharynx without exudate.    Neck: Supple. Trachea midline. No thyromegaly. Full ROM. No lymphadenopathy. Lungs: Clear to auscultation bilaterally without wheezes, rales, or rhonchi. Breathing is of normal effort and unlabored. Cardiovascular: RRR with S1 S2. No murmurs, rubs, or gallops appreciated. Distal pulses 2+ symmetrically. No carotid or abdominal bruits. Abdomen: Soft, non-tender, non-distended with normoactive bowel sounds. No hepatosplenomegaly or masses. No rebound/guarding. No CVA tenderness. Without hernias.  Musculoskeletal: Full range of motion and 5/5 strength throughout. Without swelling, atrophy, tenderness, crepitus, or warmth. Extremities without clubbing, cyanosis, or edema. Calves supple.  Mildly swollen left finger PIP with some erythema Skin: Warm and moist without erythema, ecchymosis, wounds, or rash. Neuro: A+Ox3. CN  II-XII grossly intact. Moves all extremities spontaneously. Full sensation throughout. Normal gait. DTR 2+ throughout upper and lower extremities. Finger to nose intact. Psych:  Responds to questions appropriately with a normal affect.   Lab Results  Component Value Date   CHOL 310* 09/06/2012   HDL 95* 09/06/2012   LDLCALC 196 09/06/2012   LDLDIRECT 196.2 08/01/2009   TRIG 95 09/06/2012   CHOLHDL 4 08/01/2009    Assessment/Plan:  63 y.o. 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

## 2013-12-25 ENCOUNTER — Telehealth: Payer: Self-pay

## 2013-12-25 NOTE — Telephone Encounter (Signed)
Pt is concerned about her platelet count being over 600. Please advise.

## 2013-12-25 NOTE — Telephone Encounter (Signed)
Patient was given her lab results from 12/14/13 but has questions. Please call patient [phone]

## 2014-04-04 ENCOUNTER — Ambulatory Visit (INDEPENDENT_AMBULATORY_CARE_PROVIDER_SITE_OTHER): Payer: Managed Care, Other (non HMO) | Admitting: Family Medicine

## 2014-04-04 VITALS — BP 128/84 | HR 85 | Temp 98.1°F | Resp 18 | Wt 169.0 lb

## 2014-04-04 DIAGNOSIS — R42 Dizziness and giddiness: Secondary | ICD-10-CM

## 2014-04-04 NOTE — Patient Instructions (Signed)
Good to see you today!  Your symptoms are non- specific.  I think they do not represent anything serious, but I would like for you to let me know if this happens again For the time being continue your allergy treatments If you have recurrent sx we may need to consider an MRI or a diagnosis of Meniere's disease  Meniere Disease Meniere disease is an inner ear disorder. It causes attacks of a spinning sensation (vertigo) and ringing in the ear (tinnitus). It also causes hearing loss and a sensation of fullness or pressure in your ear.  Meniere disease is lifelong. It may get worse over time. Symptoms usually begin in one ear but may eventually affect both ears.  CAUSES Meniere disease is caused by having too much of the fluid that is in your inner ear (endolymph). When endolymph builds up in your inner ear, it affects the nerves that control balance and hearing. The reason for the endolymph buildup is not known. Possible causes include:  Allergy.  An abnormal reaction of the body's defense system (autoimmune disease).  Viral infection of the inner ear.  Head injury. RISK FACTORS  Age older than 40 years.  Family history of Meniere disease.  History of autoimmune disease.  History of migraine headaches. SIGNS AND SYMPTOMS Symptoms of Meniere disease can come and go and may last for up to 4 hours at a time. Symptoms usually start in one ear and may become more frequent and eventually involve both ears. Symptoms can include:  Fullness and pressure in your ear.  Roaring or ringing in your ear.  Vertigo and loss of balance.  Decreased hearing.  Nausea and vomiting. DIAGNOSIS Your health care provider will perform a physical exam. Tests may be done to confirm a diagnosis of Meniere disease. These tests may include:  A hearing test (audiogram).  An electronystagmogram. This tests your balance nerve (vestibular nerve).  Imaging studies, such as CT or MRI, of your inner  ear. TREATMENT There is no cure for Meniere disease, but it can be managed. Management may include:  A diet that may help relieve symptoms of Meniere disease.  Use of medicines to reduce:  Vertigo.  Nausea.  Fluid retention.  Use of an air pressure pulse generator. This is a machine that sends small pressure pulses into your ear canal.  Inner ear surgery (rare). When you experience symptoms, it can be helpful to lie down on a flat surface and focus your eyes on one object that does not move. Try to stay in that position until your symptoms go away.  HOME CARE INSTRUCTIONS   Take medicines only as directed by your health care provider.  Eat the same amount of food at the same time every day, including snacks.  Do not skip meals.  Limit the salt in your diet to 1,000 mg a day.  Avoid caffeine.  Limit alcoholic drinks to one drink a day.  Do not eat foods containing monosodium glutamate (MSG).  Drink enough fluids to keep your urine clear or pale yellow.  Do not use any tobacco products including cigarettes, chewing tobacco, or electronic cigarettes. If you need help quitting, ask your health care provider.  Find ways to reduce or avoid stress. SEEK MEDICAL CARE IF:   You have symptoms that last longer than 4 hours.  You have new or more severe symptoms. SEEK IMMEDIATE MEDICAL CARE IF:   You have been vomiting for 24 hours.  You are not able to keep fluids  down.  You have chest pain or trouble breathing. Document Released: 01/10/2000 Document Revised: 05/29/2013 Document Reviewed: 12/26/2012 [facility] Patient Information 2015 [place], [place]. This information is not intended to replace advice given to you by your health care provider. Make sure you discuss any questions you have with your health care provider.

## 2014-04-04 NOTE — Progress Notes (Signed)
[facility] [address], [place] [address] [phone]  Date:  04/04/2014   Name:  Leslie Crawford   DOB:  07/18/1950   MRN:  [id number]  PCP:  Kathlene November, MD    Chief Complaint: Dizziness and ringing in right ear   History of Present Illness:  Leslie Crawford is a 64 y.o. very pleasant female patient who presents with the following:  Last night she turned around while standing in her kitchen- she felt very dizzy, like the world was spinning. Her eyes seemed to be moving around on their own. She felt very hot and nauseated.  She held still and her eye movements cleared up in a minute or so, but she felt nauseated for about 3 hours. She also noted a "sizzling" sound in her right ear that lasted for 30- 60 seconds and then resolved She has not had any further vertigo today.   She never had a headache Today her right ear feels full,"like pressure."  However the tinnitus is now gone She has never had vertigo in the past.   She is OW generally healthy She takes claritin and nasocort for AR  BP Readings from Last 3 Encounters:  04/04/14 130/78  12/14/13 125/85  06/26/13 147/82     Patient Active Problem List   Diagnosis Date Noted  . BACK PAIN 01/06/2010  . ADENOCARCINOMA, BREAST 10/26/2008  . ANXIETY 10/26/2008  . HYPERLIPIDEMIA 07/17/2008  . GERD 07/01/2007  . OSTEOPENIA 07/01/2007  . INSOMNIA-SLEEP DISORDER-UNSPEC 07/01/2007    Past Medical History  Diagnosis Date  . Anxiety   . Depression   . Breast cancer 2010    S/P Chemo/RadTx- Dr. Eston Esters  . Osteopenia   . Allergy     Past Surgical History  Procedure Laterality Date  . Tonsillectomy and adenoidectomy    . Breast lumpectomy  10/29/2008    Negative markers- 1 positive node  . Abdominal hysterectomy      Total hysterectomy with BSO    History  Substance Use Topics  . Smoking status: Former Research scientist (life sciences)  . Smokeless tobacco: Never Used  . Alcohol Use: 3.0 oz/week    5 Cans of  beer per week    Family History  Problem Relation Age of Onset  . Cancer Mother     Throat cancer  . Cancer Father     Stomach cancer  . Early death Maternal Grandmother   . Pneumonia Maternal Grandmother   . Heart attack Maternal Grandfather   . Heart attack Paternal Grandfather     Allergies  Allergen Reactions  . Topamax [Topiramate] Other (See Comments)    Decreased cognitive function    Medication list has been reviewed and updated.  Current Outpatient Prescriptions on File Prior to Visit  Medication Sig Dispense Refill  . Biotin 10 MG TABS Take 1 tablet by mouth daily.     . cholecalciferol (VITAMIN D) 1000 UNITS tablet Take 2,000 Units by mouth daily.    . magnesium 30 MG tablet Take 30 mg by mouth 2 (two) times daily.    Marland Kitchen triamcinolone (NASACORT AQ) 55 MCG/ACT AERO nasal inhaler Place 2 sprays into the nose daily. 1 Inhaler 0   No current facility-administered medications on file prior to visit.    Review of Systems:  As per HPI- otherwise negative.   Physical Examination: Filed Vitals:   04/04/14 0848  BP: 130/78  Pulse: 85  Temp: 98.1 F (36.7 C)  Resp: 18  Filed Vitals:   04/04/14 0848  Weight: 169 lb (76.658 kg)   Body mass index is 29.46 kg/(m^2). Ideal Body Weight:    GEN: WDWN, NAD, Non-toxic, A & O x 3, overweight, looks well and healthy HEENT: Atraumatic, Normocephalic. Neck supple. No masses, No LAD.  Bilateral TM wnl, oropharynx normal.  PEERL,EOMI.  Ears and Nose: No external deformity. CV: RRR, No M/G/R. No JVD. No thrill. No extra heart sounds. PULM: CTA B, no wheezes, crackles, rhonchi. No retractions. No resp. distress. No accessory muscle use. ABD: S, NT, ND EXTR: No c/c/e NEURO Normal gait.  PSYCH: Normally interactive. Conversant. Not depressed or anxious appearing.  Calm demeanor.  Normal complete neuro exam including facial nerves, facial movement, sensation, strength and DTR all extremities, tandem gait and Romberg.   Negative dix- halpike bilaterally   Assessment and Plan: Vertigo  Discrete episode of vertigo that occurred yesterday.  At this point she looks and feels well.  Possible Meniere's disease, but as she just had one episode will observe for now. She will let us know if she has recurrent sx.  She feels comfortable with this plan   Signed Lamar Blinks, MD

## 2014-07-31 ENCOUNTER — Encounter: Payer: Self-pay | Admitting: Gastroenterology

## 2014-08-27 LAB — LIPID PANEL
CHOLESTEROL: 298 — AB (ref 0–200)
HDL: 75 — AB (ref 35–70)
LDL CALC: 197
TRIGLYCERIDES: 132 (ref 40–160)

## 2014-10-17 ENCOUNTER — Other Ambulatory Visit: Payer: Self-pay

## 2014-10-17 ENCOUNTER — Other Ambulatory Visit: Payer: Self-pay | Admitting: Family Medicine

## 2014-10-17 DIAGNOSIS — Z853 Personal history of malignant neoplasm of breast: Secondary | ICD-10-CM

## 2014-12-07 ENCOUNTER — Ambulatory Visit
Admission: RE | Admit: 2014-12-07 | Discharge: 2014-12-07 | Disposition: A | Discharge status: Home / Self Care | Payer: 59 | Source: Ambulatory Visit | Attending: Family Medicine | Admitting: Family Medicine

## 2014-12-07 DIAGNOSIS — Z853 Personal history of malignant neoplasm of breast: Secondary | ICD-10-CM

## 2015-03-26 ENCOUNTER — Other Ambulatory Visit: Payer: Self-pay | Admitting: Dermatology

## 2015-10-21 ENCOUNTER — Other Ambulatory Visit: Payer: Self-pay | Admitting: Family Medicine

## 2015-10-21 DIAGNOSIS — Z853 Personal history of malignant neoplasm of breast: Secondary | ICD-10-CM

## 2015-10-21 DIAGNOSIS — Z9889 Other specified postprocedural states: Secondary | ICD-10-CM

## 2016-01-14 ENCOUNTER — Ambulatory Visit
Admission: RE | Admit: 2016-01-14 | Discharge: 2016-01-14 | Disposition: A | Discharge status: Home / Self Care | Payer: 59 | Source: Ambulatory Visit | Attending: Family Medicine | Admitting: Family Medicine

## 2016-01-14 DIAGNOSIS — Z9889 Other specified postprocedural states: Secondary | ICD-10-CM

## 2016-01-14 DIAGNOSIS — Z853 Personal history of malignant neoplasm of breast: Secondary | ICD-10-CM

## 2016-01-22 ENCOUNTER — Encounter: Payer: Self-pay | Admitting: *Deleted

## 2016-05-22 ENCOUNTER — Telehealth: Payer: Self-pay

## 2016-05-22 NOTE — Telephone Encounter (Signed)
Called patient for pre visit information. Patient updated medications and allergies then stated she filled out forms and will bring them in with her information. States she is at work and does not have time to talk right now.

## 2016-05-25 ENCOUNTER — Ambulatory Visit (INDEPENDENT_AMBULATORY_CARE_PROVIDER_SITE_OTHER): Payer: 59 | Admitting: Family Medicine

## 2016-05-25 ENCOUNTER — Encounter: Payer: Self-pay | Admitting: Family Medicine

## 2016-05-25 VITALS — BP 120/78 | HR 78 | Temp 98.7°F | Ht 63.5 in | Wt 159.2 lb

## 2016-05-25 DIAGNOSIS — E785 Hyperlipidemia, unspecified: Secondary | ICD-10-CM | POA: Diagnosis not present

## 2016-05-25 DIAGNOSIS — M8580 Other specified disorders of bone density and structure, unspecified site: Secondary | ICD-10-CM | POA: Diagnosis not present

## 2016-05-25 DIAGNOSIS — Z1159 Encounter for screening for other viral diseases: Secondary | ICD-10-CM

## 2016-05-25 DIAGNOSIS — Z1329 Encounter for screening for other suspected endocrine disorder: Secondary | ICD-10-CM

## 2016-05-25 DIAGNOSIS — Z853 Personal history of malignant neoplasm of breast: Secondary | ICD-10-CM

## 2016-05-25 DIAGNOSIS — Z23 Encounter for immunization: Secondary | ICD-10-CM

## 2016-05-25 DIAGNOSIS — Z131 Encounter for screening for diabetes mellitus: Secondary | ICD-10-CM

## 2016-05-25 LAB — LIPID PANEL
CHOL/HDL RATIO: 4
Cholesterol: 277 mg/dL — ABNORMAL HIGH (ref 0–200)
HDL: 69.7 mg/dL (ref >39.00)
LDL Cholesterol: 180 mg/dL — ABNORMAL HIGH (ref 0–99)
NONHDL: 207.31
TRIGLYCERIDES: 139 mg/dL (ref 0.0–149.0)
VLDL: 27.8 mg/dL (ref 0.0–40.0)

## 2016-05-25 LAB — COMPREHENSIVE METABOLIC PANEL
ALT: 18 U/L (ref 0–35)
AST: 23 U/L (ref 0–37)
Albumin: 4.4 g/dL (ref 3.5–5.2)
Alkaline Phosphatase: 77 U/L (ref 39–117)
BUN: 13 mg/dL (ref 6–23)
CHLORIDE: 105 meq/L (ref 96–112)
CO2: 26 mEq/L (ref 19–32)
CREATININE: 0.75 mg/dL (ref 0.40–1.20)
Calcium: 10 mg/dL (ref 8.4–10.5)
GFR: 81.97 mL/min (ref >60.00)
Glucose, Bld: 88 mg/dL (ref 70–99)
POTASSIUM: 4.1 meq/L (ref 3.5–5.1)
SODIUM: 138 meq/L (ref 135–145)
Total Bilirubin: 1 mg/dL (ref 0.2–1.2)
Total Protein: 7 g/dL (ref 6.0–8.3)

## 2016-05-25 LAB — VITAMIN D 25 HYDROXY (VIT D DEFICIENCY, FRACTURES): VITD: 42.14 ng/mL (ref 30.00–100.00)

## 2016-05-25 LAB — HEMOGLOBIN A1C: HEMOGLOBIN A1C: 5.2 % (ref 4.6–6.5)

## 2016-05-25 LAB — HEPATITIS C ANTIBODY: HCV Ab: NEGATIVE

## 2016-05-25 NOTE — Progress Notes (Signed)
Pre visit review using our clinic tool,if applicable. No additional management support is needed unless otherwise documented below in the visit note.  

## 2016-05-25 NOTE — Progress Notes (Signed)
[place] [facility] [address] [place], [place] [address] [phone]  Date:  05/25/2016   Name:  Leslie Crawford   DOB:  08/20/1950   MRN:  [id number]  PCP:  Leslie Blinks, MD    Chief Complaint: Establish Care   History of Present Illness:  Leslie Crawford is a 66 y.o. very pleasant female patient who presents with the following:  Here today as a new patient to my practice, although I did see her at [facility] about 2 years ago.  Her PCP had been Leslie Crawford who is now retired  History of breast cancer 8 years ago, hyperlipidemia, GERD, osteopenia.  She works for [employer].  Working full time, is not currently thinking of retirement Diagnosed with breast cancer in 2010- she had surgery, chemo and radiation. She does not have an oncologist at this time- her doctor left the practice and she no longer needs oncology follow-up.  She is not on tamoxifen or other oral meds as she was receptor negative.   She thinks that her only necessary follow-up at this time is regular mammograms  She is fasting today for labs History of dyslipidemia- she has never been on any medication for this.  High cholesterol does run in her family She is taking temazpeam 15 mg prn for sleep from her GYN doctor.  She sees Leslie Crawford at [facility] She had a bone density about 2 years ago She will   She has not yet had her shingles vaccine or pneumonia vaccine- would like to have these today  She is due for a colonoscopy next year, as well as her tetanus shot next year  She is married to Leslie Crawford, their son is 82 yo and lives in [place],  They do not have any grands as of yet  She exercises about 30 minutes a day by walking with her dog- no CP or SOB with exercise. She also plans to start yoga soon- will be offered at her job  Former smoker, limited alcohol  Patient Active Problem List   Diagnosis Date Noted  . BACK PAIN 01/06/2010  . ADENOCARCINOMA, BREAST  10/26/2008  . ANXIETY 10/26/2008  . HYPERLIPIDEMIA 07/17/2008  . GERD 07/01/2007  . OSTEOPENIA 07/01/2007  . INSOMNIA-SLEEP DISORDER-UNSPEC 07/01/2007    Past Medical History:  Diagnosis Date  . Allergy   . Anemia   . Anxiety   . Breast cancer ([place]) 2010   S/P Chemo/RadTx- Dr. Eston Esters  . Depression   . GERD (gastroesophageal reflux disease)   . Hyperlipidemia   . Osteopenia   . Osteoporosis     Past Surgical History:  Procedure Laterality Date  . ABDOMINAL HYSTERECTOMY     Total hysterectomy with BSO  . BREAST LUMPECTOMY  10/29/2008   Negative markers- 1 positive node  . TONSILLECTOMY AND ADENOIDECTOMY      Social History  Substance Use Topics  . Smoking status: Former Research scientist (life sciences)  . Smokeless tobacco: Never Used  . Alcohol use 3.0 oz/week    5 Cans of beer per week    Family History  Problem Relation Age of Onset  . Cancer Mother     Throat cancer  . Cancer Father     Stomach cancer  . Early death Maternal Grandmother   . Pneumonia Maternal Grandmother   . Heart attack Maternal Grandfather   . Heart attack Paternal Grandfather     Allergies  Allergen Reactions  . Topamax [Topiramate]  Other (See Comments)    Decreased cognitive function    Medication list has been reviewed and updated.  Current Outpatient Prescriptions on File Prior to Visit  Medication Sig Dispense Refill  . loratadine (CLARITIN) 10 MG tablet Take 10 mg by mouth daily.    . magnesium 30 MG tablet Take 30 mg by mouth 2 (two) times daily.    Marland Kitchen triamcinolone (NASACORT AQ) 55 MCG/ACT AERO nasal inhaler Place 2 sprays into the nose daily. 1 Inhaler 0  . Biotin 10 MG TABS Take 1 tablet by mouth daily.     . cholecalciferol (VITAMIN D) 1000 UNITS tablet Take 2,000 Units by mouth daily.    . temazepam (RESTORIL) 15 MG capsule Take 15 mg by mouth at bedtime as needed for sleep.     No current facility-administered medications on file prior to visit.     Review of Systems:  As per HPI-  otherwise negative.   Physical Examination: Vitals:   05/25/16 0901  BP: 120/78  Pulse: 78  Temp: 98.7 F (37.1 C)   Vitals:   05/25/16 0901  Weight: 159 lb 3.2 oz (72.2 kg)  Height: 5' 3.5" (1.613 m)   Body mass index is 27.76 kg/m. Ideal Body Weight: Weight in (lb) to have BMI = 25: 143.1  GEN: WDWN, NAD, Non-toxic, A & O x 3, mild overweight, looks well HEENT: Atraumatic, Normocephalic. Neck supple. No masses, No LAD.  Bilateral TM wnl, oropharynx normal.  PEERL,EOMI. Ears and Nose: No external deformity. CV: RRR, No M/G/R. No JVD. No thrill. No extra heart sounds. PULM: CTA B, no wheezes, crackles, rhonchi. No retractions. No resp. distress. No accessory muscle use. ABD: S, NT, ND EXTR: No c/c/e NEURO Normal gait.  PSYCH: Normally interactive. Conversant. Not depressed or anxious appearing.  Calm demeanor.    Assessment and Plan: Dyslipidemia - Plan: Lipid panel  Immunization due - Plan: Pneumococcal conjugate vaccine 13-valent IM, Varicella-zoster vaccine IM (Shingrix)  Osteopenia, unspecified location - Plan: Vitamin D (25 hydroxy)  History of breast cancer  Encounter for hepatitis C screening test for low risk patient - Plan: Hepatitis C antibody  Screening for thyroid disorder - Plan: TSH  Screening for diabetes mellitus - Plan: Comprehensive metabolic panel, Hemoglobin A1c  Labs pending as above- I will be in touch with her and plan next visit.  Assuming all is well can plan to see her in 6-12 months.   History of dyslipidemia not currently on any treatment. Will check lipids for her today  Signed Leslie Blinks, MD

## 2016-05-25 NOTE — Patient Instructions (Signed)
It was a pleasure to see you today!  Take care and I will be in touch with your labs asap You got your first shingles shot today- 2nd dose due in 2-6 months You also got your first pneumonia shot today- the "prevnar 13."  In a year you will be due for the "pneumovax 23," your last pneumonia shot

## 2016-08-25 ENCOUNTER — Ambulatory Visit: Payer: 59

## 2016-09-25 ENCOUNTER — Telehealth: Payer: Self-pay

## 2016-09-25 NOTE — Telephone Encounter (Signed)
Pt scheduled  

## 2016-09-30 ENCOUNTER — Ambulatory Visit (INDEPENDENT_AMBULATORY_CARE_PROVIDER_SITE_OTHER): Payer: 59 | Admitting: Behavioral Health

## 2016-09-30 DIAGNOSIS — Z23 Encounter for immunization: Secondary | ICD-10-CM | POA: Diagnosis not present

## 2016-09-30 NOTE — Progress Notes (Signed)
Pre visit review using our clinic review tool, if applicable. No additional management support is needed unless otherwise documented below in the visit note.  Patient came in clinic today for 2nd Shingrix vaccination. IM injection was given in the right deltoid. Patient tolerated injection well. No signs or symptoms of a reaction before leaving the nurse visit.

## 2016-10-06 LAB — LIPID PANEL
Cholesterol: 327 — AB (ref 0–200)
HDL: 74 — AB (ref 35–70)
LDL Cholesterol: 223
Triglycerides: 146 (ref 40–160)

## 2016-10-15 ENCOUNTER — Emergency Department (HOSPITAL_COMMUNITY)
Admission: EM | Admit: 2016-10-15 | Discharge: 2016-10-16 | Disposition: A | Discharge status: Home / Self Care | Payer: 59 | Attending: Emergency Medicine | Admitting: Emergency Medicine

## 2016-10-15 ENCOUNTER — Telehealth: Payer: Self-pay | Admitting: Medical

## 2016-10-15 ENCOUNTER — Encounter (HOSPITAL_COMMUNITY): Payer: Self-pay | Admitting: Emergency Medicine

## 2016-10-15 ENCOUNTER — Ambulatory Visit (INDEPENDENT_AMBULATORY_CARE_PROVIDER_SITE_OTHER): Payer: 59 | Admitting: Medical

## 2016-10-15 ENCOUNTER — Telehealth: Payer: Self-pay | Admitting: Family Medicine

## 2016-10-15 VITALS — BP 170/95 | HR 75 | Temp 97.8°F | Resp 16 | Ht 63.0 in | Wt 160.6 lb

## 2016-10-15 DIAGNOSIS — Z79899 Other long term (current) drug therapy: Secondary | ICD-10-CM | POA: Insufficient documentation

## 2016-10-15 DIAGNOSIS — F801 Expressive language disorder: Secondary | ICD-10-CM | POA: Insufficient documentation

## 2016-10-15 DIAGNOSIS — I10 Essential (primary) hypertension: Secondary | ICD-10-CM | POA: Insufficient documentation

## 2016-10-15 DIAGNOSIS — R51 Headache: Secondary | ICD-10-CM | POA: Diagnosis present

## 2016-10-15 DIAGNOSIS — F419 Anxiety disorder, unspecified: Secondary | ICD-10-CM | POA: Insufficient documentation

## 2016-10-15 DIAGNOSIS — F329 Major depressive disorder, single episode, unspecified: Secondary | ICD-10-CM | POA: Diagnosis not present

## 2016-10-15 DIAGNOSIS — G43109 Migraine with aura, not intractable, without status migrainosus: Secondary | ICD-10-CM

## 2016-10-15 DIAGNOSIS — R479 Unspecified speech disturbances: Secondary | ICD-10-CM

## 2016-10-15 DIAGNOSIS — R4701 Aphasia: Secondary | ICD-10-CM

## 2016-10-15 DIAGNOSIS — G43819 Other migraine, intractable, without status migrainosus: Secondary | ICD-10-CM | POA: Insufficient documentation

## 2016-10-15 DIAGNOSIS — Z853 Personal history of malignant neoplasm of breast: Secondary | ICD-10-CM | POA: Diagnosis not present

## 2016-10-15 DIAGNOSIS — Z87891 Personal history of nicotine dependence: Secondary | ICD-10-CM | POA: Diagnosis not present

## 2016-10-15 LAB — URINALYSIS, ROUTINE W REFLEX MICROSCOPIC
BILIRUBIN URINE: NEGATIVE
Bacteria, UA: NONE SEEN
GLUCOSE, UA: NEGATIVE mg/dL
HGB URINE DIPSTICK: NEGATIVE
KETONES UR: NEGATIVE mg/dL
LEUKOCYTES UA: NEGATIVE
NITRITE: NEGATIVE
PH: 6 (ref 5.0–8.0)
PROTEIN: NEGATIVE mg/dL
Specific Gravity, Urine: 1.005 (ref 1.005–1.030)

## 2016-10-15 LAB — CBC
HCT: 43 % (ref 36.0–46.0)
Hemoglobin: 15 g/dL (ref 12.0–15.0)
MCH: 30.3 pg (ref 26.0–34.0)
MCHC: 34.9 g/dL (ref 30.0–36.0)
MCV: 86.9 fL (ref 78.0–100.0)
PLATELETS: 246 10*3/uL (ref 150–400)
RBC: 4.95 MIL/uL (ref 3.87–5.11)
RDW: 13.4 % (ref 11.5–15.5)
WBC: 9 10*3/uL (ref 4.0–10.5)

## 2016-10-15 LAB — BASIC METABOLIC PANEL
Anion gap: 12 (ref 5–15)
BUN: 11 mg/dL (ref 6–20)
CHLORIDE: 104 mmol/L (ref 101–111)
CO2: 23 mmol/L (ref 22–32)
CREATININE: 0.82 mg/dL (ref 0.44–1.00)
Calcium: 9.8 mg/dL (ref 8.9–10.3)
Glucose, Bld: 101 mg/dL — ABNORMAL HIGH (ref 65–99)
Potassium: 4 mmol/L (ref 3.5–5.1)
SODIUM: 139 mmol/L (ref 135–145)

## 2016-10-15 NOTE — Patient Instructions (Addendum)
For your recent aphasia and ocular migraine today approximately 2 hours ago, I do think it is best to be seen at the emergency department at [facility]). I did attempt to get advice from your neurologist Dr. Sima Matas and left a message. In addition call the second time but no answer directly.  So I do think the main [facility] is the best option as our emergency department downstairs can do a CT but they cannot do an MRI.(mri may be indicated)  Also your blood pressure(170/95) may need to be brought down if still elevated.  I talked with Dr. Lorelei Pont and she agrees with being seen at the main emergency department. Please show them this summary. You will be triaged and might have a wait. If you have any recurrent symptoms while waiting please inform triage person of your change in signs or symptoms. If you have any questions I also will give my cell phone number [phone].  Follow-up with PCP or with myself as advised by the emergency department.

## 2016-10-15 NOTE — Telephone Encounter (Signed)
Patient went to ED today.

## 2016-10-15 NOTE — Telephone Encounter (Signed)
Would you call patient tomorrow morning and see how she is doing. I'll look at the computer and tried to follow emergency department visit and not sure if she was seen? I don't see any imaging studies done or any notes other than triage note?  See how she is doing. Any headache, nausea, vision changes, dizziness, extremity weakness, or numbness? Any problems speaking?    I have put in referral to her neurologist. If they did not get ED evaluation I'm trying to get them neurologist appointment by early next week.  Also did she have a blood pressure cuff at home. Would like to know what her blood pressure is?  Also would you call patient's neurologist Claudie Fisherman. Her number is [phone].  If I can get her on the phone on like to get her advice on yesterday's presentation.  Before you make the above calls first check and see if there is any documentation in Epic that she was actually seen at the ED. But if there is no documentation then go ahead and make the calls. If you see that she was seen and there is imaging studies and please let me know so I can review that.

## 2016-10-15 NOTE — Telephone Encounter (Signed)
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 She had one earlier. She was trying to talk and she could not get words out and she was feeling confused with what she was trying to say.    Nurse Assessment  Nurse: Raphael Gibney, RN, Vera Date/Time Eilene Ghazi Time): 10/15/2016 12:26:24 PM  Confirm and document reason for call. If symptomatic, describe symptoms. 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 Had one earlier today. Earlier today, she could not talk and could not get her words out. Could not talk for 10 min. Ocular migraines affects her vision but it is ok now. No aphasia now.  Does the patient have any new or worsening symptoms? ---Yes  Will a triage be completed? ---Yes  Related visit to physician within the last 2 weeks? ---No  Does the PT have any chronic conditions? (i.e. diabetes, asthma, etc.) ---Yes  List chronic conditions. ---ocular migraines  Is this a behavioral health or substance abuse call? ---No     Guidelines    Guideline Title Affirmed Question Affirmed Notes  Neurologic Deficit [1] Loss of speech or garbled speech AND [2] sudden onset AND [3] brief (now gone)    Final Disposition User   Go to ED Now (or PCP triage) Raphael Gibney, RN, Vanita Ingles    Comments  office is open; appt scheduled for 10/15/2016 at 1:15 pm with Percell Miller Saguier vs sending pt to the ER.   Referrals  REFERRED TO PCP OFFICE   Caller Disagree/Comply Comply  Caller Understands Yes  PreDisposition Call Doctor

## 2016-10-15 NOTE — ED Triage Notes (Signed)
Pt was sent from PCP for ocular migraines that today at 11 am she also had a episode where she was until to speak and lasted about 10 minutes and then  resolved. Pt states now her headache has resolved and she is alert and ox4, able to speak no neuro deficits.

## 2016-10-15 NOTE — Progress Notes (Signed)
Subjective:    Patient ID: Leslie Crawford, female    DOB: 1950/08/15, 66 y.o.   MRN: [id number]  HPI  Pt in today with history of ocular migraines. Today late morning had ocular type migraine symptoms. Then after her ocular migraine she was on phone with someone she could not speak well. She states new what she wanted to say but words just could not come out. 2 months ago she had same problem lasted for same duration.    Pt with her ocular migraines she states has difficulty seeing center of things but can see periphery and looks like steam comes off of what she is seeing. This did occur longer than usual as well today. Pt in past has seen neurologist and they told her ocular migarine in the past. No mri done but carotid US done and no significant plaque.  Pt does not have any blood pressure medications. No history of hypertension.  She currently feels good but nervous.  At the time early today no ha, no nauseu, no vomiting or any dizziness.   This occcured at 11:00-11:30. I evaluated the patient around 1:30 in afternoon.    Review of Systems  Constitutional: Negative for chills, fatigue and fever.  HENT: Negative for congestion, drooling, ear discharge, ear pain, mouth sores, postnasal drip and rhinorrhea.   Eyes: Positive for visual disturbance.       Not present but transient during ocular migraine type symptoms.  Respiratory: Negative for cough, chest tightness, shortness of breath and wheezing.   Cardiovascular: Negative for chest pain and palpitations.  Gastrointestinal: Negative for abdominal pain.  Genitourinary: Negative for dysuria.  Musculoskeletal: Negative for back pain, gait problem and myalgias.  Skin: Negative for rash.  Neurological: Negative for dizziness, syncope, facial asymmetry, speech difficulty, weakness and light-headedness.       Aphasia for 10 minutes following ocular migraine.  Hematological: Negative for adenopathy. Does not bruise/bleed easily.    Psychiatric/Behavioral: Negative for behavioral problems, confusion, decreased concentration, dysphoric mood, self-injury, sleep disturbance and suicidal ideas. The patient is nervous/anxious.     Past Medical History:  Diagnosis Date  . Allergy   . Anemia   . Anxiety   . Breast cancer ([place]) 2010   S/P Chemo/RadTx- Dr. Eston Esters  . Depression   . GERD (gastroesophageal reflux disease)   . Hyperlipidemia   . Osteopenia   . Osteoporosis      Social History   Social History  . Marital status: Married    Spouse name: N/A  . Number of children: N/A  . Years of education: N/A   Occupational History  . Not on file.   Social History Main Topics  . Smoking status: Former Research scientist (life sciences)  . Smokeless tobacco: Never Used  . Alcohol use 3.0 oz/week    5 Cans of beer per week  . Drug use: No  . Sexual activity: Yes    Birth control/ protection: Surgical   Other Topics Concern  . Not on file   Social History Narrative  . No narrative on file    Past Surgical History:  Procedure Laterality Date  . ABDOMINAL HYSTERECTOMY     Total hysterectomy with BSO  . BREAST LUMPECTOMY  10/29/2008   Negative markers- 1 positive node  . TONSILLECTOMY AND ADENOIDECTOMY      Family History  Problem Relation Age of Onset  . Cancer Mother        Throat cancer  . Cancer Father  Stomach cancer  . Early death Maternal Grandmother   . Pneumonia Maternal Grandmother   . Heart attack Maternal Grandfather   . Heart attack Paternal Grandfather     Allergies  Allergen Reactions  . Topamax [Topiramate] Other (See Comments)    Decreased cognitive function    Current Outpatient Prescriptions on File Prior to Visit  Medication Sig Dispense Refill  . magnesium 30 MG tablet Take 30 mg by mouth 2 (two) times daily.    . temazepam (RESTORIL) 15 MG capsule Take 15 mg by mouth at bedtime as needed for sleep.     No current facility-administered medications on file prior to visit.     BP  (!) 170/75   Pulse 75   Temp 97.8 F (36.6 C) (Oral)   Resp 16   Ht 5\' 3"  (1.6 m)   Wt 160 lb 9.6 oz (72.8 kg)   SpO2 100%   BMI 28.45 kg/m       Objective:   Physical Exam  General Mental Status- Alert. General Appearance- Not in acute distress.   Skin General: Color- Normal Color. Moisture- Normal Moisture.  Neck Carotid Arteries- Normal color. Moisture- Normal Moisture. No carotid bruits. No JVD.  Chest and Lung Exam Auscultation: Breath Sounds:-Normal.  Cardiovascular Auscultation:Rythm- Regular. Murmurs & Other Heart Sounds:Auscultation of the heart reveals- No Murmurs.  Abdomen Inspection:-Inspeection Normal. Palpation/Percussion:Note:No mass. Palpation and Percussion of the abdomen reveal- Non Tender, Non Distended + BS, no rebound or guarding.    Neurologic Cranial Nerve exam:- CN III-XII intact(No nystagmus), symmetric smile. Drift Test:- No drift. Romberg Exam:- Negative.  Heal to Toe Gait exam:-Normal. Finger to Nose:- Normal/Intact Strength:- 5/5 equal and symmetric strength both upper and lower extremities.       Assessment & Plan:  For your recent aphasia and ocular migraine today approximately 2 hours ago, I do think it is best to be seen at the emergency department at [facility]). I did attempt to get advice from your neurologist Dr. Sima Matas and left a message. In addition call the second time but no answer directly.  So I do think the main [facility] is the best option as our emergency department downstairs can do a CT but they cannot do an MRI.(mri may be indicated)  Also your blood pressure(170/95) may need to be brought down if still elevated.  I talked with Dr. Lorelei Pont and she agrees with being seen at the main emergency department. Please show them this summary. You will be triaged and might have a wait. If you have any recurrent symptoms while waiting please inform triage person of your change in signs or symptoms. If you have  any questions I also will give my cell phone number [phone].(Note no phone call received from patient)  Follow-up with PCP or with myself as advised by the emergency department.  Later in the day I checked to see if she went to the emergency department. It appears that she did go and was triaged. Then I don't see any notes or workup. I don't see any imaging studies? So not sure if the patient stayed.  On the go ahead and put in a referral to patients neurologist. Also will try to get staff to call their office again as I tried to today. If I'm able to talk to neurologist will get advice on workup. At that point not sure what neurologist would recommend so late out from her symptoms/signs. But would order whatever is advised to possibly facilitate the neurologist  appointment. (Patient had probable TIA)  Ariyah Sedlack, Percell Miller, PA-C

## 2016-10-15 NOTE — ED Provider Notes (Signed)
[facility] DEPT Provider Note   CSN: [id number] Arrival date & time: 10/15/16  1430     History   Chief Complaint Chief Complaint  Patient presents with  . Headache    HPI Leslie Crawford is a 66 y.o. female.  HPI  Ocular migraines diagnosed by Ophthalmologist Today was having one and mouth wasn't saying what brain wanted it to. Lasted about 10 minutes.   2-3 months ago this also happened, was feeling nervous at the time.  Went to primary care physician today. BP was 846 systolic while at PCP and was sent here for further evaluation.   This morning had visual changes "like heat off the street", waves in both eyes while at work- that lasted about 15-20 minutes, then afterwards had episode where was unable to say what she wanted to say, like she was saying words but didn't recognize what she was saying, "i know that wasn't what I wanted to say, but i'm not sure what I just said.", that lasted about 10 minutes.  Had dull headache at that time.  No other facial droop, numbness, weakness, no other change in vision, no trouble walking.   No htn, no DM, no smoking, no afib  Past Medical History:  Diagnosis Date  . Allergy   . Anemia   . Anxiety   . Breast cancer ([place]) 2010   S/P Chemo/RadTx- Dr. Eston Esters  . Depression   . GERD (gastroesophageal reflux disease)   . Hyperlipidemia   . Osteopenia   . Osteoporosis     Patient Active Problem List   Diagnosis Date Noted  . BACK PAIN 01/06/2010  . ADENOCARCINOMA, BREAST 10/26/2008  . ANXIETY 10/26/2008  . HYPERLIPIDEMIA 07/17/2008  . GERD 07/01/2007  . OSTEOPENIA 07/01/2007  . INSOMNIA-SLEEP DISORDER-UNSPEC 07/01/2007    Past Surgical History:  Procedure Laterality Date  . ABDOMINAL HYSTERECTOMY     Total hysterectomy with BSO  . BREAST LUMPECTOMY  10/29/2008   Negative markers- 1 positive node  . TONSILLECTOMY AND ADENOIDECTOMY      OB History    No data available       Home Medications    Prior to  Admission medications   Medication Sig Start Date End Date Taking? Authorizing Provider  magnesium 30 MG tablet Take 30 mg by mouth 2 (two) times daily.    [provider]  temazepam (RESTORIL) 15 MG capsule Take 15 mg by mouth at bedtime as needed for sleep.    [provider]    Family History Family History  Problem Relation Age of Onset  . Cancer Mother        Throat cancer  . Cancer Father        Stomach cancer  . Early death Maternal Grandmother   . Pneumonia Maternal Grandmother   . Heart attack Maternal Grandfather   . Heart attack Paternal Grandfather     Social History Social History  Substance Use Topics  . Smoking status: Former Research scientist (life sciences)  . Smokeless tobacco: Never Used  . Alcohol use 3.0 oz/week    5 Cans of beer per week     Allergies   Topamax [topiramate]   Review of Systems Review of Systems  Constitutional: Negative for fever.  HENT: Negative for sore throat.   Eyes: Positive for visual disturbance.  Respiratory: Negative for cough and shortness of breath.   Cardiovascular: Negative for chest pain.  Gastrointestinal: Negative for abdominal pain, diarrhea, nausea and vomiting.  Genitourinary: Negative for difficulty  urinating.  Musculoskeletal: Negative for back pain and neck pain.  Skin: Negative for rash.  Neurological: Positive for speech difficulty and headaches. Negative for syncope, facial asymmetry, weakness and numbness.     Physical Exam Updated Vital Signs BP (!) 157/60   Pulse 71   Temp 98 F (36.7 C) (Oral)   Resp 17   SpO2 98%   Physical Exam  Constitutional: She is oriented to person, place, and time. She appears well-developed and well-nourished. No distress.  HENT:  Head: Normocephalic and atraumatic.  Eyes: Conjunctivae and EOM are normal.  Neck: Normal range of motion.  Cardiovascular: Normal rate, regular rhythm, normal heart sounds and intact distal pulses.  Exam reveals no gallop and no friction rub.    No murmur heard. Pulmonary/Chest: Effort normal and breath sounds normal. No respiratory distress. She has no wheezes. She has no rales.  Abdominal: Soft. She exhibits no distension. There is no tenderness. There is no guarding.  Musculoskeletal: She exhibits no edema or tenderness.  Neurological: She is alert and oriented to person, place, and time. She has normal strength. No cranial nerve deficit or sensory deficit. Coordination and gait normal. GCS eye subscore is 4. GCS verbal subscore is 5. GCS motor subscore is 6.  Skin: Skin is warm and dry. No rash noted. She is not diaphoretic. No erythema.  Nursing note and vitals reviewed.    ED Treatments / Results  Labs (all labs ordered are listed, but only abnormal results are displayed) Labs Reviewed  BASIC METABOLIC PANEL - Abnormal; Notable for the following:       Result Value   Glucose, Bld 101 (*)    All other components within normal limits  URINALYSIS, ROUTINE W REFLEX MICROSCOPIC - Abnormal; Notable for the following:    Color, Urine STRAW (*)    Squamous Epithelial / LPF 0-5 (*)    All other components within normal limits  CBC  CBG MONITORING, ED    EKG  EKG Interpretation None       Radiology No results found.  Procedures Procedures (including critical care time)  Medications Ordered in ED Medications - No data to display   Initial Impression / Assessment and Plan / ED Course  I have reviewed the triage vital signs and the nursing notes.  Pertinent labs & imaging results that were available during my care of the patient were reviewed by me and considered in my medical decision making (see chart for details).     66 year old female with a history of hyperlipidemia, breast cancer in 2010 status post chemotherapy and radiation, ocular migraines, who presents with concern for visual disturbance consistent with ocular migraines followed by an episode of difficulty speaking and dull headache.  Reports symptoms  have resolved, neurologic exam at this time WNL.  Discussed with Dr. Nicole Kindred of Neurology on the phone regarding whether she is appropriate for TIA admission or whether symptoms are consistent with complicated migraine.  Given visual changes consistent with prior ocular migraines preceding these, feel most likely it represented complicated migraine if MRI is normal.  Will obtain MR brain to further evaluate for signs of CVA.  If MR negative, will plan on discharge with Neurology follow up, initiate low dose htn medication.   Final Clinical Impressions(s) / ED Diagnoses   Final diagnoses:  Difficulty speaking  Ocular migraine    New Prescriptions New Prescriptions   No medications on file     Gareth Morgan, MD 10/16/16 [phone]

## 2016-10-16 ENCOUNTER — Emergency Department (HOSPITAL_COMMUNITY): Payer: 59

## 2016-10-16 MED ORDER — AMLODIPINE BESYLATE 5 MG PO TABS: 5.0000 mg | ORAL_TABLET | Freq: Every day | ORAL | 0 refills | Status: DC

## 2016-10-16 NOTE — ED Notes (Signed)
Patient transported to MRI 

## 2016-10-16 NOTE — ED Provider Notes (Signed)
MRI negative for acute findings.  patient reports just a dull headache. Her neurological exam is nonfocal.  Patient to follow up with neurology as outpatient per plan of Dr. Billy Fischer. Amlodipine will be started for HTN.  BP (!) 157/60   Pulse 66   Temp 98 F (36.7 C) (Oral)   Resp 17   SpO2 97%     Ezequiel Essex, MD 10/16/16 [phone]

## 2016-10-16 NOTE — Telephone Encounter (Signed)
Pt was seen in ED yesterday.  

## 2016-10-16 NOTE — Discharge Instructions (Signed)
Follow up with the neurologist. Take your blood pressure medications as prescribed. Return to the ED if you develop new or worsening symptoms.

## 2016-10-19 ENCOUNTER — Encounter: Payer: Self-pay | Admitting: Neurology

## 2016-10-21 ENCOUNTER — Ambulatory Visit (INDEPENDENT_AMBULATORY_CARE_PROVIDER_SITE_OTHER): Payer: 59 | Admitting: Family Medicine

## 2016-10-21 VITALS — BP 145/84 | HR 74 | Temp 97.8°F | Ht 63.0 in | Wt 158.6 lb

## 2016-10-21 DIAGNOSIS — I10 Essential (primary) hypertension: Secondary | ICD-10-CM | POA: Diagnosis not present

## 2016-10-21 DIAGNOSIS — G43109 Migraine with aura, not intractable, without status migrainosus: Secondary | ICD-10-CM

## 2016-10-21 NOTE — Progress Notes (Signed)
[facility] at [facility] [address], [place], [place] [address] [phone]  Date:  10/21/2016   Name:  Leslie Crawford   DOB:  12-16-1950   MRN:  [id number]  PCP:  Darreld Mclean, MD    Chief Complaint: Follow-up (ED- )   History of Present Illness:  Leslie Crawford is a 66 y.o. very pleasant female patient who presents with the following:  She was seen in the ER on 9/20 with apparent occular migraine:  66 year old female with a history of hyperlipidemia, breast cancer in 2010 status post chemotherapy and radiation, ocular migraines, who presents with concern for visual disturbance consistent with ocular migraines followed by an episode of difficulty speaking and dull headache.  Reports symptoms have resolved, neurologic exam at this time WNL.  Discussed with Dr. Nicole Kindred of Neurology on the phone regarding whether she is appropriate for TIA admission or whether symptoms are consistent with complicated migraine.  Given visual changes consistent with prior ocular migraines preceding these, feel most likely it represented complicated migraine if MRI is normal.  Will obtain MR brain to further evaluate for signs of CVA.  If MR negative, will plan on discharge with Neurology follow up, initiate low dose htn medication.   Mr Brain Wo Contrast  Result Date: 10/16/2016 CLINICAL DATA:  Ocular migraines. Speech abnormality. EXAM: MRI HEAD WITHOUT CONTRAST TECHNIQUE: Multiplanar, multiecho pulse sequences of the brain and surrounding structures were obtained without intravenous contrast. COMPARISON:  None. FINDINGS: Brain: The midline structures are normal. There is no focal diffusion restriction to indicate acute infarct. There is beginning confluent hyperintense T2-weighted signal within the periventricular, deep and juxtacortical white matter, most often seen in the setting of chronic microvascular ischemia. No intraparenchymal hematoma or  chronic microhemorrhage. Brain volume is normal for age without lobar predominant atrophy. The dura is normal and there is no extra-axial collection. Vascular: Major intracranial arterial and venous sinus flow voids are preserved. Skull and upper cervical spine: The visualized skull base, calvarium, upper cervical spine and extracranial soft tissues are normal. Sinuses/Orbits: No fluid levels or advanced mucosal thickening. No mastoid or middle ear effusion. Normal orbits. 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 No acute abnormality. Electronically Signed   By: Ulyses Jarred M.D.   On: 10/16/2016 02:41   She is now back to normal- she does feel a bit tired.  Her MRI did not show any stroke thankfully Pt reports that she has occular migraines for many years- no headache, but her vision will be abnormal for a period of time However last Thursday after the migraine ended she felt like her brain and her mouth were not connected .  She would think one thing and say another.  This lasted for about 10 minutes- she went to the ER as this was concerning  She is not having any numbness, weakness, slurred speech since she got home from the ER   BP Readings from Last 3 Encounters:  10/21/16 (!) 145/84  10/15/16 (!) 157/60  10/15/16 (!) 170/95    BP in April was 120/78 She has never had HTN in the past- her BP has always been fine However she notes that her older sister also developed HTN in her later years  She did see a neurologist in April of 2016- they did her carotid artery ultrasound for her She is seeing [facility] in November- her previous neurologist cannot see her until  February   She is tolerating her 5mg  of amlodipine ok  She does not have a home BP cuff.  She she plans to get  She gets her bone density every 2 years per her GYN She is 8 years breast cancer free  Patient Active Problem List   Diagnosis Date Noted  . BACK PAIN 01/06/2010   . ADENOCARCINOMA, BREAST 10/26/2008  . ANXIETY 10/26/2008  . HYPERLIPIDEMIA 07/17/2008  . GERD 07/01/2007  . OSTEOPENIA 07/01/2007  . INSOMNIA-SLEEP DISORDER-UNSPEC 07/01/2007    Past Medical History:  Diagnosis Date  . Allergy   . Anemia   . Anxiety   . Breast cancer ([place]) 2010   S/P Chemo/RadTx- Dr. Eston Esters  . Depression   . GERD (gastroesophageal reflux disease)   . Hyperlipidemia   . Osteopenia   . Osteoporosis     Past Surgical History:  Procedure Laterality Date  . ABDOMINAL HYSTERECTOMY     Total hysterectomy with BSO  . BREAST LUMPECTOMY  10/29/2008   Negative markers- 1 positive node  . TONSILLECTOMY AND ADENOIDECTOMY      Social History  Substance Use Topics  . Smoking status: Former Research scientist (life sciences)  . Smokeless tobacco: Never Used  . Alcohol use 3.0 oz/week    5 Cans of beer per week    Family History  Problem Relation Age of Onset  . Cancer Mother        Throat cancer  . Cancer Father        Stomach cancer  . Early death Maternal Grandmother   . Pneumonia Maternal Grandmother   . Heart attack Maternal Grandfather   . Heart attack Paternal Grandfather     Allergies  Allergen Reactions  . Topamax [Topiramate] Other (See Comments)    Decreased cognitive function    Medication list has been reviewed and updated.  Current Outpatient Prescriptions on File Prior to Visit  Medication Sig Dispense Refill  . amLODipine (NORVASC) 5 MG tablet Take 1 tablet (5 mg total) by mouth daily. 30 tablet 0  . magnesium 30 MG tablet Take 30 mg by mouth 2 (two) times daily.    . temazepam (RESTORIL) 15 MG capsule Take 15 mg by mouth at bedtime as needed for sleep.     No current facility-administered medications on file prior to visit.     Review of Systems:  As per HPI- otherwise negative. No fever or chills Overall feels well now No chest pain or SOB No nausea, vomiting or diarrhea   Physical Examination: Vitals:   10/21/16 1551 10/21/16 1554  BP: (!)  153/79 (!) 145/84  Pulse: 74   Temp: 97.8 F (36.6 C)   SpO2: 100%    Vitals:   10/21/16 1551  Weight: 158 lb 9.6 oz (71.9 kg)  Height: 5\' 3"  (1.6 m)   Body mass index is 28.09 kg/m. Ideal Body Weight: Weight in (lb) to have BMI = 25: 140.8  GEN: WDWN, NAD, Non-toxic, A & O x 3 HEENT: Atraumatic, Normocephalic. Neck supple. No masses, No LAD.  Bilateral TM wnl, oropharynx normal.  PEERL,EOMI.   Ears and Nose: No external deformity. CV: RRR, No M/G/R. No JVD. No thrill. No extra heart sounds. PULM: CTA B, no wheezes, crackles, rhonchi. No retractions. No resp. distress. No accessory muscle use. ABD: S, NT, ND, +BS. No rebound. No HSM. EXTR: No c/c/e NEURO Normal gait. Normal strength, sensation and DTR of all extremities, normal facial ROM  PSYCH: Normally interactive. Conversant. Not depressed or anxious  appearing.  Calm demeanor.  Looks well.    Assessment and Plan: Benign essential hypertension  Ocular migraine  Here today to follow-up from a recent ER visit She had an apparent ocular migraine with concerning anxillary sx, underwent an MRI which did not show any acute disease. She was also noted to have moderately high BP  She is feeling back to normal tolerate ing her amlodipine 5 ok, but her BP is still a bit high - will have her try 7.5 mg, she will update me regarding her BP next week   Signed Lamar Blinks, MD

## 2016-10-21 NOTE — Patient Instructions (Signed)
It was good to see you today- always a pleasure! Please try increasing your amlodipine to 7.5 mg a day.  If you would have someone at work check your BP next week and please let me know how it looks- we can then refill your medication with the appropriate dose Please let me know if you have any other concerning symptoms, and see neurology in November as planned

## 2016-10-26 ENCOUNTER — Encounter: Payer: Self-pay | Admitting: Family Medicine

## 2016-11-03 MED ORDER — AMLODIPINE BESYLATE 5 MG PO TABS: 5.0000 mg | ORAL_TABLET | Freq: Every day | ORAL | 6 refills | Status: DC

## 2016-11-03 MED ORDER — LISINOPRIL 5 MG PO TABS: 5.0000 mg | ORAL_TABLET | Freq: Every day | ORAL | 6 refills | Status: DC

## 2016-11-03 NOTE — Addendum Note (Signed)
Addended by: Lamar Blinks C on: 11/03/2016 12:41 PM   Modules accepted: Orders

## 2016-11-03 NOTE — Addendum Note (Signed)
Addended by: Lamar Blinks C on: 11/03/2016 12:51 PM   Modules accepted: Orders

## 2016-11-04 MED ORDER — AMLODIPINE BESYLATE 5 MG PO TABS: 5.0000 mg | ORAL_TABLET | Freq: Every day | ORAL | 3 refills | Status: DC

## 2016-11-04 NOTE — Addendum Note (Signed)
Addended by: Lamar Blinks C on: 11/04/2016 07:54 PM   Modules accepted: Orders

## 2016-11-16 ENCOUNTER — Encounter: Payer: Self-pay | Admitting: Family Medicine

## 2016-11-26 ENCOUNTER — Ambulatory Visit (INDEPENDENT_AMBULATORY_CARE_PROVIDER_SITE_OTHER): Payer: 59 | Admitting: Neurology

## 2016-11-26 ENCOUNTER — Encounter: Payer: Self-pay | Admitting: Neurology

## 2016-11-26 VITALS — BP 112/66 | HR 81 | Ht 64.0 in | Wt 155.0 lb

## 2016-11-26 DIAGNOSIS — G43109 Migraine with aura, not intractable, without status migrainosus: Secondary | ICD-10-CM | POA: Diagnosis not present

## 2016-11-26 NOTE — Progress Notes (Signed)
NEUROLOGY CONSULTATION NOTE  Leslie Crawford MRN: [id number] DOB: 1950/11/04  Referring provider: ED referral Primary care provider: Dr. Lorelei Pont  Reason for consult:  migraine  HISTORY OF PRESENT ILLNESS: Leslie Crawford is a 66 year old right-handed female who presents for migraines.  History supplemented by ED and PCP's notes.  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  They present as a central scotoma with wavy lines like heat off of a street, lasting 5 minutes.  There is no associated headache.  Frequency varies.  She may get them twice a week but sometimes she may get them twice a day.  It does not bother her.  On 10/16/16, she had one of her habitual ocular migraines, however it lasted a little longer, about 10-15 minutes.  After it resolved, she was on the phone and couldn't get out the words that she wanted to say.  She would make either sounds or say the wrong word.  This lasted about 10 minutes.  There was no associated headache, dizziness, gait instability or unilateral numbness or weakness.  She immediately went to her PCP's office across the street and her blood pressure was 175/74.  She was instructed to go to the ED where MRI of the brain was prsonally reviewed and showed chronic small vessel ischemic changes but no mass lesion or acute abnormality.  Since then, she has had her habitual ocular migraines but has not had a recurrence of the speech disturbance.  PAST MEDICAL HISTORY: Past Medical History:  Diagnosis Date  . Allergy   . Anemia   . Anxiety   . Breast cancer ([place]) 2010   S/P Chemo/RadTx- Dr. Eston Esters  . Depression   . GERD (gastroesophageal reflux disease)   . Hyperlipidemia   . Osteopenia   . Osteoporosis     PAST SURGICAL HISTORY: Past Surgical History:  Procedure Laterality Date  . ABDOMINAL HYSTERECTOMY     Total hysterectomy with BSO  . BREAST LUMPECTOMY  10/29/2008   Negative markers- 1 positive node  . TONSILLECTOMY AND  ADENOIDECTOMY      MEDICATIONS: Current Outpatient Prescriptions on File Prior to Visit  Medication Sig Dispense Refill  . amLODipine (NORVASC) 5 MG tablet Take 1 tablet (5 mg total) by mouth daily. 90 tablet 3  . lisinopril (PRINIVIL,ZESTRIL) 5 MG tablet Take 1 tablet (5 mg total) by mouth daily. 30 tablet 6  . magnesium 30 MG tablet Take 30 mg by mouth 2 (two) times daily.    . temazepam (RESTORIL) 15 MG capsule Take 15 mg by mouth at bedtime as needed for sleep.     No current facility-administered medications on file prior to visit.     ALLERGIES: Allergies  Allergen Reactions  . Topamax [Topiramate] Other (See Comments)    Decreased cognitive function    FAMILY HISTORY: Family History  Problem Relation Age of Onset  . Cancer Mother        Throat cancer  . Cancer Father        Stomach cancer  . Early death Maternal Grandmother   . Pneumonia Maternal Grandmother   . Heart attack Maternal Grandfather   . Heart attack Paternal Grandfather     SOCIAL HISTORY: Social History   Social History  . Marital status: Married    Spouse name: N/A  . Number of children: N/A  . Years of education: N/A   Occupational History  . Not on file.   Social History Main Topics  .  Smoking status: Former Research scientist (life sciences)  . Smokeless tobacco: Never Used  . Alcohol use 3.0 oz/week    5 Cans of beer per week  . Drug use: No  . Sexual activity: Yes    Birth control/ protection: Surgical   Other Topics Concern  . Not on file   Social History Narrative  . No narrative on file    REVIEW OF SYSTEMS: Constitutional: No fevers, chills, or sweats, no generalized fatigue, change in appetite Eyes: No visual changes, double vision, eye pain Ear, nose and throat: No hearing loss, ear pain, nasal congestion, sore throat Cardiovascular: No chest pain, palpitations Respiratory:  No shortness of breath at rest or with exertion, wheezes GastrointestinaI: No nausea, vomiting, diarrhea, abdominal pain,  fecal incontinence Genitourinary:  No dysuria, urinary retention or frequency Musculoskeletal:  No neck pain, back pain Integumentary: No rash, pruritus, skin lesions Neurological: as above Psychiatric: No depression, insomnia, anxiety Endocrine: No palpitations, fatigue, diaphoresis, mood swings, change in appetite, change in weight, increased thirst Hematologic/Lymphatic:  No purpura, petechiae. Allergic/Immunologic: no itchy/runny eyes, nasal congestion, recent allergic reactions, rashes  PHYSICAL EXAM: Vitals:   11/26/16 1501  BP: 112/66  Pulse: 81  SpO2: 97%   General: No acute distress.  Patient appears well-groomed.  Head:  Normocephalic/atraumatic Eyes:  fundi examined but not visualized Neck: supple, no paraspinal tenderness, full range of motion Back: No paraspinal tenderness Heart: regular rate and rhythm Lungs: Clear to auscultation bilaterally. Vascular: No carotid bruits. Neurological Exam: Mental status: alert and oriented to person, place, and time, recent and remote memory intact, fund of knowledge intact, attention and concentration intact, speech fluent and not dysarthric, language intact. Cranial nerves: CN I: not tested CN II: pupils equal, round and reactive to light, visual fields intact CN III, IV, VI:  full range of motion, no nystagmus, no ptosis CN V: facial sensation intact CN VII: upper and lower face symmetric CN VIII: hearing intact CN IX, X: gag intact, uvula midline CN XI: sternocleidomastoid and trapezius muscles intact CN XII: tongue midline Bulk & Tone: normal, no fasciculations. Motor:  5/5 throughout  Sensation: temperature and vibration sensation intact. Deep Tendon Reflexes:  2+ throughout, toes downgoing.  Finger to nose testing:  Without dysmetria.  Heel to shin:  Without dysmetria.  Gait:  Normal station and stride.  Able to turn and tandem walk. Romberg negative.  IMPRESSION: Complicated migraine.  The speech disturbance was  likely a symptom of migraine that accompanied her habitual ocular migraine aura.  I do not suspect other possible diagnoses such as TIA or seizure  No further workup or recommendations warranted.  Her ocular migraines do not bother her and she does not wish to treat them with a preventative.  Follow up as needed.  Thank you for allowing me to take part in the care of this patient.  Metta Clines, DO  CC:  Lamar Blinks, MD

## 2016-12-01 ENCOUNTER — Encounter: Payer: Self-pay | Admitting: Family Medicine

## 2016-12-01 MED ORDER — LOSARTAN POTASSIUM 50 MG PO TABS: 50.0000 mg | ORAL_TABLET | Freq: Every day | ORAL | 11 refills | Status: DC

## 2016-12-11 ENCOUNTER — Other Ambulatory Visit: Payer: Self-pay | Admitting: Obstetrics and Gynecology

## 2016-12-11 DIAGNOSIS — Z139 Encounter for screening, unspecified: Secondary | ICD-10-CM

## 2017-01-14 ENCOUNTER — Ambulatory Visit
Admission: RE | Admit: 2017-01-14 | Discharge: 2017-01-14 | Disposition: A | Discharge status: Home / Self Care | Payer: 59 | Source: Ambulatory Visit | Attending: Obstetrics and Gynecology | Admitting: Obstetrics and Gynecology

## 2017-01-14 DIAGNOSIS — Z139 Encounter for screening, unspecified: Secondary | ICD-10-CM

## 2017-01-26 HISTORY — PX: BREAST BIOPSY: SHX20

## 2017-03-27 NOTE — Progress Notes (Addendum)
[facility] at [facility] [address] [place], [place] [address] [phone]  Date:  04/01/2017   Name:  Leslie Crawford   DOB:  07/08/1950   MRN:  [id number]  PCP:  Darreld Mclean, MD    Chief Complaint: Annual Exam (Pt here for CPE with fasting labs. Pt has GYN for PAP. )   History of Present Illness:  Leslie Crawford is a 67 y.o. very pleasant female patient who presents with the following:  Here today for a physical and fasting labs History of hyperlipidemia, breast cancer, mild hypertension  Colon: this is her year to do this, Teacher, adult education at [employer].  She does not need a referral  Flu: done, October Pneumonia: had 54 but too early for 23 Zoster: shingrix is done Labs: she is fasting this am  Needs Tdap this year- will give to her today Breast: December- negative Dexa: 2017, at [facility] S/p hysterectomy for benign disease   From our visit about a year ago:  History of breast cancer 8 years ago, hyperlipidemia, GERD, osteopenia.  She works for [employer].  Working full time, is not currently thinking of retirement Diagnosed with breast cancer in 2010- she had surgery, chemo and radiation. She does not have an oncologist at this time- her doctor left the practice and she no longer needs oncology follow-up.  She is not on tamoxifen or other oral meds as she was receptor negative.   She thinks that her only necessary follow-up at this time is regular mammograms  She is fasting today for labs History of dyslipidemia- she has never been on any medication for this.  High cholesterol does run in her family She is taking temazpeam 15 mg prn for sleep from her GYN doctor.  She sees Grewal at [facility] She had a bone density about 2 years ago   She did have an incident in the fall with ocular migraine- she went to the ER as she was concerned about a stroke, and we started her on BP medication as her BP was high. She is currently controlled on  medication. Neurology-  saw Dr. Loretta Plume, he agreed no sign of TIA or stroke She had had some migraines, but no more ocular sx  MRI was normal  BP Readings from Last 3 Encounters:  04/01/17 136/82  11/26/16 112/66  10/21/16 (!) 145/84   She is still on amlodipine 5 and losartan 50 Dr. Helane Rima put her on temazepam last year for insomnia.  She has been using it for a little over a year.  We think she is on 30 mg alhough 15 is on the chart Per [place]; she is indeed on 30 mg, got a one month supply from Dr. Gracy Racer office on 2/27  She plans to retire "when I win the lottery"   Her BP is generally 130/80- at home   She exercises through walking, yoga and pickleball.  No issues with CP or SOB with exercise  Patient Active Problem List   Diagnosis Date Noted  . BACK PAIN 01/06/2010  . ADENOCARCINOMA, BREAST 10/26/2008  . ANXIETY 10/26/2008  . HYPERLIPIDEMIA 07/17/2008  . GERD 07/01/2007  . OSTEOPENIA 07/01/2007  . INSOMNIA-SLEEP DISORDER-UNSPEC 07/01/2007    Past Medical History:  Diagnosis Date  . Allergy   . Anemia   . Anxiety   . Breast cancer ([place]) 2010   S/P Chemo/RadTx- Dr. Eston Esters  . Depression   . GERD (gastroesophageal reflux  disease)   . Hyperlipidemia   . Osteopenia   . Osteoporosis     Past Surgical History:  Procedure Laterality Date  . ABDOMINAL HYSTERECTOMY     Total hysterectomy with BSO  . BREAST LUMPECTOMY Left 10/29/2008   Negative markers- 1 positive node  . TONSILLECTOMY AND ADENOIDECTOMY      Social History   Tobacco Use  . Smoking status: Former Research scientist (life sciences)  . Smokeless tobacco: Never Used  Substance Use Topics  . Alcohol use: Yes    Alcohol/week: 3.0 oz    Types: 5 Cans of beer per week  . Drug use: No    Family History  Problem Relation Age of Onset  . Cancer Mother        Throat cancer  . Cancer Father        Stomach cancer  . Early death Maternal Grandmother   . Pneumonia Maternal Grandmother   . Heart attack Maternal  Grandfather   . Heart attack Paternal Grandfather     Allergies  Allergen Reactions  . Topamax [Topiramate] Other (See Comments)    Decreased cognitive function    Medication list has been reviewed and updated.  Current Outpatient Medications on File Prior to Visit  Medication Sig Dispense Refill  . amLODipine (NORVASC) 5 MG tablet Take 1 tablet (5 mg total) by mouth daily. 90 tablet 3  . losartan (COZAAR) 50 MG tablet Take 1 tablet (50 mg total) daily by mouth. 30 tablet 11  . magnesium 30 MG tablet Take 30 mg by mouth 2 (two) times daily.    . temazepam (RESTORIL) 15 MG capsule Take 15 mg by mouth at bedtime as needed for sleep.     No current facility-administered medications on file prior to visit.     Review of Systems:  As per HPI- otherwise negative. No CP or SOB No fever or chills   Physical Examination: Vitals:   04/01/17 0844  BP: 136/82  Pulse: 72  Temp: 98 F (36.7 C)  SpO2: 99%   Vitals:   04/01/17 0844  Weight: 152 lb 9.6 oz (69.2 kg)  Height: 5' 3.25" (1.607 m)   Body mass index is 26.82 kg/m. Ideal Body Weight: Weight in (lb) to have BMI = 25: 142  GEN: WDWN, NAD, Non-toxic, A & O x 3, looks well HEENT: Atraumatic, Normocephalic. Neck supple. No masses, No LAD.  Bilateral TM wnl, oropharynx normal.  PEERL,EOMI.   Ears and Nose: No external deformity. CV: RRR, No M/G/R. No JVD. No thrill. No extra heart sounds. PULM: CTA B, no wheezes, crackles, rhonchi. No retractions. No resp. distress. No accessory muscle use. ABD: S, NT, ND EXTR: No c/c/e NEURO Normal gait.  PSYCH: Normally interactive. Conversant. Not depressed or anxious appearing.  Calm demeanor.  Right shoulder: she has normal ROM except to internal rotation.  Minor weakness to empty can testing, otherwise normal strength   Assessment and Plan: Physical exam  Dyslipidemia - Plan: Lipid panel  Screening for diabetes mellitus - Plan: Comprehensive metabolic panel, Hemoglobin  A1c  Benign essential hypertension - Plan: CBC, amLODipine (NORVASC) 5 MG tablet, losartan (COZAAR) 50 MG tablet  Immunization due - Plan: Tdap vaccine greater than or equal to 7yo IM  tdap today Will offer PT referral for mild right RC tendonitis Will plan further follow- up pending labs.   Signed Lamar Blinks, MD  Received her labs 3/8 10 year CV risk calculated- 10.6%.  Message to pt  Your blood counts are normal  Metabolic profile is normal Hemoglobin A1c (average blood sugar over the previous 3 months) is normal- no sign of diabetes Your cholesterol is higher than I would like ideally. I calculated your estimated 10 year risk of cardiovascular disease at just over 10%. I do think that a cholesterol medication may help to reduce this risk by improving your cholesterol, and would suggest that we start a low dose for you.  However the choice about medication is always up to you!  What are your thoughts about medication?  If you would like, I will send in an  rx for you to try.   Results for orders placed or performed in visit on 04/01/17  CBC  Result Value Ref Range   WBC 6.6 4.0 - 10.5 K/uL   RBC 4.79 3.87 - 5.11 Mil/uL   Platelets 266.0 150.0 - 400.0 K/uL   Hemoglobin 14.7 12.0 - 15.0 g/dL   HCT 42.4 36.0 - 46.0 %   MCV 88.5 78.0 - 100.0 fl   MCHC 34.7 30.0 - 36.0 g/dL   RDW 13.3 11.5 - 15.5 %  Comprehensive metabolic panel  Result Value Ref Range   Sodium 139 135 - 145 mEq/L   Potassium 4.9 3.5 - 5.1 mEq/L   Chloride 103 96 - 112 mEq/L   CO2 28 19 - 32 mEq/L   Glucose, Bld 95 70 - 99 mg/dL   BUN 13 6 - 23 mg/dL   Creatinine, Ser 0.77 0.40 - 1.20 mg/dL   Total Bilirubin 1.0 0.2 - 1.2 mg/dL   Alkaline Phosphatase 76 39 - 117 U/L   AST 20 0 - 37 U/L   ALT 17 0 - 35 U/L   Total Protein 7.3 6.0 - 8.3 g/dL   Albumin 4.6 3.5 - 5.2 g/dL   Calcium 10.5 8.4 - 10.5 mg/dL   GFR 79.32 >60.00 mL/min  Hemoglobin A1c  Result Value Ref Range   Hgb A1c MFr Bld 5.1 4.6 - 6.5 %   Lipid panel  Result Value Ref Range   Cholesterol 266 (H) 0 - 200 mg/dL   Triglycerides 152.0 (H) 0.0 - 149.0 mg/dL   HDL 74.00 >39.00 mg/dL   VLDL 30.4 0.0 - 40.0 mg/dL   LDL Cholesterol 161 (H) 0 - 99 mg/dL   Total CHOL/HDL Ratio 4    NonHDL 191.68

## 2017-04-01 ENCOUNTER — Ambulatory Visit (INDEPENDENT_AMBULATORY_CARE_PROVIDER_SITE_OTHER): Payer: 59 | Admitting: Family Medicine

## 2017-04-01 ENCOUNTER — Encounter: Payer: Self-pay | Admitting: Family Medicine

## 2017-04-01 VITALS — BP 136/82 | HR 72 | Temp 98.0°F | Ht 63.25 in | Wt 152.6 lb

## 2017-04-01 DIAGNOSIS — E785 Hyperlipidemia, unspecified: Secondary | ICD-10-CM

## 2017-04-01 DIAGNOSIS — Z0000 Encounter for general adult medical examination without abnormal findings: Secondary | ICD-10-CM | POA: Diagnosis not present

## 2017-04-01 DIAGNOSIS — Z131 Encounter for screening for diabetes mellitus: Secondary | ICD-10-CM

## 2017-04-01 DIAGNOSIS — I10 Essential (primary) hypertension: Secondary | ICD-10-CM | POA: Diagnosis not present

## 2017-04-01 DIAGNOSIS — Z23 Encounter for immunization: Secondary | ICD-10-CM

## 2017-04-01 LAB — CBC
HCT: 42.4 % (ref 36.0–46.0)
HEMOGLOBIN: 14.7 g/dL (ref 12.0–15.0)
MCHC: 34.7 g/dL (ref 30.0–36.0)
MCV: 88.5 fl (ref 78.0–100.0)
Platelets: 266 10*3/uL (ref 150.0–400.0)
RBC: 4.79 Mil/uL (ref 3.87–5.11)
RDW: 13.3 % (ref 11.5–15.5)
WBC: 6.6 10*3/uL (ref 4.0–10.5)

## 2017-04-01 LAB — LIPID PANEL
CHOL/HDL RATIO: 4
Cholesterol: 266 mg/dL — ABNORMAL HIGH (ref 0–200)
HDL: 74 mg/dL (ref >39.00)
LDL CALC: 161 mg/dL — AB (ref 0–99)
NonHDL: 191.68
TRIGLYCERIDES: 152 mg/dL — AB (ref 0.0–149.0)
VLDL: 30.4 mg/dL (ref 0.0–40.0)

## 2017-04-01 LAB — COMPREHENSIVE METABOLIC PANEL
ALT: 17 U/L (ref 0–35)
AST: 20 U/L (ref 0–37)
Albumin: 4.6 g/dL (ref 3.5–5.2)
Alkaline Phosphatase: 76 U/L (ref 39–117)
BILIRUBIN TOTAL: 1 mg/dL (ref 0.2–1.2)
BUN: 13 mg/dL (ref 6–23)
CO2: 28 meq/L (ref 19–32)
Calcium: 10.5 mg/dL (ref 8.4–10.5)
Chloride: 103 mEq/L (ref 96–112)
Creatinine, Ser: 0.77 mg/dL (ref 0.40–1.20)
GFR: 79.32 mL/min (ref >60.00)
GLUCOSE: 95 mg/dL (ref 70–99)
POTASSIUM: 4.9 meq/L (ref 3.5–5.1)
Sodium: 139 mEq/L (ref 135–145)
Total Protein: 7.3 g/dL (ref 6.0–8.3)

## 2017-04-01 LAB — HEMOGLOBIN A1C: Hgb A1c MFr Bld: 5.1 % (ref 4.6–6.5)

## 2017-04-01 MED ORDER — AMLODIPINE BESYLATE 5 MG PO TABS: 5.0000 mg | ORAL_TABLET | Freq: Every day | ORAL | 3 refills | Status: DC

## 2017-04-01 MED ORDER — LOSARTAN POTASSIUM 50 MG PO TABS: 50.0000 mg | ORAL_TABLET | Freq: Every day | ORAL | 3 refills | Status: DC

## 2017-04-01 NOTE — Patient Instructions (Signed)
Great to see you today- please send me a message when you need [place] to refill the temazepam I will be in touch with your labs asap You got your tetanus booster today with the whooping cough added    Health Maintenance for Postmenopausal Women Menopause is a normal process in which your reproductive ability comes to an end. This process happens gradually over a span of months to years, usually between the ages of 36 and 57. Menopause is complete when you have missed 12 consecutive menstrual periods. It is important to talk with your health care provider about some of the most common conditions that affect postmenopausal women, such as heart disease, cancer, and bone loss (osteoporosis). Adopting a healthy lifestyle and getting preventive care can help to promote your health and wellness. Those actions can also lower your chances of developing some of these common conditions. What should I know about menopause? During menopause, you may experience a number of symptoms, such as:  Moderate-to-severe hot flashes.  Night sweats.  Decrease in sex drive.  Mood swings.  Headaches.  Tiredness.  Irritability.  Memory problems.  Insomnia.  Choosing to treat or not to treat menopausal changes is an individual decision that you make with your health care provider. What should I know about hormone replacement therapy and supplements? Hormone therapy products are effective for treating symptoms that are associated with menopause, such as hot flashes and night sweats. Hormone replacement carries certain risks, especially as you become older. If you are thinking about using estrogen or estrogen with progestin treatments, discuss the benefits and risks with your health care provider. What should I know about heart disease and stroke? Heart disease, heart attack, and stroke become more likely as you age. This may be due, in part, to the hormonal changes that your body experiences during menopause. These  can affect how your body processes dietary fats, triglycerides, and cholesterol. Heart attack and stroke are both medical emergencies. There are many things that you can do to help prevent heart disease and stroke:  Have your blood pressure checked at least every 1-2 years. High blood pressure causes heart disease and increases the risk of stroke.  If you are 62-72 years old, ask your health care provider if you should take aspirin to prevent a heart attack or a stroke.  Do not use any tobacco products, including cigarettes, chewing tobacco, or electronic cigarettes. If you need help quitting, ask your health care provider.  It is important to eat a healthy diet and maintain a healthy weight. ? Be sure to include plenty of vegetables, fruits, low-fat dairy products, and lean protein. ? Avoid eating foods that are high in solid fats, added sugars, or salt (sodium).  Get regular exercise. This is one of the most important things that you can do for your health. ? Try to exercise for at least 150 minutes each week. The type of exercise that you do should increase your heart rate and make you sweat. This is known as moderate-intensity exercise. ? Try to do strengthening exercises at least twice each week. Do these in addition to the moderate-intensity exercise.  Know your numbers.Ask your health care provider to check your cholesterol and your blood glucose. Continue to have your blood tested as directed by your health care provider.  What should I know about cancer screening? There are several types of cancer. Take the following steps to reduce your risk and to catch any cancer development as early as possible. Breast Cancer  Practice breast self-awareness. ? This means understanding how your breasts normally appear and feel. ? It also means doing regular breast self-exams. Let your health care provider know about any changes, no matter how small.  If you are 68 or older, have a clinician do  a breast exam (clinical breast exam or CBE) every year. Depending on your age, family history, and medical history, it may be recommended that you also have a yearly breast X-ray (mammogram).  If you have a family history of breast cancer, talk with your health care provider about genetic screening.  If you are at high risk for breast cancer, talk with your health care provider about having an MRI and a mammogram every year.  Breast cancer (BRCA) gene test is recommended for women who have family members with BRCA-related cancers. Results of the assessment will determine the need for genetic counseling and BRCA1 and for BRCA2 testing. BRCA-related cancers include these types: ? Breast. This occurs in males or females. ? Ovarian. ? Tubal. This may also be called fallopian tube cancer. ? Cancer of the abdominal or pelvic lining (peritoneal cancer). ? Prostate. ? Pancreatic.  Cervical, Uterine, and Ovarian Cancer Your health care provider may recommend that you be screened regularly for cancer of the pelvic organs. These include your ovaries, uterus, and vagina. This screening involves a pelvic exam, which includes checking for microscopic changes to the surface of your cervix (Pap test).  For women ages 21-65, health care providers may recommend a pelvic exam and a Pap test every three years. For women ages 31-65, they may recommend the Pap test and pelvic exam, combined with testing for human papilloma virus (HPV), every five years. Some types of HPV increase your risk of cervical cancer. Testing for HPV may also be done on women of any age who have unclear Pap test results.  Other health care providers may not recommend any screening for nonpregnant women who are considered low risk for pelvic cancer and have no symptoms. Ask your health care provider if a screening pelvic exam is right for you.  If you have had past treatment for cervical cancer or a condition that could lead to cancer, you  need Pap tests and screening for cancer for at least 20 years after your treatment. If Pap tests have been discontinued for you, your risk factors (such as having a new sexual partner) need to be reassessed to determine if you should start having screenings again. Some women have medical problems that increase the chance of getting cervical cancer. In these cases, your health care provider may recommend that you have screening and Pap tests more often.  If you have a family history of uterine cancer or ovarian cancer, talk with your health care provider about genetic screening.  If you have vaginal bleeding after reaching menopause, tell your health care provider.  There are currently no reliable tests available to screen for ovarian cancer.  Lung Cancer Lung cancer screening is recommended for adults 38-81 years old who are at high risk for lung cancer because of a history of smoking. A yearly low-dose CT scan of the lungs is recommended if you:  Currently smoke.  Have a history of at least 30 pack-years of smoking and you currently smoke or have quit within the past 15 years. A pack-year is smoking an average of one pack of cigarettes per day for one year.  Yearly screening should:  Continue until it has been 15 years since you quit.  Stop if you develop a health problem that would prevent you from having lung cancer treatment.  Colorectal Cancer  This type of cancer can be detected and can often be prevented.  Routine colorectal cancer screening usually begins at age 67 and continues through age 84.  If you have risk factors for colon cancer, your health care provider may recommend that you be screened at an earlier age.  If you have a family history of colorectal cancer, talk with your health care provider about genetic screening.  Your health care provider may also recommend using home test kits to check for hidden blood in your stool.  A small camera at the end of a tube can be  used to examine your colon directly (sigmoidoscopy or colonoscopy). This is done to check for the earliest forms of colorectal cancer.  Direct examination of the colon should be repeated every 5-10 years until age 33. However, if early forms of precancerous polyps or small growths are found or if you have a family history or genetic risk for colorectal cancer, you may need to be screened more often.  Skin Cancer  Check your skin from head to toe regularly.  Monitor any moles. Be sure to tell your health care provider: ? About any new moles or changes in moles, especially if there is a change in a mole's shape or color. ? If you have a mole that is larger than the size of a pencil eraser.  If any of your family members has a history of skin cancer, especially at a young age, talk with your health care provider about genetic screening.  Always use sunscreen. Apply sunscreen liberally and repeatedly throughout the day.  Whenever you are outside, protect yourself by wearing long sleeves, pants, a wide-brimmed hat, and sunglasses.  What should I know about osteoporosis? Osteoporosis is a condition in which bone destruction happens more quickly than new bone creation. After menopause, you may be at an increased risk for osteoporosis. To help prevent osteoporosis or the bone fractures that can happen because of osteoporosis, the following is recommended:  If you are 59-29 years old, get at least 1,000 mg of calcium and at least 600 mg of vitamin D per day.  If you are older than age 33 but younger than age 26, get at least 1,200 mg of calcium and at least 600 mg of vitamin D per day.  If you are older than age 53, get at least 1,200 mg of calcium and at least 800 mg of vitamin D per day.  Smoking and excessive alcohol intake increase the risk of osteoporosis. Eat foods that are rich in calcium and vitamin D, and do weight-bearing exercises several times each week as directed by your health care  provider. What should I know about how menopause affects my mental health? Depression may occur at any age, but it is more common as you become older. Common symptoms of depression include:  Low or sad mood.  Changes in sleep patterns.  Changes in appetite or eating patterns.  Feeling an overall lack of motivation or enjoyment of activities that you previously enjoyed.  Frequent crying spells.  Talk with your health care provider if you think that you are experiencing depression. What should I know about immunizations? It is important that you get and maintain your immunizations. These include:  Tetanus, diphtheria, and pertussis (Tdap) booster vaccine.  Influenza every year before the flu season begins.  Pneumonia vaccine.  Shingles vaccine.  Your  health care provider may also recommend other immunizations. This information is not intended to replace advice given to you by your health care provider. Make sure you discuss any questions you have with your health care provider. Document Released: 03/06/2005 Document Revised: 08/02/2015 Document Reviewed: 10/16/2014 Elsevier Interactive Patient Education  2018 Elsevier Inc.  

## 2017-04-02 ENCOUNTER — Telehealth: Payer: Self-pay | Admitting: Emergency Medicine

## 2017-04-02 ENCOUNTER — Encounter: Payer: Self-pay | Admitting: Family Medicine

## 2017-04-02 DIAGNOSIS — E785 Hyperlipidemia, unspecified: Secondary | ICD-10-CM

## 2017-04-02 NOTE — Telephone Encounter (Signed)
Which drug?  I sent her rx to [facility] yesterday

## 2017-04-02 NOTE — Telephone Encounter (Signed)
Received fax from [place] that states " Drug on back order, can't get in diff manufacture. Can we switch to 100 mg or 25 mg?   Please advise.

## 2017-04-02 NOTE — Telephone Encounter (Signed)
Losartan is on backorder.

## 2017-04-03 ENCOUNTER — Encounter: Payer: Self-pay | Admitting: Family Medicine

## 2017-04-05 MED ORDER — PRAVASTATIN SODIUM 20 MG PO TABS: 20.0000 mg | ORAL_TABLET | Freq: Every day | ORAL | 3 refills | Status: DC

## 2017-04-05 NOTE — Telephone Encounter (Signed)
Relation to VJ:KQAS Call back number: [phone] [facility]) Pharmacy: [facility]  [address], [place], [place] [address] [phone]   Reason for call:  Patient checking on the status of amLODipine (NORVASC) 5 MG tablet request.  Informed patient of PCP my chart message. Patient states she's completely out and wanted PCP to send in a 2 week supply to hold her over until mail order received. Patient states since 5 MG is on back order [place] suggested PCP prescribing 100 MG, please advise

## 2017-04-05 NOTE — Telephone Encounter (Signed)
Please advise 

## 2017-04-06 NOTE — Telephone Encounter (Signed)
Request previously sent to PCP- whom it out of the office today.

## 2017-04-06 NOTE — Telephone Encounter (Signed)
Pt called in to follow up on request. Pt says that she doesn't need a full Rx of medication (Losartan)  just a few to get her by until she receive her mail order.

## 2017-04-08 ENCOUNTER — Encounter: Payer: Self-pay | Admitting: Family Medicine

## 2017-04-08 ENCOUNTER — Other Ambulatory Visit: Payer: Self-pay | Admitting: Emergency Medicine

## 2017-04-22 ENCOUNTER — Encounter: Payer: Self-pay | Admitting: Family Medicine

## 2017-05-03 ENCOUNTER — Encounter: Payer: Self-pay | Admitting: Family Medicine

## 2017-05-03 DIAGNOSIS — M25519 Pain in unspecified shoulder: Principal | ICD-10-CM

## 2017-05-03 DIAGNOSIS — G8929 Other chronic pain: Secondary | ICD-10-CM

## 2017-05-11 ENCOUNTER — Ambulatory Visit (INDEPENDENT_AMBULATORY_CARE_PROVIDER_SITE_OTHER): Payer: 59 | Admitting: Orthopaedic Surgery

## 2017-05-11 ENCOUNTER — Encounter (INDEPENDENT_AMBULATORY_CARE_PROVIDER_SITE_OTHER): Payer: Self-pay | Admitting: Orthopaedic Surgery

## 2017-05-11 ENCOUNTER — Ambulatory Visit (INDEPENDENT_AMBULATORY_CARE_PROVIDER_SITE_OTHER): Payer: 59

## 2017-05-11 VITALS — BP 132/81 | HR 70 | Resp 16 | Ht 64.0 in | Wt 152.0 lb

## 2017-05-11 DIAGNOSIS — M25511 Pain in right shoulder: Secondary | ICD-10-CM

## 2017-05-11 MED ORDER — BUPIVACAINE HCL 0.5 % IJ SOLN
2.0000 mL | INTRAMUSCULAR | Status: AC | PRN
  Administered 2017-05-11: 2 mL via INTRA_ARTICULAR

## 2017-05-11 MED ORDER — METHYLPREDNISOLONE ACETATE 40 MG/ML IJ SUSP
80.0000 mg | INTRAMUSCULAR | Status: AC | PRN
  Administered 2017-05-11: 80 mg

## 2017-05-11 MED ORDER — LIDOCAINE HCL 2 % IJ SOLN
2.0000 mL | INTRAMUSCULAR | Status: AC | PRN
  Administered 2017-05-11: 2 mL

## 2017-05-11 NOTE — Progress Notes (Signed)
Office Visit Note   Patient: Leslie Crawford           Date of Birth: 24-Sep-1950           MRN: [id number] Visit Date: 05/11/2017              Requested by: Darreld Mclean, MD [address] [place], [place] [address] PCP: Darreld Mclean, MD   Assessment & Plan: Visit Diagnoses:  1. Acute pain of right shoulder     Plan: Impingement syndrome right shoulder.  Will inject the subacromial area with cortisone and monitor response.  Consider MRI scan in the near future if no significant improvement.  Certainly a possibility of small rotator cuff tear.  Follow-Up Instructions: No follow-ups on file.   Orders:  No orders of the defined types were placed in this encounter.  No orders of the defined types were placed in this encounter.     Procedures: Large Joint Inj: R subacromial bursa on 05/11/2017 2:22 PM Indications: pain and diagnostic evaluation Details: 25 G 1.5 in needle, anterolateral approach  Arthrogram: No  Medications: 2 mL lidocaine 2 %; 2 mL bupivacaine 0.5 %; 80 mg methylPREDNISolone acetate 40 MG/ML Outcome: tolerated well, no immediate complications Consent was given by the patient. Immediately prior to procedure a time out was called to verify the correct patient, procedure, equipment, support staff and site/side marked as required. Patient was prepped and draped in the usual sterile fashion.       Clinical Data: No additional findings.   Subjective: No chief complaint on file. Acute onset of right shoulder pain approximately 2 weeks ago upon waking in the morning.  No obvious injury or trauma.  Does do yoga.  Having difficulty raising her arm over her head  with pain.  Had one similar episode of discomfort several years ago that responded to exercises and cortisone injection.  No problem in the interim.  Denies any neck pain.  No numbness or tingling.  Skin changes or rashes.  No ecchymosis or erythema.  HPI  Review of  Systems   Objective: Vital Signs: There were no vitals taken for this visit.  Physical Exam  Ortho Exam awake alert and oriented x3.  Comfortable sitting.  Pleasant.  Able to place right arm overhead with a circuitous route.  Positive impingement and empty can testing.  Hips intact.  Strength appears to be intact, however ,some pain with internal/external rotation even with her arm at her side.  No pain at the acromioclavicular joint.  Skin intact.  No ecchymosis or erythema.  Positive empty can and impingement testing.  Specialty Comments:  No specialty comments available.  Imaging: No results found.   PMFS History: Patient Active Problem List   Diagnosis Date Noted  . BACK PAIN 01/06/2010  . ADENOCARCINOMA, BREAST 10/26/2008  . ANXIETY 10/26/2008  . HYPERLIPIDEMIA 07/17/2008  . GERD 07/01/2007  . OSTEOPENIA 07/01/2007  . INSOMNIA-SLEEP DISORDER-UNSPEC 07/01/2007   Past Medical History:  Diagnosis Date  . Allergy   . Anemia   . Anxiety   . Breast cancer ([place]) 2010   S/P Chemo/RadTx- Dr. Eston Esters  . Depression   . GERD (gastroesophageal reflux disease)   . Hyperlipidemia   . Osteopenia   . Osteoporosis     Family History  Problem Relation Age of Onset  . Cancer Mother        Throat cancer  . Cancer Father  Stomach cancer  . Early death Maternal Grandmother   . Pneumonia Maternal Grandmother   . Heart attack Maternal Grandfather   . Heart attack Paternal Grandfather     Past Surgical History:  Procedure Laterality Date  . ABDOMINAL HYSTERECTOMY     Total hysterectomy with BSO  . BREAST LUMPECTOMY Left 10/29/2008   Negative markers- 1 positive node  . TONSILLECTOMY AND ADENOIDECTOMY     Social History   Occupational History  . Not on file  Tobacco Use  . Smoking status: Former Research scientist (life sciences)  . Smokeless tobacco: Never Used  Substance and Sexual Activity  . Alcohol use: Yes    Alcohol/week: 3.0 oz    Types: 5 Cans of beer per week  . Drug use: No   . Sexual activity: Yes    Birth control/protection: Surgical     Garald Balding, MD   Note - This record has been created using [employer].  Chart creation errors have been sought, but may not always  have been located. Such creation errors do not reflect on  the standard of medical care.

## 2017-05-21 ENCOUNTER — Ambulatory Visit (INDEPENDENT_AMBULATORY_CARE_PROVIDER_SITE_OTHER): Payer: Managed Care, Other (non HMO) | Admitting: Orthopaedic Surgery

## 2017-05-26 ENCOUNTER — Telehealth (INDEPENDENT_AMBULATORY_CARE_PROVIDER_SITE_OTHER): Payer: Self-pay | Admitting: Orthopaedic Surgery

## 2017-05-26 NOTE — Telephone Encounter (Signed)
Patient states injection in shoulder helped for about one day. Patient would like to know if there is any other conservative treatment recommended that she can do, or does doctor feel MRI is only next step. Please call patient to advise.

## 2017-05-27 NOTE — Telephone Encounter (Signed)
Please advise 

## 2017-05-28 ENCOUNTER — Other Ambulatory Visit (INDEPENDENT_AMBULATORY_CARE_PROVIDER_SITE_OTHER): Payer: Self-pay | Admitting: Radiology

## 2017-05-28 DIAGNOSIS — M25511 Pain in right shoulder: Secondary | ICD-10-CM

## 2017-05-28 NOTE — Telephone Encounter (Signed)
Spoke with patient and patient agreed to try PT.  [facility].

## 2017-05-28 NOTE — Telephone Encounter (Signed)
Can try PT-please call pt and ask if she would like to proceed

## 2017-05-29 ENCOUNTER — Other Ambulatory Visit: Payer: Self-pay | Admitting: Family Medicine

## 2017-05-29 DIAGNOSIS — E785 Hyperlipidemia, unspecified: Secondary | ICD-10-CM

## 2017-06-08 ENCOUNTER — Encounter: Payer: Self-pay | Admitting: Physical Therapy

## 2017-06-08 ENCOUNTER — Ambulatory Visit: Payer: 59 | Attending: Orthopaedic Surgery | Admitting: Physical Therapy

## 2017-06-08 DIAGNOSIS — M25511 Pain in right shoulder: Secondary | ICD-10-CM

## 2017-06-08 DIAGNOSIS — M62838 Other muscle spasm: Secondary | ICD-10-CM | POA: Insufficient documentation

## 2017-06-08 DIAGNOSIS — M25611 Stiffness of right shoulder, not elsewhere classified: Secondary | ICD-10-CM | POA: Insufficient documentation

## 2017-06-08 NOTE — Patient Instructions (Signed)
Access Code: [email or web address]  URL: [email or web address]  Date: 06/08/2017  Prepared by: Lum Babe   Exercises  Standing Shoulder External Rotation with Resistance - 10 reps - 3 sets - 2 hold - 1x daily - 7x weekly  Shoulder Extension with Resistance - Neutral - 10 reps - 3 sets - 2 hold - 1x daily - 7x weekly  Standing Shoulder Row with Anchored Resistance - 10 reps - 3 sets - 2 hold - 1x daily - 7x weekly

## 2017-06-08 NOTE — Therapy (Signed)
[place], [place], [address] Phone: [phone]   Fax:  [phone]  Physical Therapy Evaluation  Patient Details  Name: Leslie Crawford MRN: [id number] Date of Birth: May 07, 1950 Referring Provider: Durward Fortes   Encounter Date: 06/08/2017  PT End of Session - 06/08/17 1612    Visit Number  1    Date for PT Re-Evaluation  08/08/17    PT Start Time  4967    PT Stop Time  1615    PT Time Calculation (min)  45 min    Activity Tolerance  Patient limited by pain    Behavior During Therapy  [facility] for tasks assessed/performed       Past Medical History:  Diagnosis Date  . Allergy   . Anemia   . Anxiety   . Breast cancer ([place]) 2010   S/P Chemo/RadTx- Dr. Eston Esters  . Depression   . GERD (gastroesophageal reflux disease)   . Hyperlipidemia   . Osteopenia   . Osteoporosis     Past Surgical History:  Procedure Laterality Date  . ABDOMINAL HYSTERECTOMY     Total hysterectomy with BSO  . BREAST LUMPECTOMY Left 10/29/2008   Negative markers- 1 positive node  . TONSILLECTOMY AND ADENOIDECTOMY      There were no vitals filed for this visit.   Subjective Assessment - 06/08/17 1522    Subjective  Patient reports that she has had some pain in the right shoulder over the past 3 years, she reports that she always got better with cortisone injection and some PT.  She had a cortisone injection recently wihtout any results.  She is unsure of any specific cause    Limitations  Reading;Lifting;House hold activities    Patient Stated Goals  have less pain    Currently in Pain?  Yes    Pain Score  2     Pain Location  Shoulder    Pain Orientation  Right;Anterior;Lateral;Upper    Pain Descriptors / Indicators  Aching;Sore    Pain Type  Chronic pain    Pain Onset  More than a month ago    Pain Frequency  Intermittent    Aggravating Factors   reaching up, reaching across body, reaching out pain up to  8/10    Pain Relieving Factors  no moving, some OTC pain meds pain can be 0/10 at rest    Effect of Pain on Daily Activities  difficulty dressing, doing hair         [facility] PT Assessment - 06/08/17 0001      Assessment   Medical Diagnosis  right shoulder pain    Referring Provider  [place]    Onset Date/Surgical Date  05/09/17    Hand Dominance  Right    Prior Therapy  a year ago for the same      Precautions   Precautions  None      Balance Screen   Has the patient fallen in the past 6 months  No    Has the patient had a decrease in activity level because of a fear of falling?   No    Is the patient reluctant to leave their home because of a fear of falling?   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    Leisure  walks has done Yoga, has not done yoga due to the shoulder pain in the past 3 months      Posture/Postural Control   Posture Comments  rounded shoulders      ROM / Strength   AROM / PROM / Strength  AROM;PROM;Strength      AROM   AROM Assessment Site  Shoulder    Right/Left Shoulder  Right    Right Shoulder Flexion  100 Degrees pain starting at 80 degrees    Right Shoulder ABduction  120 Degrees    Right Shoulder Internal Rotation  80 Degrees    Right Shoulder External Rotation  80 Degrees      PROM   PROM Assessment Site  Shoulder    Right/Left Shoulder  Right    Right Shoulder Flexion  170 Degrees    Right Shoulder ABduction  170 Degrees    Right Shoulder Internal Rotation  90 Degrees    Right Shoulder External Rotation  90 Degrees      Strength   Overall Strength Comments  elbow WFL's, right shoulder ER4-/5 with pain, IR 4+/5, flexion and abduction 4-/5 with pain      Palpation   Palpation comment  she is tender and tight in the uppper trap and the lateral  deltoid and upper arm      Special Tests   Other special tests  pain ful impingement, empty can test she had strength and was very painful                Objective measurements completed on examination: See above findings.      [facility] Adult PT Treatment/Exercise - 06/08/17 0001      Modalities   Modalities  Iontophoresis      Iontophoresis   Type of Iontophoresis  Dexamethasone    Location  right anterior lateral shoulder    Dose  54mA    Time  4 hour patch               PT Short Term Goals - 06/08/17 1622      PT SHORT TERM GOAL #1   Title  independent iwth initial HEP    Time  2    Period  Weeks    Status  New        PT Long Term Goals - 06/08/17 1622      PT LONG TERM GOAL #1   Title  decrease pain 50%    Time  8    Period  Weeks    Status  New      PT LONG TERM GOAL #2   Title  increase right shoulder ER strength to 4/5    Time  8    Period  Weeks    Status  New      PT LONG TERM GOAL #3   Title  report no pain with using the mouse    Time  8    Period  Weeks    Status  New      PT LONG TERM GOAL #4   Title  understand proper posture and ergonomics     Time  8    Period  Weeks    Status  New             Plan - 06/08/17 1616    Clinical Impression Statement  Patient reports  that she has had some shoulder pain off and on for 3 years, she reports that she has had cortisone injections that in the past helped, she had a recent injection without any results.  She reports that she has had pain for about 3 months now.  X-ray is negative except for some DJD.  She reports pain and difficulty reaching across and out.  She has normal ROM reaching behind her back.  She had painful AROM that was limited, PROM was painfree and WNL's.  She had some pain with impingement test, very painful with empty can test but had some strength, ER strength was 4-/5 due to pain, flexion and abduction was 3/5 due to pain    Clinical Presentation  Unstable     Clinical Decision Making  Low    Rehab Potential  Good    PT Frequency  2x / week    PT Duration  8 weeks    PT Treatment/Interventions  ADLs/Self Care Home Management;Cryotherapy;Electrical Stimulation;Iontophoresis 4mg /ml Dexamethasone;Moist Heat;Ultrasound;Therapeutic exercise;Therapeutic activities;Neuromuscular re-education;Manual techniques;Taping;Patient/family education;Vasopneumatic Device    PT Next Visit Plan  gave HEP for scapular stabilization and tried ionto, try to decrease stress on the RC tendons with posture and body mechanics and scapular stab, ionto    Consulted and Agree with Plan of Care  Patient       Patient will benefit from skilled therapeutic intervention in order to improve the following deficits and impairments:  Decreased range of motion, Impaired UE functional use, Increased muscle spasms, Pain, Improper body mechanics, Postural dysfunction, Decreased strength  Visit Diagnosis: Acute pain of right shoulder - Plan: PT plan of care cert/re-cert  Stiffness of right shoulder, not elsewhere classified - Plan: PT plan of care cert/re-cert  Other muscle spasm - Plan: PT plan of care cert/re-cert     Problem List Patient Active Problem List   Diagnosis Date Noted  . BACK PAIN 01/06/2010  . ADENOCARCINOMA, BREAST 10/26/2008  . ANXIETY 10/26/2008  . HYPERLIPIDEMIA 07/17/2008  . GERD 07/01/2007  . OSTEOPENIA 07/01/2007  . INSOMNIA-SLEEP DISORDER-UNSPEC 07/01/2007    Sumner Boast., PT 06/08/2017, 4:25 PM  [place] [address] [address] [place], [place], [address] Phone: [phone]   Fax:  [phone]  Name: Leslie Crawford MRN: [id number] Date of Birth: 06/04/1950

## 2017-06-14 ENCOUNTER — Ambulatory Visit: Payer: 59 | Admitting: Physical Therapy

## 2017-06-14 DIAGNOSIS — M25511 Pain in right shoulder: Secondary | ICD-10-CM | POA: Diagnosis not present

## 2017-06-14 DIAGNOSIS — M25611 Stiffness of right shoulder, not elsewhere classified: Secondary | ICD-10-CM

## 2017-06-14 NOTE — Patient Instructions (Signed)
  External Rotation (Isometric)  Place back of left fist against door frame, with elbow bent. Press fist against door frame. Hold __10__ seconds. Repeat _5___ times. Do ___2_ sessions per day.  ROM: Flexion (Alternate)    Slide right arm up wall, with palm out, by leaning toward wall. Hold _2___ seconds. Repeat __10__ times per set. Do ____ sets per session. Do __1__ sessions per day.  http://orth.exer.us/759   Copyright  VHI. All rights reserved.   Madelyn Flavors, PT

## 2017-06-14 NOTE — Therapy (Signed)
[place], [place], [address] Phone: [phone]   Fax:  [phone]  Physical Therapy Treatment  Patient Details  Name: Leslie Crawford MRN: [id number] Date of Birth: December 09, 1950 Referring Provider: Durward Fortes   Encounter Date: 06/14/2017  PT End of Session - 06/14/17 0932    Visit Number  2    Date for PT Re-Evaluation  08/08/17    PT Start Time  0928    PT Stop Time  1006    PT Time Calculation (min)  38 min       Past Medical History:  Diagnosis Date  . Allergy   . Anemia   . Anxiety   . Breast cancer ([place]) 2010   S/P Chemo/RadTx- Dr. Eston Esters  . Depression   . GERD (gastroesophageal reflux disease)   . Hyperlipidemia   . Osteopenia   . Osteoporosis     Past Surgical History:  Procedure Laterality Date  . ABDOMINAL HYSTERECTOMY     Total hysterectomy with BSO  . BREAST LUMPECTOMY Left 10/29/2008   Negative markers- 1 positive node  . TONSILLECTOMY AND ADENOIDECTOMY      There were no vitals filed for this visit.  Subjective Assessment - 06/14/17 0931    Subjective  Patient states her shoulde mainly hurts when she is reaching forward or overhead.    Patient Stated Goals  have less pain    Currently in Pain?  Yes    Pain Score  2     Pain Location  Shoulder    Pain Orientation  Right    Pain Descriptors / Indicators  Aching;Sore                       [facility] Adult PT Treatment/Exercise - 06/14/17 0001      Exercises   Exercises  Shoulder      Shoulder Exercises: Prone   Extension  Strengthening;Right;20 reps;Weights    Extension Weight (lbs)  1    Extension Limitations  2# too much    Horizontal ABduction 1  Strengthening;Right;20 reps palm down    Horizontal ABduction 2  Strengthening;Right;20 reps Y palm down      Shoulder Exercises: Sidelying   External Rotation  Strengthening;Right;Weights;10 reps    External Rotation Weight (lbs)  1    External Rotation Limitations  increased pain after one set    Flexion  Strengthening;Right;Limitations;10 reps    Flexion Limitations  pain free range      Shoulder Exercises: Standing   Horizontal ABduction  Strengthening;Theraband;10 reps    Theraband Level (Shoulder Horizontal ABduction)  Level 1 (Yellow)    External Rotation  AROM;Strengthening;Right;20 reps;Theraband 10 reps no band; 10 with yellow    Theraband Level (Shoulder External Rotation)  Level 1 (Yellow)    External Rotation Limitations  painful when down Bil; then single arm up to pain    Internal Rotation  Strengthening;20 reps;Theraband    Theraband Level (Shoulder Internal Rotation)  Level 1 (Yellow)    Flexion  AAROM;Right;10 reps    Extension  Strengthening;Both;20 reps;Theraband    Theraband Level (Shoulder Extension)  Level 2 (Red)    Row  Strengthening;20 reps;Theraband    Theraband Level (Shoulder Row)  Level 2 (Red)      Shoulder Exercises: Isometric Strengthening   External Rotation  5X10"      Modalities   Modalities  Iontophoresis      Iontophoresis  Type of Iontophoresis  Dexamethasone    Location  Right ant/lat shoulder    Dose  89m    Time  4 hour patch               PT Short Term Goals - 06/08/17 1622      PT SHORT TERM GOAL #1   Title  independent iwth initial HEP    Time  2    Period  Weeks    Status  New        PT Long Term Goals - 06/08/17 1622      PT LONG TERM GOAL #1   Title  decrease pain 50%    Time  8    Period  Weeks    Status  New      PT LONG TERM GOAL #2   Title  increase right shoulder ER strength to 4/5    Time  8    Period  Weeks    Status  New      PT LONG TERM GOAL #3   Title  report no pain with using the mouse    Time  8    Period  Weeks    Status  New      PT LONG TERM GOAL #4   Title  understand proper posture and ergonomics     Time  8    Period  Weeks    Status  New            Plan - 06/14/17 1011    Clinical Impression  Statement  Patient did fairly well with TE today. She had pain with resisted ER so isometric was given for HEP. No goals met as only second visit.    Rehab Potential  Good    PT Frequency  2x / week    PT Duration  8 weeks    PT Treatment/Interventions  ADLs/Self Care Home Management;Cryotherapy;Electrical Stimulation;Iontophoresis 45mml Dexamethasone;Moist Heat;Ultrasound;Therapeutic exercise;Therapeutic activities;Neuromuscular re-education;Manual techniques;Taping;Patient/family education;Vasopneumatic Device    PT Next Visit Plan  try to decrease stress on the RC tendons with posture and body mechanics and scapular stab, ionto    PT Home Exercise Plan  wall slide for flexion, ER isometric, SDLY flex pain free; rows/ext band       Patient will benefit from skilled therapeutic intervention in order to improve the following deficits and impairments:  Decreased range of motion, Impaired UE functional use, Increased muscle spasms, Pain, Improper body mechanics, Postural dysfunction, Decreased strength  Visit Diagnosis: Stiffness of right shoulder, not elsewhere classified  Acute pain of right shoulder     Problem List Patient Active Problem List   Diagnosis Date Noted  . BACK PAIN 01/06/2010  . ADENOCARCINOMA, BREAST 10/26/2008  . ANXIETY 10/26/2008  . HYPERLIPIDEMIA 07/17/2008  . GERD 07/01/2007  . OSTEOPENIA 07/01/2007  . INSOMNIA-SLEEP DISORDER-UNSPEC 07/01/2007    Rabon Scholle PT 06/14/2017, 10:13 AM  [place]one: [phone] Fax:  [phone]: Leslie FRANKOWSKIRN: [id number] of Birth: 7/April 03, 1950

## 2017-06-15 ENCOUNTER — Ambulatory Visit: Payer: 59 | Admitting: Physical Therapy

## 2017-06-17 ENCOUNTER — Ambulatory Visit: Payer: 59 | Admitting: Physical Therapy

## 2017-06-17 DIAGNOSIS — M62838 Other muscle spasm: Secondary | ICD-10-CM

## 2017-06-17 DIAGNOSIS — M25511 Pain in right shoulder: Secondary | ICD-10-CM | POA: Diagnosis not present

## 2017-06-17 DIAGNOSIS — M25611 Stiffness of right shoulder, not elsewhere classified: Secondary | ICD-10-CM

## 2017-06-17 NOTE — Therapy (Signed)
[place], [place], [address] Phone: [phone]   Fax:  [phone]  Physical Therapy Treatment  Patient Details  Name: Leslie Crawford MRN: [id number] Date of Birth: 11-Nov-1950 Referring Provider: Durward Fortes   Encounter Date: 06/17/2017  PT End of Session - 06/17/17 1729    Visit Number  3    Date for PT Re-Evaluation  08/08/17    PT Start Time  0175    PT Stop Time  1735    PT Time Calculation (min)  45 min       Past Medical History:  Diagnosis Date  . Allergy   . Anemia   . Anxiety   . Breast cancer ([place]) 2010   S/P Chemo/RadTx- Dr. Eston Esters  . Depression   . GERD (gastroesophageal reflux disease)   . Hyperlipidemia   . Osteopenia   . Osteoporosis     Past Surgical History:  Procedure Laterality Date  . ABDOMINAL HYSTERECTOMY     Total hysterectomy with BSO  . BREAST LUMPECTOMY Left 10/29/2008   Negative markers- 1 positive node  . TONSILLECTOMY AND ADENOIDECTOMY      There were no vitals filed for this visit.  Subjective Assessment - 06/17/17 1652    Subjective  I notice some improvements. ex are going okay home. pain only with certain mvmt    Currently in Pain?  No/denies         [facility] PT Assessment - 06/17/17 0001      AROM   AROM Assessment Site  Shoulder    Right/Left Shoulder  Right    Right Shoulder Flexion  170 Degrees ecc catch    Right Shoulder ABduction  175 Degrees    Right Shoulder Internal Rotation  85 Degrees    Right Shoulder External Rotation  90 Degrees                   [facility] Adult PT Treatment/Exercise - 06/17/17 0001      Self-Care   Self-Care  Posture for desk job      Shoulder Exercises: Seated   Other Seated Exercises  shld ext,row and horz abd 2# 2 sets 10      Shoulder Exercises: Standing   Other Standing Exercises  empty can 10 times- painful PNF 2 sets 10 2#- painful but tolerated    Other Standing Exercises   ball vs wall CC and CCW 5 each      Shoulder Exercises: Pulleys   Other Pulley Exercises  cable pulley ext and abd 5# 2 sets 10      Shoulder Exercises: ROM/Strengthening   UBE (Upper Arm Bike)  L 4 2 fwd/2 back    Cybex Press  20 reps with serratus 10#     Cybex Row  20 reps 15#      Modalities   Modalities  Iontophoresis      Iontophoresis   Type of Iontophoresis  Dexamethasone    Location  Right ant/lat shoulder    Dose  48m    Time  4 hour patch      Manual Therapy   Manual Therapy  Joint mobilization;Soft tissue mobilization;Passive ROM    Manual therapy comments  pain full with ER and flex end range improved  after jt capsule stretches    Joint Mobilization  jt capsule stretch    Soft tissue mobilization  upper arm/delt    Passive ROM  all directions- limited  end range ER and flex               PT Short Term Goals - 06/17/17 1728      PT SHORT TERM GOAL #1   Title  independent iwth initial HEP    Status  Achieved        PT Long Term Goals - 06/17/17 1728      PT LONG TERM GOAL #1   Title  decrease pain 50%    Status  On-going      PT LONG TERM GOAL #2   Title  increase right shoulder ER strength to 4/5    Status  On-going      PT LONG TERM GOAL #3   Title  report no pain with using the mouse      PT LONG TERM GOAL #4   Title  understand proper posture and ergonomics     Status  On-going            Plan - 06/17/17 1730    Clinical Impression Statement  STG of HEP met. Ongoing LTG. AROM has significantly increased. Pt with end range tightness and pain flex and ER but responded well to jt mobs and capsule stretch. Discussed desk posture.    PT Treatment/Interventions  ADLs/Self Care Home Management;Cryotherapy;Electrical Stimulation;Iontophoresis 65m/ml Dexamethasone;Moist Heat;Ultrasound;Therapeutic exercise;Therapeutic activities;Neuromuscular re-education;Manual techniques;Taping;Patient/family education;Vasopneumatic Device    PT Next  Visit Plan  try to decrease stress on the RC tendons with posture and body mechanics and scapular stab, ionto       Patient will benefit from skilled therapeutic intervention in order to improve the following deficits and impairments:  Decreased range of motion, Impaired UE functional use, Increased muscle spasms, Pain, Improper body mechanics, Postural dysfunction, Decreased strength  Visit Diagnosis: Stiffness of right shoulder, not elsewhere classified  Acute pain of right shoulder  Other muscle spasm     Problem List Patient Active Problem List   Diagnosis Date Noted  . BACK PAIN 01/06/2010  . ADENOCARCINOMA, BREAST 10/26/2008  . ANXIETY 10/26/2008  . HYPERLIPIDEMIA 07/17/2008  . GERD 07/01/2007  . OSTEOPENIA 07/01/2007  . INSOMNIA-SLEEP DISORDER-UNSPEC 07/01/2007    PAYSEUR,ANGIE PTA 06/17/2017, 5:32 PM  [address] [address]Phone: [phone]  Fax:  [phone] Name: Leslie ALLBRITTONMRN: [id number] of Birth: 703-16-1952

## 2017-06-18 ENCOUNTER — Encounter: Payer: Self-pay | Admitting: Gastroenterology

## 2017-06-22 ENCOUNTER — Ambulatory Visit: Payer: 59 | Admitting: Physical Therapy

## 2017-06-22 ENCOUNTER — Encounter: Payer: Self-pay | Admitting: Physical Therapy

## 2017-06-22 DIAGNOSIS — M25511 Pain in right shoulder: Secondary | ICD-10-CM | POA: Diagnosis not present

## 2017-06-22 DIAGNOSIS — M62838 Other muscle spasm: Secondary | ICD-10-CM

## 2017-06-22 DIAGNOSIS — M25611 Stiffness of right shoulder, not elsewhere classified: Secondary | ICD-10-CM

## 2017-06-22 NOTE — Therapy (Signed)
[place], [place], [address] Phone: [phone]   Fax:  [phone]  Physical Therapy Treatment  Patient Details  Name: Leslie Crawford MRN: [id number] Date of Birth: [birth year] Referring Provider: Durward Fortes   Encounter Date: 06/22/2017  PT End of Session - 06/22/17 1728    Visit Number  4    Date for PT Re-Evaluation  08/08/17    PT Start Time  0623    PT Stop Time  1745    PT Time Calculation (min)  50 min    Activity Tolerance  Patient tolerated treatment well    Behavior During Therapy  [facility] for tasks assessed/performed       Past Medical History:  Diagnosis Date  . Allergy   . Anemia   . Anxiety   . Breast cancer ([place]) 2010   S/P Chemo/RadTx- Dr. Eston Esters  . Depression   . GERD (gastroesophageal reflux disease)   . Hyperlipidemia   . Osteopenia   . Osteoporosis     Past Surgical History:  Procedure Laterality Date  . ABDOMINAL HYSTERECTOMY     Total hysterectomy with BSO  . BREAST LUMPECTOMY Left 10/29/2008   Negative markers- 1 positive node  . TONSILLECTOMY AND ADENOIDECTOMY      There were no vitals filed for this visit.  Subjective Assessment - 06/22/17 1724    Subjective  Patient reports that she worked over the weekend, she reports that she was doing a job that is not what she normally does, reports that it was stressful and she is hurting more in the neck, upper traps and the shoulder    Currently in Pain?  Yes    Pain Score  6     Pain Location  Shoulder upper traps and neck    Pain Orientation  Right    Aggravating Factors   stress, reaching                       [facility] Adult PT Treatment/Exercise - 06/22/17 0001      Modalities   Modalities  Ultrasound;Electrical Stimulation;Moist Heat      Moist Heat Therapy   Number Minutes Moist Heat  15 Minutes    Moist Heat Location  Shoulder      Electrical Stimulation   Electrical Stimulation  Location  right shoulder and upper trap area    Electrical Stimulation Action  IFC    Electrical Stimulation Parameters  supine    Electrical Stimulation Goals  Pain      Ultrasound   Ultrasound Location  right lateral and anterior shoulder    Ultrasound Parameters  100% 1.4 w/cm2    Ultrasound Goals  Pain      Iontophoresis   Type of Iontophoresis  Dexamethasone    Location  Right ant/lat shoulder    Dose  71m    Time  4 hour patch, #4      Manual Therapy   Manual Therapy  Joint mobilization;Soft tissue mobilization;Passive ROM    Joint Mobilization  jt capsule stretch    Soft tissue mobilization  upper arm/delt, upper trap and cervical area    Passive ROM  all directions- limited end range ER and flex               PT Short Term Goals - 06/17/17 1728      PT SHORT TERM GOAL #1   Title  independent iwth initial  HEP    Status  Achieved        PT Long Term Goals - 06/22/17 1730      PT LONG TERM GOAL #3   Title  report no pain with using the mouse    Status  On-going      PT LONG TERM GOAL #4   Title  understand proper posture and ergonomics     Status  Partially Met            Plan - 06/22/17 1728    Clinical Impression Statement  Patient with spasms and increased pain in the shoulder and neck area, reports stress of working yesterday.  Her ROM remains much improved.  There are knots in the right upper trap and into the cervical paraspinals    PT Next Visit Plan  see if todays treatment helped, try to continue with stabilization    Consulted and Agree with Plan of Care  Patient       Patient will benefit from skilled therapeutic intervention in order to improve the following deficits and impairments:  Decreased range of motion, Impaired UE functional use, Increased muscle spasms, Pain, Improper body mechanics, Postural dysfunction, Decreased strength  Visit Diagnosis: Stiffness of right shoulder, not elsewhere classified  Acute pain of right  shoulder  Other muscle spasm     Problem List Patient Active Problem List   Diagnosis Date Noted  . BACK PAIN 01/06/2010  . ADENOCARCINOMA, BREAST 10/26/2008  . ANXIETY 10/26/2008  . HYPERLIPIDEMIA 07/17/2008  . GERD 07/01/2007  . OSTEOPENIA 07/01/2007  . INSOMNIA-SLEEP DISORDER-UNSPEC 07/01/2007    Sumner Boast., PT 06/22/2017, 5:32 PM  [place] [address] [place], [place], [address] Phone: [phone]   Fax:  [phone]  Name: SHAWNA WEARING MRN: [id number] Date of Birth: 01/02/1951

## 2017-06-24 ENCOUNTER — Ambulatory Visit: Payer: 59 | Admitting: Physical Therapy

## 2017-06-24 DIAGNOSIS — M25511 Pain in right shoulder: Secondary | ICD-10-CM | POA: Diagnosis not present

## 2017-06-24 DIAGNOSIS — M25611 Stiffness of right shoulder, not elsewhere classified: Secondary | ICD-10-CM

## 2017-06-24 DIAGNOSIS — M62838 Other muscle spasm: Secondary | ICD-10-CM

## 2017-06-24 NOTE — Therapy (Signed)
[place], [place], [address] Phone: [phone]   Fax:  [phone]  Physical Therapy Treatment  Patient Details  Name: Leslie Crawford MRN: [id number] Date of Birth: Jun 23, 1950 Referring Provider: Durward Fortes   Encounter Date: 06/24/2017  PT End of Session - 06/24/17 1725    Visit Number  5    Date for PT Re-Evaluation  08/08/17    PT Start Time  8546    PT Stop Time  1735    PT Time Calculation (min)  45 min       Past Medical History:  Diagnosis Date  . Allergy   . Anemia   . Anxiety   . Breast cancer ([place]) 2010   S/P Chemo/RadTx- Dr. Eston Esters  . Depression   . GERD (gastroesophageal reflux disease)   . Hyperlipidemia   . Osteopenia   . Osteoporosis     Past Surgical History:  Procedure Laterality Date  . ABDOMINAL HYSTERECTOMY     Total hysterectomy with BSO  . BREAST LUMPECTOMY Left 10/29/2008   Negative markers- 1 positive node  . TONSILLECTOMY AND ADENOIDECTOMY      There were no vitals filed for this visit.  Subjective Assessment - 06/24/17 1650    Subjective  neck is some better, shld is no significant difference I think I need to call MD and get MRI    Currently in Pain?  Yes    Pain Score  3     Pain Location  Shoulder    Pain Orientation  Right         [facility] PT Assessment - 06/24/17 0001      AROM   Right Shoulder Flexion  180 Degrees catch at 90    Right Shoulder ABduction  120 Degrees    Right Shoulder Internal Rotation  85 Degrees    Right Shoulder External Rotation  90 Degrees      Strength   Overall Strength Comments  ER and abd 3+/5                   [facility] Adult PT Treatment/Exercise - 06/24/17 0001      Shoulder Exercises: Standing   Retraction  Theraband    Other Standing Exercises  PNF tband    Other Standing Exercises  ER tband      Shoulder Exercises: ROM/Strengthening   UBE (Upper Arm Bike)  L 4 3 fwd/3 back    Lat  Pull  20 reps 20#    Cybex Row  20 reps 20#      Moist Heat Therapy   Number Minutes Moist Heat  15 Minutes    Moist Heat Location  Shoulder      Electrical Stimulation   Electrical Stimulation Location  right shoulder and upper trap area    Electrical Stimulation Action  IFC    Electrical Stimulation Parameters  supine    Electrical Stimulation Goals  Pain               PT Short Term Goals - 06/17/17 1728      PT SHORT TERM GOAL #1   Title  independent iwth initial HEP    Status  Achieved        PT Long Term Goals - 06/22/17 1730      PT LONG TERM GOAL #3   Title  report no pain with using the mouse    Status  On-going  PT LONG TERM GOAL #4   Title  understand proper posture and ergonomics     Status  Partially Met            Plan - 06/24/17 1725    Clinical Impression Statement  loss of act abd by 55 degrees, painful and unable to resist empty can test or flex. multi compensations with shld mvmt d/t pain that this PTA did not observe during last week session. no goal progress this week with set back    PT Treatment/Interventions  ADLs/Self Care Home Management;Cryotherapy;Electrical Stimulation;Iontophoresis 41m/ml Dexamethasone;Moist Heat;Ultrasound;Therapeutic exercise;Therapeutic activities;Neuromuscular re-education;Manual techniques;Taping;Patient/family education;Vasopneumatic Device    PT Next Visit Plan  Possible HOLD as pt calls MD for appt/MRI       Patient will benefit from skilled therapeutic intervention in order to improve the following deficits and impairments:  Decreased range of motion, Impaired UE functional use, Increased muscle spasms, Pain, Improper body mechanics, Postural dysfunction, Decreased strength  Visit Diagnosis: Stiffness of right shoulder, not elsewhere classified  Acute pain of right shoulder  Other muscle spasm     Problem List Patient Active Problem List   Diagnosis Date Noted  . BACK PAIN 01/06/2010  .  ADENOCARCINOMA, BREAST 10/26/2008  . ANXIETY 10/26/2008  . HYPERLIPIDEMIA 07/17/2008  . GERD 07/01/2007  . OSTEOPENIA 07/01/2007  . INSOMNIA-SLEEP DISORDER-UNSPEC 07/01/2007    Shade Kaley,ANGIE PTA 06/24/2017, 5:28 PM  [address] [address]Phone: [phone]  Fax:  [phone] Name: Leslie REEDERMRN: [id number] of Birth: [birth year]

## 2017-06-25 ENCOUNTER — Telehealth (INDEPENDENT_AMBULATORY_CARE_PROVIDER_SITE_OTHER): Payer: Self-pay | Admitting: Orthopaedic Surgery

## 2017-06-25 ENCOUNTER — Other Ambulatory Visit (INDEPENDENT_AMBULATORY_CARE_PROVIDER_SITE_OTHER): Payer: Self-pay | Admitting: Radiology

## 2017-06-25 NOTE — Telephone Encounter (Signed)
Can I schedule for mri

## 2017-06-25 NOTE — Telephone Encounter (Signed)
Patient called stating that she would like to move forward with an MRI, she stated that the Cortisone injection did not help with the pain.  NG#[phone].  Thank you.

## 2017-06-28 ENCOUNTER — Other Ambulatory Visit (INDEPENDENT_AMBULATORY_CARE_PROVIDER_SITE_OTHER): Payer: Self-pay | Admitting: Radiology

## 2017-06-28 DIAGNOSIS — M25511 Pain in right shoulder: Secondary | ICD-10-CM

## 2017-06-28 NOTE — Telephone Encounter (Signed)
ok 

## 2017-06-28 NOTE — Telephone Encounter (Signed)
Sent in mri right shoulder referral

## 2017-06-29 ENCOUNTER — Ambulatory Visit: Payer: 59 | Admitting: Physical Therapy

## 2017-06-29 ENCOUNTER — Telehealth (INDEPENDENT_AMBULATORY_CARE_PROVIDER_SITE_OTHER): Payer: Self-pay | Admitting: Orthopaedic Surgery

## 2017-06-29 NOTE — Telephone Encounter (Signed)
Left message that the MRI r shoulder referral was sent in

## 2017-06-29 NOTE — Telephone Encounter (Signed)
Patient left a voicemail stating she was returning a call to the office.   °

## 2017-07-05 ENCOUNTER — Ambulatory Visit (INDEPENDENT_AMBULATORY_CARE_PROVIDER_SITE_OTHER): Payer: 59

## 2017-07-05 DIAGNOSIS — M75111 Incomplete rotator cuff tear or rupture of right shoulder, not specified as traumatic: Secondary | ICD-10-CM

## 2017-07-05 DIAGNOSIS — M25511 Pain in right shoulder: Secondary | ICD-10-CM

## 2017-07-05 DIAGNOSIS — M62121 Other rupture of muscle (nontraumatic), right upper arm: Secondary | ICD-10-CM | POA: Diagnosis not present

## 2017-07-05 DIAGNOSIS — M778 Other enthesopathies, not elsewhere classified: Secondary | ICD-10-CM | POA: Diagnosis not present

## 2017-07-12 ENCOUNTER — Encounter (INDEPENDENT_AMBULATORY_CARE_PROVIDER_SITE_OTHER): Payer: Self-pay | Admitting: Orthopaedic Surgery

## 2017-07-12 ENCOUNTER — Ambulatory Visit (INDEPENDENT_AMBULATORY_CARE_PROVIDER_SITE_OTHER): Payer: 59 | Admitting: Orthopaedic Surgery

## 2017-07-12 VITALS — BP 123/77 | HR 70 | Resp 13 | Ht 64.0 in | Wt 151.0 lb

## 2017-07-12 DIAGNOSIS — M25511 Pain in right shoulder: Secondary | ICD-10-CM | POA: Diagnosis not present

## 2017-07-12 DIAGNOSIS — G8929 Other chronic pain: Secondary | ICD-10-CM

## 2017-07-12 NOTE — Progress Notes (Signed)
Office Visit Note   Patient: Leslie Crawford           Date of Birth: 01-Jun-1950           MRN: [id number] Visit Date: 07/12/2017              Requested by: Darreld Mclean, MD [address] [place], [place] [address] PCP: Darreld Mclean, MD   Assessment & Plan: Visit Diagnoses:  1. Chronic right shoulder pain     Plan: MRI scan demonstrates tear of the supraspinatus tendon with retraction.  There are degenerative changes of the acromioclavicular joint.  There are also some mild degenerative changes in the glenohumeral joint.  Pain seems to originate more from the rotator cuff tear than from the mild osteoarthritis.  I think the best approach would be arthroscopic SCD DCR mini open rotator cuff tear repair given her poor response to time, meds injection.  Long discussion regarding the surgery, incisions and what she can expect postoperatively.  Mrs. Bachicha would like to proceed.  Follow-Up Instructions: Return will schedule surgery right shoulder.   Orders:  No orders of the defined types were placed in this encounter.  No orders of the defined types were placed in this encounter.     Procedures: No procedures performed   Clinical Data: No additional findings.   Subjective: Chief Complaint  Patient presents with  . Right Shoulder - Follow-up  . Follow-up    MRI review  No change in symptoms.  Having pain predominantly on the anterior aspect of her shoulder placing right arm at eye level or above.  No numbness or tingling  HPI  Review of Systems  Constitutional: Negative for fatigue.  HENT: Negative for tinnitus.   Eyes: Negative for pain.  Respiratory: Negative for shortness of breath.   Cardiovascular: Negative for chest pain.  Gastrointestinal: Negative for abdominal pain.  Endocrine: Negative for polyuria.  Genitourinary: Negative for pelvic pain.  Musculoskeletal: Negative for gait problem.  Skin: Negative for rash.    Allergic/Immunologic: Negative for environmental allergies.  Neurological: Negative for dizziness, weakness and headaches.  Hematological: Does not bruise/bleed easily.  Psychiatric/Behavioral: Negative for confusion.     Objective: Vital Signs: BP 123/77 (BP Location: Right Arm, Patient Position: Sitting, Cuff Size: Normal)   Pulse 70   Resp 13   Ht 5\' 4"  (1.626 m)   Wt 151 lb (68.5 kg) Comment: per patient  BMI 25.92 kg/m   Physical Exam  Constitutional: She is oriented to person, place, and time. She appears well-developed and well-nourished.  HENT:  Mouth/Throat: Oropharynx is clear and moist.  Eyes: Pupils are equal, round, and reactive to light. EOM are normal.  Pulmonary/Chest: Effort normal.  Neurological: She is alert and oriented to person, place, and time.  Skin: Skin is warm and dry.  Psychiatric: She has a normal mood and affect. Her behavior is normal.    Ortho Exam awake alert and oriented x3.  Comfortable sitting.  Positive impingement and empty can testing right shoulder.  Some tenderness along the anterior subacromial region.  None at the acromioclavicular joint.  Does have a painful arc of overhead motion.  No evidence of adhesive capsulitis or instability.  Biceps tendon was noted to be torn on MRI scan.  Biceps muscle appears to be slightly lower in position compared to the left.  Has good strength.  Neurovascular exam intact  Specialty Comments:  No specialty comments available.  Imaging: No results  found.   PMFS History: Patient Active Problem List   Diagnosis Date Noted  . BACK PAIN 01/06/2010  . ADENOCARCINOMA, BREAST 10/26/2008  . ANXIETY 10/26/2008  . HYPERLIPIDEMIA 07/17/2008  . GERD 07/01/2007  . OSTEOPENIA 07/01/2007  . INSOMNIA-SLEEP DISORDER-UNSPEC 07/01/2007   Past Medical History:  Diagnosis Date  . Allergy   . Anemia   . Anxiety   . Breast cancer ([place]) 2010   S/P Chemo/RadTx- Dr. Eston Esters  . Depression   . GERD  (gastroesophageal reflux disease)   . Hyperlipidemia   . Osteopenia   . Osteoporosis     Family History  Problem Relation Age of Onset  . Cancer Mother        Throat cancer  . Cancer Father        Stomach cancer  . Early death Maternal Grandmother   . Pneumonia Maternal Grandmother   . Heart attack Maternal Grandfather   . Heart attack Paternal Grandfather     Past Surgical History:  Procedure Laterality Date  . ABDOMINAL HYSTERECTOMY     Total hysterectomy with BSO  . BREAST LUMPECTOMY Left 10/29/2008   Negative markers- 1 positive node  . TONSILLECTOMY AND ADENOIDECTOMY     Social History   Occupational History  . Not on file  Tobacco Use  . Smoking status: Former Research scientist (life sciences)  . Smokeless tobacco: Never Used  Substance and Sexual Activity  . Alcohol use: Yes    Alcohol/week: 3.0 oz    Types: 5 Cans of beer per week  . Drug use: No  . Sexual activity: Yes    Birth control/protection: Surgical

## 2017-08-03 ENCOUNTER — Telehealth: Payer: Self-pay | Admitting: Family Medicine

## 2017-08-03 NOTE — Telephone Encounter (Signed)
Copied from [place] [id number]. Topic: Quick Communication - See Telephone Encounter >> Aug 03, 2017 12:07 PM Conception Chancy, NT wrote: CRM for notification. See Telephone encounter for: 08/03/17.  Tye Maryland is calling from [facility] and states that the patient will be having shoulder surgery on Thursday 08/05/17. The anesthesiologist would like to get the Carotid Artery Ultrasound that was done in June of 2016 prior to surgery. Please advise.    Fax# [phone] attention Cathy  Cb#[phone] ext [phone]

## 2017-08-04 NOTE — Telephone Encounter (Signed)
Called back and LMOM for [place]- I don't have this ultrasound either unfortunately.  Let me know if I can otherwise be helpful However then called pt and found out this was done at [facility], was able to find under care everywhere.  Copied and pasted into a letter and will fax as requested

## 2017-08-05 ENCOUNTER — Encounter: Payer: Self-pay | Admitting: Orthopaedic Surgery

## 2017-08-05 DIAGNOSIS — M94211 Chondromalacia, right shoulder: Secondary | ICD-10-CM | POA: Diagnosis not present

## 2017-08-05 DIAGNOSIS — S46111A Strain of muscle, fascia and tendon of long head of biceps, right arm, initial encounter: Secondary | ICD-10-CM | POA: Diagnosis not present

## 2017-08-05 DIAGNOSIS — M19011 Primary osteoarthritis, right shoulder: Secondary | ICD-10-CM | POA: Diagnosis not present

## 2017-08-05 DIAGNOSIS — M75121 Complete rotator cuff tear or rupture of right shoulder, not specified as traumatic: Secondary | ICD-10-CM | POA: Diagnosis not present

## 2017-08-09 ENCOUNTER — Ambulatory Visit (INDEPENDENT_AMBULATORY_CARE_PROVIDER_SITE_OTHER): Payer: 59 | Admitting: Orthopaedic Surgery

## 2017-08-09 ENCOUNTER — Encounter (INDEPENDENT_AMBULATORY_CARE_PROVIDER_SITE_OTHER): Payer: Self-pay | Admitting: Orthopaedic Surgery

## 2017-08-09 VITALS — BP 136/82 | HR 89 | Ht 64.0 in | Wt 150.0 lb

## 2017-08-09 DIAGNOSIS — M25511 Pain in right shoulder: Secondary | ICD-10-CM

## 2017-08-09 DIAGNOSIS — G8929 Other chronic pain: Secondary | ICD-10-CM

## 2017-08-09 NOTE — Progress Notes (Signed)
Office Visit Note   Patient: Leslie Crawford           Date of Birth: 04/14/1950           MRN: [id number] Visit Date: 08/09/2017              Requested by: Darreld Mclean, MD [address] [place], [place] [address] PCP: Darreld Mclean, MD   Assessment & Plan: Visit Diagnoses:  1. Chronic right shoulder pain     Plan: 4 days status post arthroscopic SCD, DCR and mini open rotator cuff tear repair.  The biceps tendon had previously ruptured.  Doing quite well without any problems.  Dressing was changed and new Steri-Strips applied.  Begin circumduction exercises.  Return to the office 10 to 14 days and consider outpatient therapy  Follow-Up Instructions: Return in about 2 weeks (around 08/23/2017).   Orders:  No orders of the defined types were placed in this encounter.  No orders of the defined types were placed in this encounter.     Procedures: No procedures performed   Clinical Data: No additional findings.   Subjective: Chief Complaint  Patient presents with  . Post-op Follow-up    08/05/17 r shoulder surgery things going good  No related fever or chills.  No shortness of breath or chest pain  HPI  Review of Systems  Constitutional: Positive for fatigue.  HENT: Negative for ear pain.   Respiratory: Negative for cough and shortness of breath.   Cardiovascular: Negative for leg swelling.  Gastrointestinal: Negative for constipation and diarrhea.  Genitourinary: Negative for difficulty urinating.  Musculoskeletal: Negative for back pain and neck pain.  Skin: Negative for rash.  Allergic/Immunologic: Negative for food allergies.  Neurological: Positive for weakness. Negative for numbness.  Hematological: Does not bruise/bleed easily.  Psychiatric/Behavioral: Negative for sleep disturbance.     Objective: Vital Signs: BP 136/82 (BP Location: Right Leg, Patient Position: Sitting, Cuff Size: Normal)   Pulse 89   Ht 5\' 4"  (1.626 m)    Wt 150 lb (68 kg)   BMI 25.75 kg/m   Physical Exam  Ortho Exam awake alert and oriented x3.  Comfortable sitting.  Incision right shoulder clean and dry.  New Steri-Strips applied.  Neurovascular exam intact.  Specialty Comments:  No specialty comments available.  Imaging: No results found.   PMFS History: Patient Active Problem List   Diagnosis Date Noted  . BACK PAIN 01/06/2010  . ADENOCARCINOMA, BREAST 10/26/2008  . ANXIETY 10/26/2008  . HYPERLIPIDEMIA 07/17/2008  . GERD 07/01/2007  . OSTEOPENIA 07/01/2007  . INSOMNIA-SLEEP DISORDER-UNSPEC 07/01/2007   Past Medical History:  Diagnosis Date  . Allergy   . Anemia   . Anxiety   . Breast cancer ([place]) 2010   S/P Chemo/RadTx- Dr. Eston Esters  . Depression   . GERD (gastroesophageal reflux disease)   . Hyperlipidemia   . Osteopenia   . Osteoporosis     Family History  Problem Relation Age of Onset  . Cancer Mother        Throat cancer  . Cancer Father        Stomach cancer  . Early death Maternal Grandmother   . Pneumonia Maternal Grandmother   . Heart attack Maternal Grandfather   . Heart attack Paternal Grandfather     Past Surgical History:  Procedure Laterality Date  . ABDOMINAL HYSTERECTOMY     Total hysterectomy with BSO  . BREAST LUMPECTOMY Left 10/29/2008  Negative markers- 1 positive node  . SHOULDER SURGERY    . TONSILLECTOMY AND ADENOIDECTOMY     Social History   Occupational History  . Not on file  Tobacco Use  . Smoking status: Former Research scientist (life sciences)  . Smokeless tobacco: Never Used  Substance and Sexual Activity  . Alcohol use: Yes    Alcohol/week: 3.0 oz    Types: 5 Cans of beer per week  . Drug use: No  . Sexual activity: Yes    Birth control/protection: Surgical

## 2017-08-12 ENCOUNTER — Telehealth (INDEPENDENT_AMBULATORY_CARE_PROVIDER_SITE_OTHER): Payer: Self-pay | Admitting: Orthopaedic Surgery

## 2017-08-12 NOTE — Telephone Encounter (Signed)
Can you please complete this ASAP? Thank you so much!

## 2017-08-12 NOTE — Telephone Encounter (Signed)
Please see me before you leave. Thank you.

## 2017-08-12 NOTE — Telephone Encounter (Signed)
I have the forms, but Dr. Durward Fortes needs to sign them before sending it. I can bring it over today.

## 2017-08-12 NOTE — Telephone Encounter (Signed)
Patient called stating there is some information missing for her short term disability to be complete.  (1) Clinical notes from the date of surgery and (2) the attending providers statement.  They can be faxed to     [phone]  Claim #[id number]  Attn: Short term disability   It is important that this missing information is sent to them today 7/18/19l

## 2017-08-13 ENCOUNTER — Ambulatory Visit (INDEPENDENT_AMBULATORY_CARE_PROVIDER_SITE_OTHER): Payer: 59 | Admitting: Medical

## 2017-08-13 ENCOUNTER — Encounter: Payer: Self-pay | Admitting: Medical

## 2017-08-13 VITALS — BP 110/70 | HR 75 | Temp 98.6°F | Resp 16 | Ht 64.0 in | Wt 154.3 lb

## 2017-08-13 DIAGNOSIS — B029 Zoster without complications: Secondary | ICD-10-CM | POA: Diagnosis not present

## 2017-08-13 DIAGNOSIS — M792 Neuralgia and neuritis, unspecified: Secondary | ICD-10-CM | POA: Diagnosis not present

## 2017-08-13 MED ORDER — FAMCICLOVIR 500 MG PO TABS: 500.0000 mg | ORAL_TABLET | Freq: Three times a day (TID) | ORAL | 0 refills | Status: DC

## 2017-08-13 MED ORDER — GABAPENTIN 100 MG PO CAPS: 100.0000 mg | ORAL_CAPSULE | Freq: Three times a day (TID) | ORAL | 0 refills | Status: DC

## 2017-08-13 NOTE — Patient Instructions (Addendum)
You do appear to have slight small early breakout and right sided flank/rib area.  You also have some neuropathic type pain in this area.  This is a area of prior shingles eruption.  Recent stress following surgery.  Shingles is possible despite having Shingrix vaccine.  I do think it is best to go ahead and start you on Famvir to reduce the chance of postherpetic neuralgia.  Prescribing gabapentin medication to use 1 tablet at night for nerve pain.  You might need to increase that in the near future.  Discussion on potential increase by next visit.  If signs and symptoms worsen before follow-up please let us know.  Follow-up in 7 days or as needed.

## 2017-08-13 NOTE — Progress Notes (Signed)
Subjective:    Patient ID: Leslie Crawford, female    DOB: 05/02/1950, 67 y.o.   MRN: [id number]  HPI  Pt in for some rt sided flank/rib area pain. Pt had shingles in past in same area of pain 3 years ago. Pt also states she got the shingrex vaccine series in the past as well.   Pt has pain that just started yesterday. Some itching and burning pain. Pt states somewhat like shingles pain in the past.  No cough, no fall, no injury or trauma.     Review of Systems  Constitutional: Negative for chills, fatigue and fever.  Respiratory: Negative for cough, choking, shortness of breath and wheezing.   Cardiovascular: Negative for chest pain and palpitations.  Gastrointestinal: Negative for abdominal pain.  Skin:       See hpi. Neuropathic pain similar to shinges in the past.  Hematological: Negative for adenopathy. Does not bruise/bleed easily.    Past Medical History:  Diagnosis Date  . Allergy   . Anemia   . Anxiety   . Breast cancer ([place]) 2010   S/P Chemo/RadTx- Dr. Eston Esters  . Depression   . GERD (gastroesophageal reflux disease)   . Hyperlipidemia   . Osteopenia   . Osteoporosis      Social History   Socioeconomic History  . Marital status: Married    Spouse name: Not on file  . Number of children: Not on file  . Years of education: Not on file  . Highest education level: Not on file  Occupational History  . Not on file  Social Needs  . Financial resource strain: Not on file  . Food insecurity:    Worry: Not on file    Inability: Not on file  . Transportation needs:    Medical: Not on file    Non-medical: Not on file  Tobacco Use  . Smoking status: Former Research scientist (life sciences)  . Smokeless tobacco: Never Used  Substance and Sexual Activity  . Alcohol use: Yes    Alcohol/week: 3.0 oz    Types: 5 Cans of beer per week  . Drug use: No  . Sexual activity: Yes    Birth control/protection: Surgical  Lifestyle  . Physical activity:    Days per week: Not on file   Minutes per session: Not on file  . Stress: Not on file  Relationships  . Social connections:    Talks on phone: Not on file    Gets together: Not on file    Attends religious service: Not on file    Active member of club or organization: Not on file    Attends meetings of clubs or organizations: Not on file    Relationship status: Not on file  . Intimate partner violence:    Fear of current or ex partner: Not on file    Emotionally abused: Not on file    Physically abused: Not on file    Forced sexual activity: Not on file  Other Topics Concern  . Not on file  Social History Narrative  . Not on file    Past Surgical History:  Procedure Laterality Date  . ABDOMINAL HYSTERECTOMY     Total hysterectomy with BSO  . BREAST LUMPECTOMY Left 10/29/2008   Negative markers- 1 positive node  . SHOULDER SURGERY    . TONSILLECTOMY AND ADENOIDECTOMY      Family History  Problem Relation Age of Onset  . Cancer Mother  Throat cancer  . Cancer Father        Stomach cancer  . Early death Maternal Grandmother   . Pneumonia Maternal Grandmother   . Heart attack Maternal Grandfather   . Heart attack Paternal Grandfather     Allergies  Allergen Reactions  . Topamax [Topiramate] Other (See Comments)    Decreased cognitive function    Current Outpatient Medications on File Prior to Visit  Medication Sig Dispense Refill  . amLODipine (NORVASC) 5 MG tablet Take 1 tablet (5 mg total) by mouth daily. 90 tablet 3  . losartan (COZAAR) 50 MG tablet Take 1 tablet (50 mg total) by mouth daily. 90 tablet 3  . magnesium 30 MG tablet Take 30 mg by mouth 2 (two) times daily.    Marland Kitchen oxyCODONE-acetaminophen (PERCOCET/ROXICET) 5-325 MG tablet     . pravastatin (PRAVACHOL) 20 MG tablet TAKE 1 TABLET BY MOUTH EVERY DAY 90 tablet 2  . temazepam (RESTORIL) 15 MG capsule Take 15 mg by mouth at bedtime as needed for sleep.     No current facility-administered medications on file prior to visit.      BP 110/70   Pulse 75   Temp 98.6 F (37 C) (Oral)   Resp 16   Ht 5\' 4"  (1.626 m)   Wt 154 lb 4.2 oz (70 kg)   SpO2 98%   BMI 26.48 kg/m      Objective:   Physical Exam  General- No acute distress. Pleasant patient. Neck- Full range of motion, no jvd Lungs- Clear, even and unlabored. Heart- regular rate and rhythm. Neurologic- CNII- XII grossly intact.  Skin- on patient's right lower rib area and right flank area and region and mid axillary level she has scattered very tiny 1 mm scabs present.  Presently no papules or vesicles.  This area is mildly tender to palpation.  No induration.  No obvious redness of the skin.     Assessment & Plan:  You do appear to have slight small early breakout and right sided flank/rib area.  You also have some neuropathic type pain in this area.  This is a area of prior shingles eruption.  Recent stress following surgery.  Shingles is possible despite having Shingrix vaccine.  I do think it is best to go ahead and start you on Famvir to reduce the chance of post herpetic neuralgia.  Prescribing gabapentin medication to use 1 tablet at night for nerve pain.  You might need to increase that in the near future.  Discussion on potential increase by next visit.  If signs and symptoms worsen before follow-up please let us know.  Follow-up in 7 days or as needed.  Mackie Pai, PA-C

## 2017-08-13 NOTE — Telephone Encounter (Signed)
completed

## 2017-08-19 ENCOUNTER — Ambulatory Visit (INDEPENDENT_AMBULATORY_CARE_PROVIDER_SITE_OTHER): Payer: 59 | Admitting: Orthopaedic Surgery

## 2017-08-19 ENCOUNTER — Encounter (INDEPENDENT_AMBULATORY_CARE_PROVIDER_SITE_OTHER): Payer: Self-pay | Admitting: Orthopaedic Surgery

## 2017-08-19 VITALS — BP 121/68 | HR 71 | Ht 64.0 in | Wt 154.0 lb

## 2017-08-19 DIAGNOSIS — G8929 Other chronic pain: Secondary | ICD-10-CM

## 2017-08-19 DIAGNOSIS — M25511 Pain in right shoulder: Secondary | ICD-10-CM

## 2017-08-19 NOTE — Progress Notes (Signed)
Office Visit Note   Patient: Leslie Crawford           Date of Birth: [birth year]           MRN: [id number] Visit Date: 08/19/2017              Requested by: Darreld Mclean, MD [address] [place], [place] [address] PCP: Darreld Mclean, MD   Assessment & Plan: Visit Diagnoses:  1. Chronic right shoulder pain     Plan: 2 weeks status post rotator cuff tear repair right shoulder.  Doing well.  Minimal discomfort.  Will start formal physical therapy at [facility] facility at [place] and continue with sling.  Office 2 weeks  Follow-Up Instructions: Return in about 2 weeks (around 09/02/2017).   Orders:  No orders of the defined types were placed in this encounter.  No orders of the defined types were placed in this encounter.     Procedures: No procedures performed   Clinical Data: No additional findings.   Subjective: Chief Complaint  Patient presents with  . Follow-up    2 WK F/ U THINGS GONG GOOD  Quite comfortable using the arm sling.  No significant pain.  No numbness or tingling.  Has been performing circumduction exercises  HPI  Review of Systems  Constitutional: Negative for fatigue and fever.  HENT: Negative for ear pain.   Eyes: Negative for pain.  Respiratory: Negative for cough and shortness of breath.   Cardiovascular: Negative for leg swelling.  Gastrointestinal: Negative for diarrhea and nausea.  Genitourinary: Negative for difficulty urinating.  Musculoskeletal: Negative for back pain.  Skin: Negative for rash.  Allergic/Immunologic: Negative for food allergies.  Neurological: Positive for weakness. Negative for numbness.  Psychiatric/Behavioral: Negative for sleep disturbance.     Objective: Vital Signs: BP 121/68 (BP Location: Right Arm, Patient Position: Sitting, Cuff Size: Normal)   Pulse 71   Ht 5\' 4"  (1.626 m)   Wt 154 lb (69.9 kg)   BMI 26.43 kg/m   Physical Exam  Ortho Exam awake alert and oriented  x3.  Comfortable sitting patient's right shoulder healing without problem.  I can abduct about 85 degrees and flex about 100 degrees.  Neurovascular exam intact.  Biceps had previously ruptured prior to surgery  Specialty Comments:  No specialty comments available.  Imaging: No results found.   PMFS History: Patient Active Problem List   Diagnosis Date Noted  . BACK PAIN 01/06/2010  . ADENOCARCINOMA, BREAST 10/26/2008  . ANXIETY 10/26/2008  . HYPERLIPIDEMIA 07/17/2008  . GERD 07/01/2007  . OSTEOPENIA 07/01/2007  . INSOMNIA-SLEEP DISORDER-UNSPEC 07/01/2007   Past Medical History:  Diagnosis Date  . Allergy   . Anemia   . Anxiety   . Breast cancer ([place]) 2010   S/P Chemo/RadTx- Dr. Eston Esters  . Depression   . GERD (gastroesophageal reflux disease)   . Hyperlipidemia   . Osteopenia   . Osteoporosis     Family History  Problem Relation Age of Onset  . Cancer Mother        Throat cancer  . Cancer Father        Stomach cancer  . Early death Maternal Grandmother   . Pneumonia Maternal Grandmother   . Heart attack Maternal Grandfather   . Heart attack Paternal Grandfather     Past Surgical History:  Procedure Laterality Date  . ABDOMINAL HYSTERECTOMY     Total hysterectomy with BSO  . BREAST LUMPECTOMY  Left 10/29/2008   Negative markers- 1 positive node  . SHOULDER SURGERY    . TONSILLECTOMY AND ADENOIDECTOMY     Social History   Occupational History  . Not on file  Tobacco Use  . Smoking status: Former Research scientist (life sciences)  . Smokeless tobacco: Never Used  Substance and Sexual Activity  . Alcohol use: Yes    Alcohol/week: 3.0 oz    Types: 5 Cans of beer per week  . Drug use: No  . Sexual activity: Yes    Birth control/protection: Surgical

## 2017-08-20 ENCOUNTER — Ambulatory Visit (INDEPENDENT_AMBULATORY_CARE_PROVIDER_SITE_OTHER): Payer: 59 | Admitting: Orthopaedic Surgery

## 2017-08-20 ENCOUNTER — Ambulatory Visit: Payer: 59 | Admitting: Medical

## 2017-08-20 ENCOUNTER — Telehealth (INDEPENDENT_AMBULATORY_CARE_PROVIDER_SITE_OTHER): Payer: Self-pay | Admitting: Orthopaedic Surgery

## 2017-08-20 NOTE — Telephone Encounter (Signed)
Spoke with pt and she wants to go to [facility] and gave me fax number [phone] to fax referal and last office visit

## 2017-08-20 NOTE — Telephone Encounter (Signed)
Patient states [facility] therapy can not get her in for initial appt for 2 weeks. Patient would like a referral to another PT if possible. Patient request a call to discuss a PT closer to her.

## 2017-08-25 ENCOUNTER — Telehealth (INDEPENDENT_AMBULATORY_CARE_PROVIDER_SITE_OTHER): Payer: Self-pay | Admitting: Orthopaedic Surgery

## 2017-08-25 NOTE — Telephone Encounter (Signed)
PT protocol for RCT and op notes faxded, LMOM for PT

## 2017-08-25 NOTE — Telephone Encounter (Signed)
Margaret from [facility] called requesting post-op notes, as well as the PT protocol faxed to [phone].  Joycelyn Schmid stated that the patient has an appointment today at 10:00 am and is requesting the information before her appointment.  If you have any questions, please call [phone]     *Joycelyn Schmid called the [facility] office to talk to Dr. Durward Fortes directly, but was told to call the [facility] office to have information sent.

## 2017-08-28 ENCOUNTER — Other Ambulatory Visit: Payer: Self-pay | Admitting: Family Medicine

## 2017-08-28 DIAGNOSIS — E785 Hyperlipidemia, unspecified: Secondary | ICD-10-CM

## 2017-09-03 ENCOUNTER — Ambulatory Visit: Payer: 59 | Admitting: Physical Therapy

## 2017-09-06 ENCOUNTER — Encounter (INDEPENDENT_AMBULATORY_CARE_PROVIDER_SITE_OTHER): Payer: Self-pay | Admitting: Orthopaedic Surgery

## 2017-09-06 ENCOUNTER — Ambulatory Visit (INDEPENDENT_AMBULATORY_CARE_PROVIDER_SITE_OTHER): Payer: 59 | Admitting: Orthopaedic Surgery

## 2017-09-06 VITALS — BP 124/73 | HR 77 | Ht 64.0 in | Wt 150.0 lb

## 2017-09-06 DIAGNOSIS — M25511 Pain in right shoulder: Secondary | ICD-10-CM

## 2017-09-06 DIAGNOSIS — G8929 Other chronic pain: Secondary | ICD-10-CM

## 2017-09-06 NOTE — Progress Notes (Signed)
Office Visit Note   Patient: Leslie Crawford           Date of Birth: Sep 06, 1950           MRN: [id number] Visit Date: 09/06/2017              Requested by: Darreld Mclean, MD [address] [place], [place] [address] PCP: Darreld Mclean, MD   Assessment & Plan: Visit Diagnoses:  1. Chronic right shoulder pain     Plan: 5 weeks status post rotator cuff tear repair right shoulder with an SCD and DCR.  Biceps tendon had ruptured prior to surgery.  Doing well with physical therapy.  Slowly wean from the sling.  Return to the office in a week and consider back to work status.  Physical therapy per protocol  Follow-Up Instructions: Return in about 1 week (around 09/13/2017).   Orders:  No orders of the defined types were placed in this encounter.  No orders of the defined types were placed in this encounter.     Procedures: No procedures performed   Clinical Data: No additional findings.   Subjective: Chief Complaint  Patient presents with  . Follow-up    08/05/17 R SHOULDER SURGERY NO ISSUES NO NUMBNESS, SWELLING OR PAIN    HPI  Review of Systems  Constitutional: Negative for fatigue and fever.  HENT: Negative for ear pain.   Eyes: Negative for pain.  Respiratory: Negative for cough and shortness of breath.   Cardiovascular: Negative for leg swelling.  Gastrointestinal: Negative for constipation and diarrhea.  Genitourinary: Negative for difficulty urinating.  Musculoskeletal: Negative for back pain and neck pain.  Skin: Negative for rash.  Allergic/Immunologic: Negative for food allergies.  Neurological: Negative for weakness and numbness.  Hematological: Does not bruise/bleed easily.  Psychiatric/Behavioral: Negative for sleep disturbance.     Objective: Vital Signs: BP 124/73 (BP Location: Left Arm, Patient Position: Sitting, Cuff Size: Normal)   Pulse 77   Ht 5\' 4"  (1.626 m)   Wt 150 lb (68 kg)   BMI 25.75 kg/m   Physical  Exam  Ortho Exam awake alert and oriented x3.  Comfortable sitting.  I could passively abduct her right arm to 90 degrees and almost fully overhead.  Incisions are healing nicely.  Skin intact.  Neurovascular exam intact.  Specialty Comments:  No specialty comments available.  Imaging: No results found.   PMFS History: Patient Active Problem List   Diagnosis Date Noted  . BACK PAIN 01/06/2010  . ADENOCARCINOMA, BREAST 10/26/2008  . ANXIETY 10/26/2008  . HYPERLIPIDEMIA 07/17/2008  . GERD 07/01/2007  . OSTEOPENIA 07/01/2007  . INSOMNIA-SLEEP DISORDER-UNSPEC 07/01/2007   Past Medical History:  Diagnosis Date  . Allergy   . Anemia   . Anxiety   . Breast cancer ([place]) 2010   S/P Chemo/RadTx- Dr. Eston Esters  . Depression   . GERD (gastroesophageal reflux disease)   . Hyperlipidemia   . Osteopenia   . Osteoporosis     Family History  Problem Relation Age of Onset  . Cancer Mother        Throat cancer  . Cancer Father        Stomach cancer  . Early death Maternal Grandmother   . Pneumonia Maternal Grandmother   . Heart attack Maternal Grandfather   . Heart attack Paternal Grandfather     Past Surgical History:  Procedure Laterality Date  . ABDOMINAL HYSTERECTOMY     Total hysterectomy  with BSO  . BREAST LUMPECTOMY Left 10/29/2008   Negative markers- 1 positive node  . SHOULDER SURGERY    . TONSILLECTOMY AND ADENOIDECTOMY     Social History   Occupational History  . Not on file  Tobacco Use  . Smoking status: Former Research scientist (life sciences)  . Smokeless tobacco: Never Used  Substance and Sexual Activity  . Alcohol use: Yes    Alcohol/week: 5.0 standard drinks    Types: 5 Cans of beer per week  . Drug use: No  . Sexual activity: Yes    Birth control/protection: Surgical

## 2017-09-13 ENCOUNTER — Encounter (INDEPENDENT_AMBULATORY_CARE_PROVIDER_SITE_OTHER): Payer: Self-pay | Admitting: Orthopaedic Surgery

## 2017-09-13 ENCOUNTER — Ambulatory Visit (INDEPENDENT_AMBULATORY_CARE_PROVIDER_SITE_OTHER): Payer: 59 | Admitting: Orthopaedic Surgery

## 2017-09-13 VITALS — BP 121/77 | HR 72 | Ht 64.0 in | Wt 150.0 lb

## 2017-09-13 DIAGNOSIS — M25511 Pain in right shoulder: Secondary | ICD-10-CM

## 2017-09-13 DIAGNOSIS — G8929 Other chronic pain: Secondary | ICD-10-CM

## 2017-09-13 NOTE — Progress Notes (Signed)
Office Visit Note   Patient: Leslie Crawford           Date of Birth: [birth year]           MRN: [id number] Visit Date: 09/13/2017              Requested by: Darreld Mclean, MD [address] [place], [place] [address] PCP: Darreld Mclean, MD   Assessment & Plan: Visit Diagnoses:  1. Chronic right shoulder pain     Plan: 6 weeks status post rotator cuff tear repair right shoulder.  Still weak.  I am concerned about her return to work at this point.  Continue physical therapy for strengthening exercises and reevaluate in 2 weeks.  Still disabled  Follow-Up Instructions: Return in about 2 weeks (around 09/27/2017).   Orders:  No orders of the defined types were placed in this encounter.  No orders of the defined types were placed in this encounter.     Procedures: No procedures performed   Clinical Data: No additional findings.   Subjective: Chief Complaint  Patient presents with  . Follow-up    DOING GREAT WOULD LIE TO DISCUSS GOING BACK TO WORK    HPI  Review of Systems  Constitutional: Negative for fatigue and fever.  HENT: Negative for ear pain.   Eyes: Negative for pain.  Respiratory: Negative for cough and shortness of breath.   Cardiovascular: Negative for leg swelling.  Gastrointestinal: Negative for constipation and diarrhea.  Genitourinary: Negative for difficulty urinating.  Musculoskeletal: Negative for back pain and neck pain.  Skin: Negative for rash.  Allergic/Immunologic: Negative for food allergies.  Neurological: Negative for weakness and numbness.  Hematological: Does not bruise/bleed easily.  Psychiatric/Behavioral: Negative for sleep disturbance.     Objective: Vital Signs: BP 121/77 (BP Location: Right Arm, Patient Position: Sitting, Cuff Size: Normal)   Pulse 72   Ht 5\' 4"  (1.626 m)   Wt 150 lb (68 kg)   BMI 25.75 kg/m   Physical Exam  Ortho Exam awake alert and oriented x3.  Comfortable sitting.  Unable  to place her right arm fully overhead and over 90 degrees of abduction but she has a significant lag with weakness.  Negative impingement.  Grip and good release.  UnAble to hold her arm over her head  Specialty Comments:  No specialty comments available.  Imaging: No results found.   PMFS History: Patient Active Problem List   Diagnosis Date Noted  . BACK PAIN 01/06/2010  . ADENOCARCINOMA, BREAST 10/26/2008  . ANXIETY 10/26/2008  . HYPERLIPIDEMIA 07/17/2008  . GERD 07/01/2007  . OSTEOPENIA 07/01/2007  . INSOMNIA-SLEEP DISORDER-UNSPEC 07/01/2007   Past Medical History:  Diagnosis Date  . Allergy   . Anemia   . Anxiety   . Breast cancer ([place]) 2010   S/P Chemo/RadTx- Dr. Eston Esters  . Depression   . GERD (gastroesophageal reflux disease)   . Hyperlipidemia   . Osteopenia   . Osteoporosis     Family History  Problem Relation Age of Onset  . Cancer Mother        Throat cancer  . Cancer Father        Stomach cancer  . Early death Maternal Grandmother   . Pneumonia Maternal Grandmother   . Heart attack Maternal Grandfather   . Heart attack Paternal Grandfather     Past Surgical History:  Procedure Laterality Date  . ABDOMINAL HYSTERECTOMY     Total hysterectomy with BSO  .  BREAST LUMPECTOMY Left 10/29/2008   Negative markers- 1 positive node  . SHOULDER SURGERY    . TONSILLECTOMY AND ADENOIDECTOMY     Social History   Occupational History  . Not on file  Tobacco Use  . Smoking status: Former Research scientist (life sciences)  . Smokeless tobacco: Never Used  Substance and Sexual Activity  . Alcohol use: Yes    Alcohol/week: 5.0 standard drinks    Types: 5 Cans of beer per week  . Drug use: No  . Sexual activity: Yes    Birth control/protection: Surgical

## 2017-10-01 ENCOUNTER — Ambulatory Visit (INDEPENDENT_AMBULATORY_CARE_PROVIDER_SITE_OTHER): Payer: 59 | Admitting: Orthopaedic Surgery

## 2017-10-01 ENCOUNTER — Encounter (INDEPENDENT_AMBULATORY_CARE_PROVIDER_SITE_OTHER): Payer: Self-pay | Admitting: Orthopaedic Surgery

## 2017-10-01 VITALS — BP 132/76 | HR 66 | Ht 64.0 in | Wt 150.0 lb

## 2017-10-01 DIAGNOSIS — M25511 Pain in right shoulder: Secondary | ICD-10-CM

## 2017-10-01 DIAGNOSIS — G8929 Other chronic pain: Secondary | ICD-10-CM

## 2017-10-01 NOTE — Progress Notes (Signed)
Office Visit Note   Patient: Leslie Crawford           Date of Birth: Jul 20, 1950           MRN: [id number] Visit Date: 10/01/2017              Requested by: Darreld Mclean, MD [address] [place], [place] [address] PCP: Darreld Mclean, MD   Assessment & Plan: Visit Diagnoses:  1. Chronic right shoulder pain     Plan: 2 months status post rotator cuff tear repair of right shoulder and doing well.  Went back to work last week.  Long discussion regarding to be the modification being careful with her shoulder next several months to allow full healing and strengthening to see her back as needed.  Leslie Crawford is happy with her progress  Follow-Up Instructions: Return if symptoms worsen or fail to improve.   Orders:  No orders of the defined types were placed in this encounter.  No orders of the defined types were placed in this encounter.     Procedures: No procedures performed   Clinical Data: No additional findings.   Subjective: Chief Complaint  Patient presents with  . Follow-up    R SHOULDER ROTATOR CUFF TEAR 08/05/17, THINGS GOING GREAT. PT WENT BACK TO WORK 09/24/17    HPI  Review of Systems  Constitutional: Negative for fatigue.  HENT: Negative for ear pain.   Eyes: Negative for pain.  Respiratory: Negative for cough.   Cardiovascular: Negative for leg swelling.  Gastrointestinal: Negative for constipation and diarrhea.  Genitourinary: Negative for difficulty urinating.  Musculoskeletal: Negative for back pain.  Skin: Negative for rash.  Allergic/Immunologic: Negative for food allergies.  Neurological: Negative for weakness.  Hematological: Does not bruise/bleed easily.  Psychiatric/Behavioral: Negative for sleep disturbance.     Objective: Vital Signs: BP 132/76 (BP Location: Right Arm, Patient Position: Sitting, Cuff Size: Normal)   Pulse 66   Ht 5\' 4"  (1.626 m)   Wt 150 lb (68 kg)   BMI 25.75 kg/m   Physical  Exam  Ortho Exam awake alert and oriented x3.  Comfortable sitting.  Able to place her right arm fully overhead.  Negative impingement.  Incision right shoulder is healed without problem.  Skin intact good grip and good release  Specialty Comments:  No specialty comments available.  Imaging: No results found.   PMFS History: Patient Active Problem List   Diagnosis Date Noted  . BACK PAIN 01/06/2010  . ADENOCARCINOMA, BREAST 10/26/2008  . ANXIETY 10/26/2008  . HYPERLIPIDEMIA 07/17/2008  . GERD 07/01/2007  . OSTEOPENIA 07/01/2007  . INSOMNIA-SLEEP DISORDER-UNSPEC 07/01/2007   Past Medical History:  Diagnosis Date  . Allergy   . Anemia   . Anxiety   . Breast cancer ([place]) 2010   S/P Chemo/RadTx- Leslie Crawford  . Depression   . GERD (gastroesophageal reflux disease)   . Hyperlipidemia   . Osteopenia   . Osteoporosis     Family History  Problem Relation Age of Onset  . Cancer Mother        Throat cancer  . Cancer Father        Stomach cancer  . Early death Maternal Grandmother   . Pneumonia Maternal Grandmother   . Heart attack Maternal Grandfather   . Heart attack Paternal Grandfather     Past Surgical History:  Procedure Laterality Date  . ABDOMINAL HYSTERECTOMY     Total hysterectomy with BSO  .  BREAST LUMPECTOMY Left 10/29/2008   Negative markers- 1 positive node  . SHOULDER SURGERY    . TONSILLECTOMY AND ADENOIDECTOMY     Social History   Occupational History  . Not on file  Tobacco Use  . Smoking status: Former Research scientist (life sciences)  . Smokeless tobacco: Never Used  Substance and Sexual Activity  . Alcohol use: Yes    Alcohol/week: 5.0 standard drinks    Types: 5 Cans of beer per week  . Drug use: No  . Sexual activity: Yes    Birth control/protection: Surgical

## 2017-11-09 ENCOUNTER — Other Ambulatory Visit: Payer: Self-pay | Admitting: Family Medicine

## 2017-11-29 ENCOUNTER — Other Ambulatory Visit: Payer: Self-pay | Admitting: Family Medicine

## 2017-12-02 ENCOUNTER — Other Ambulatory Visit: Payer: Self-pay | Admitting: Obstetrics and Gynecology

## 2017-12-02 DIAGNOSIS — Z1231 Encounter for screening mammogram for malignant neoplasm of breast: Secondary | ICD-10-CM

## 2017-12-17 ENCOUNTER — Encounter: Payer: Self-pay | Admitting: Gastroenterology

## 2017-12-23 ENCOUNTER — Other Ambulatory Visit: Payer: Self-pay | Admitting: Family Medicine

## 2017-12-27 ENCOUNTER — Other Ambulatory Visit: Payer: Self-pay | Admitting: Family Medicine

## 2018-01-17 ENCOUNTER — Ambulatory Visit
Admission: RE | Admit: 2018-01-17 | Discharge: 2018-01-17 | Disposition: A | Discharge status: Home / Self Care | Payer: 59 | Source: Ambulatory Visit | Attending: Obstetrics and Gynecology | Admitting: Obstetrics and Gynecology

## 2018-01-17 DIAGNOSIS — Z1231 Encounter for screening mammogram for malignant neoplasm of breast: Secondary | ICD-10-CM

## 2018-01-17 HISTORY — DX: Personal history of antineoplastic chemotherapy: Z92.21

## 2018-01-17 HISTORY — DX: Personal history of irradiation: Z92.3

## 2018-01-20 ENCOUNTER — Ambulatory Visit
Admission: RE | Admit: 2018-01-20 | Discharge: 2018-01-20 | Disposition: A | Discharge status: Home / Self Care | Payer: 59 | Source: Ambulatory Visit | Attending: Obstetrics and Gynecology | Admitting: Obstetrics and Gynecology

## 2018-01-20 ENCOUNTER — Other Ambulatory Visit: Payer: Self-pay | Admitting: Obstetrics and Gynecology

## 2018-01-20 DIAGNOSIS — R921 Mammographic calcification found on diagnostic imaging of breast: Secondary | ICD-10-CM

## 2018-01-20 DIAGNOSIS — R928 Other abnormal and inconclusive findings on diagnostic imaging of breast: Secondary | ICD-10-CM

## 2018-02-11 ENCOUNTER — Ambulatory Visit (AMBULATORY_SURGERY_CENTER): Payer: Self-pay | Admitting: *Deleted

## 2018-02-11 ENCOUNTER — Encounter: Payer: Self-pay | Admitting: Gastroenterology

## 2018-02-11 VITALS — Ht 64.0 in | Wt 157.0 lb

## 2018-02-11 DIAGNOSIS — Z1211 Encounter for screening for malignant neoplasm of colon: Secondary | ICD-10-CM

## 2018-02-11 MED ORDER — NA SULFATE-K SULFATE-MG SULF 17.5-3.13-1.6 GM/177ML PO SOLN: 1.0000 | Freq: Once | ORAL | 0 refills | Status: AC

## 2018-02-11 NOTE — Progress Notes (Signed)
No egg or soy allergy known to patient   issues with past sedation with any surgeries  or procedures of PONV , no intubation problems  No diet pills per patient No home 02 use per patient  No blood thinners per patient  Pt denies issues with constipation  No A fib or A flutter  EMMI video sent to pt's e mail - pt declined   

## 2018-02-25 ENCOUNTER — Encounter: Payer: Self-pay | Admitting: Gastroenterology

## 2018-02-25 ENCOUNTER — Ambulatory Visit (AMBULATORY_SURGERY_CENTER): Payer: 59 | Admitting: Gastroenterology

## 2018-02-25 VITALS — BP 130/61 | HR 60 | Temp 97.5°F | Resp 14 | Ht 64.0 in | Wt 150.0 lb

## 2018-02-25 DIAGNOSIS — Z1211 Encounter for screening for malignant neoplasm of colon: Secondary | ICD-10-CM | POA: Diagnosis present

## 2018-02-25 MED ORDER — SODIUM CHLORIDE 0.9 % IV SOLN: 500.0000 mL | Freq: Once | INTRAVENOUS | Status: DC

## 2018-02-25 NOTE — Progress Notes (Signed)
Report given to PACU, vss 

## 2018-02-25 NOTE — Op Note (Signed)
[facility] Patient Name: Leslie Crawford Procedure Date: 02/25/2018 7:58 AM MRN: [id number] Endoscopist: Ladene Artist , MD Age: 68 Referring MD:  Date of Birth: 03/07/1950 Gender: Female Account #: [id number] Procedure:                Colonoscopy Indications:              Screening for colorectal malignant neoplasm Medicines:                Monitored Anesthesia Care Procedure:                Pre-Anesthesia Assessment:                           - Prior to the procedure, a History and Physical                            was performed, and patient medications and                            allergies were reviewed. The patient's tolerance of                            previous anesthesia was also reviewed. The risks                            and benefits of the procedure and the sedation                            options and risks were discussed with the patient.                            All questions were answered, and informed consent                            was obtained. Prior Anticoagulants: The patient has                            taken no previous anticoagulant or antiplatelet                            agents. ASA Grade Assessment: II - A patient with                            mild systemic disease. After reviewing the risks                            and benefits, the patient was deemed in                            satisfactory condition to undergo the procedure.                           After obtaining informed consent, the colonoscope  was passed under direct vision. Throughout the                            procedure, the patient's blood pressure, pulse, and                            oxygen saturations were monitored continuously. The                            Model CF-HQ190L [id number]) scope was introduced                            through the anus and advanced to the the cecum,                            identified by  appendiceal orifice and ileocecal                            valve. The ileocecal valve, appendiceal orifice,                            and rectum were photographed. The quality of the                            bowel preparation was good. The colonoscopy was                            performed without difficulty. The patient tolerated                            the procedure well. Scope In: 8:12:39 AM Scope Out: 8:27:28 AM Scope Withdrawal Time: 0 hours 10 minutes 6 seconds  Total Procedure Duration: 0 hours 14 minutes 49 seconds  Findings:                 The perianal and digital rectal examinations were                            normal.                           Internal hemorrhoids were found during                            retroflexion. The hemorrhoids were small and Grade                            I (internal hemorrhoids that do not prolapse).                           The exam was otherwise without abnormality on                            direct and retroflexion views. Complications:            No immediate complications.  Estimated blood loss:                            None. Estimated Blood Loss:     Estimated blood loss: none. Impression:               - Internal hemorrhoids.                           - The examination was otherwise normal on direct                            and retroflexion views.                           - No specimens collected. Recommendation:           - Repeat colonoscopy in 10 years for screening                            purposes.                           - Patient has a contact number available for                            emergencies. The signs and symptoms of potential                            delayed complications were discussed with the                            patient. Return to normal activities tomorrow.                            Written discharge instructions were provided to the                            patient.                            - Resume previous diet.                           - Continue present medications. Ladene Artist, MD 02/25/2018 8:32:02 AM This report has been signed electronically.

## 2018-02-25 NOTE — Patient Instructions (Signed)
Please read handouts provided. Continue present medications.      YOU HAD AN ENDOSCOPIC PROCEDURE TODAY AT [facility]:   Refer to the procedure report that was given to you for any specific questions about what was found during the examination.  If the procedure report does not answer your questions, please call your gastroenterologist to clarify.  If you requested that your care partner not be given the details of your procedure findings, then the procedure report has been included in a sealed envelope for you to review at your convenience later.  YOU SHOULD EXPECT: Some feelings of bloating in the abdomen. Passage of more gas than usual.  Walking can help get rid of the air that was put into your GI tract during the procedure and reduce the bloating. If you had a lower endoscopy (such as a colonoscopy or flexible sigmoidoscopy) you may notice spotting of blood in your stool or on the toilet paper. If you underwent a bowel prep for your procedure, you may not have a normal bowel movement for a few days.  Please Note:  You might notice some irritation and congestion in your nose or some drainage.  This is from the oxygen used during your procedure.  There is no need for concern and it should clear up in a day or so.  SYMPTOMS TO REPORT IMMEDIATELY:   Following lower endoscopy (colonoscopy or flexible sigmoidoscopy):  Excessive amounts of blood in the stool  Significant tenderness or worsening of abdominal pains  Swelling of the abdomen that is new, acute  Fever of 100F or higher    For urgent or emergent issues, a gastroenterologist can be reached at any hour by calling [phone].   DIET:  We do recommend a small meal at first, but then you may proceed to your regular diet.  Drink plenty of fluids but you should avoid alcoholic beverages for 24 hours.  ACTIVITY:  You should plan to take it easy for the rest of today and you should NOT DRIVE or use heavy machinery  until tomorrow (because of the sedation medicines used during the test).    FOLLOW UP: Our staff will call the number listed on your records the next business day following your procedure to check on you and address any questions or concerns that you may have regarding the information given to you following your procedure. If we do not reach you, we will leave a message.  However, if you are feeling well and you are not experiencing any problems, there is no need to return our call.  We will assume that you have returned to your regular daily activities without incident.  If any biopsies were taken you will be contacted by phone or by letter within the next 1-3 weeks.  Please call us at [phone] if you have not heard about the biopsies in 3 weeks.    SIGNATURES/CONFIDENTIALITY: You and/or your care partner have signed paperwork which will be entered into your electronic medical record.  These signatures attest to the fact that that the information above on your After Visit Summary has been reviewed and is understood.  Full responsibility of the confidentiality of this discharge information lies with you and/or your care-partner.

## 2018-02-28 ENCOUNTER — Telehealth: Payer: Self-pay | Admitting: *Deleted

## 2018-02-28 ENCOUNTER — Telehealth: Payer: Self-pay

## 2018-02-28 NOTE — Telephone Encounter (Signed)
Left message on f/u call 

## 2018-02-28 NOTE — Telephone Encounter (Signed)
  Follow up Call-  Call back number 02/25/2018  Post procedure Call Back phone  # [phone]  Permission to leave phone message Yes  Some recent data might be hidden     Patient questions:  Do you have a fever, pain , or abdominal swelling? No. Pain Score  0 *  Have you tolerated food without any problems? Yes.    Have you been able to return to your normal activities? Yes.    Do you have any questions about your discharge instructions: Diet   No. Medications  No. Follow up visit  No.  Do you have questions or concerns about your Care? No.  Actions: * If pain score is 4 or above: No action needed, pain <4.

## 2018-05-11 ENCOUNTER — Encounter: Payer: Self-pay | Admitting: Family Medicine

## 2018-05-11 ENCOUNTER — Other Ambulatory Visit: Payer: Self-pay

## 2018-05-11 ENCOUNTER — Ambulatory Visit (INDEPENDENT_AMBULATORY_CARE_PROVIDER_SITE_OTHER): Payer: 59 | Admitting: Family Medicine

## 2018-05-11 ENCOUNTER — Other Ambulatory Visit: Payer: Self-pay | Admitting: Family Medicine

## 2018-05-11 DIAGNOSIS — E785 Hyperlipidemia, unspecified: Secondary | ICD-10-CM

## 2018-05-11 DIAGNOSIS — I10 Essential (primary) hypertension: Secondary | ICD-10-CM

## 2018-05-11 DIAGNOSIS — E538 Deficiency of other specified B group vitamins: Secondary | ICD-10-CM | POA: Diagnosis not present

## 2018-05-11 DIAGNOSIS — M8580 Other specified disorders of bone density and structure, unspecified site: Secondary | ICD-10-CM

## 2018-05-11 DIAGNOSIS — Z131 Encounter for screening for diabetes mellitus: Secondary | ICD-10-CM

## 2018-05-11 NOTE — Patient Instructions (Addendum)
It was very nice to talk with you today!  I am glad you are doing so well  I have ordered routine labs for you, when feasible please schedule a lab visit with my office. You also need a Pneumovax 23 pneumonia booster.  This can be done either at my office as a nurse visit, or at your drugstore per your preference  Otherwise, it appears that you are all caught up with your health maintenance.  Please continue to take care of yourself, let us visit in the office in about 6 months  JC

## 2018-05-11 NOTE — Progress Notes (Signed)
[place] at [facility] [address] [place], [place] [address] [phone]  Date:  05/11/2018   Name:  Leslie Crawford   DOB:  12/21/1950   MRN:  [id number]  PCP:  Leslie Mclean, MD    Chief Complaint: No chief complaint on file.   History of Present Illness:  Leslie Crawford is a 68 y.o. very pleasant female patient who presents with the following:  Virtual visit today due to COVID-19 pandemic Patient location is home Provider location is office Patient identity confirmed with name and date of birth, patient gives permission today for virtual visit  I last saw Leslie Crawford in the office about 1 year ago-she has history of hypertension, hyperlipidemia, breast cancer She is also been seen by neurology for ocular migraine She had breast cancer approximately a decade ago, diagnosed in 2010.  She had surgery, chemo and radiation.  She is not on any oral medication durrently.  She does continue regular mammogram Her last mammogram was in December, up-to-date-she did need a biopsy at that time, but thankfully it was normal  Most recent labs done about 1 year ago She has been working from home for about one month now- she is enjoying it actually She is grateful to be working and her family is doing well Her health is good She had rotator cuff surgery last summer- this is now doing well, it did take a while to heal  She does get occular migraine in "clusters," she may have several weeks with none but then several in a week.  She is not on any particular medication for this issue  She saw her GYN, who put her on prozac 10 mg and she is sleeping well with this  Her GYN did a bone density for her the last week of December 2019, they recommended that she take calcium and vitamin D which she is doing  Amlodipine 5 Losartan 50  Prozac 10 mg Pravastatin 20 B12 1000 mcg daily  Patient Active Problem List   Diagnosis Date  Noted  . BACK PAIN 01/06/2010  . ADENOCARCINOMA, BREAST 10/26/2008  . ANXIETY 10/26/2008  . HYPERLIPIDEMIA 07/17/2008  . GERD 07/01/2007  . OSTEOPENIA 07/01/2007  . INSOMNIA-SLEEP DISORDER-UNSPEC 07/01/2007    Past Medical History:  Diagnosis Date  . Allergy   . Anemia   . Anxiety   . Breast cancer ([place]) 2010   S/P Chemo/RadTx- Dr. Eston Esters  . Depression   . GERD (gastroesophageal reflux disease)    past hx- not current  . Hyperlipidemia   . Hypertension   . Osteopenia   . Osteoporosis    improved- no meds-   . Personal history of chemotherapy   . Personal history of radiation therapy     Past Surgical History:  Procedure Laterality Date  . ABDOMINAL HYSTERECTOMY     Total hysterectomy with BSO  . BREAST LUMPECTOMY Left 10/29/2008   Negative markers- 1 positive node  . COLONOSCOPY    . POLYPECTOMY    . SEPTOPLASTY    . SHOULDER SURGERY    . TONSILLECTOMY AND ADENOIDECTOMY      Social History   Tobacco Use  . Smoking status: Former Research scientist (life sciences)  . Smokeless tobacco: Never Used  Substance Use Topics  . Alcohol use: Yes    Alcohol/week: 5.0 standard drinks    Types: 5 Cans of beer per week  . Drug use: No    Family History  Problem Relation Age of Onset  . Cancer Mother        Throat cancer  . Esophageal cancer Mother   . Cancer Father        Stomach cancer  . Stomach cancer Father   . Early death Maternal Grandmother   . Pneumonia Maternal Grandmother   . Heart attack Maternal Grandfather   . Heart attack Paternal Grandfather   . Colon cancer Neg Hx   . Colon polyps Neg Hx   . Rectal cancer Neg Hx     Allergies  Allergen Reactions  . Topamax [Topiramate] Other (See Comments)    Decreased cognitive function    Medication list has been reviewed and updated.  Current Outpatient Medications on File Prior to Visit  Medication Sig Dispense Refill  . amLODipine (NORVASC) 5 MG tablet Take 1 tablet (5 mg total) by mouth daily. 90 tablet 3  .  amLODipine (NORVASC) 5 MG tablet TAKE 1 TABLET BY MOUTH ONCE DAILY 90 tablet 1  . amLODipine (NORVASC) 5 MG tablet TAKE 1 TABLET BY MOUTH DAILY 90 tablet 1  . Biotin 3 MG TABS Take by mouth daily.    . famciclovir (FAMVIR) 500 MG tablet Take 1 tablet (500 mg total) by mouth 3 (three) times daily. (Patient not taking: Reported on 02/25/2018) 21 tablet 0  . FLUoxetine (PROZAC) 10 MG tablet Take 10 mg by mouth daily.    Marland Kitchen gabapentin (NEURONTIN) 100 MG capsule Take 1 capsule (100 mg total) by mouth 3 (three) times daily. (Patient not taking: Reported on 02/25/2018) 30 capsule 0  . losartan (COZAAR) 100 MG tablet TAKE 1/2 (ONE-HALF) TABLET BY MOUTH ONCE DAILY 30 tablet 5  . magnesium 30 MG tablet Take 30 mg by mouth 2 (two) times daily.    Marland Kitchen oxyCODONE-acetaminophen (PERCOCET/ROXICET) 5-325 MG tablet     . pravastatin (PRAVACHOL) 20 MG tablet TAKE 1 TABLET BY MOUTH EVERY DAY 90 tablet 1  . simvastatin (ZOCOR) 10 MG tablet Please specify directions, refills and quantity (Patient not taking: Reported on 02/25/2018) 90 tablet 1  . temazepam (RESTORIL) 15 MG capsule Take 15 mg by mouth at bedtime as needed for sleep.    . vitamin B-12 (CYANOCOBALAMIN) 1000 MCG tablet Take 1,000 mcg by mouth daily.     No current facility-administered medications on file prior to visit.     Review of Systems: As per HPI- otherwise negative.  No fever or cough   Physical Examination: There were no vitals filed for this visit. There were no vitals filed for this visit. There is no height or weight on file to calculate BMI. Ideal Body Weight:    Patient is obseved via camea.  She looks well, normal self.  No cough, wheezing, tachypnea or distress is observed  Assessment and Plan: Dyslipidemia - Plan: Lipid panel  Benign essential hypertension - Plan: CBC, Comprehensive metabolic panel  Osteopenia, unspecified location  B12 deficiency - Plan: B12  Screening for diabetes mellitus - Plan: Hemoglobin A1c  Virtual  visit today due to COVID-19 outbreak.  [name] is doing very well, she really has no concerns today.  Her health maintenance is up-to-date except for she is due for labs and a Pneumovax 23 I have ordered labs for her, she will have these done when it is safe to do so Pneumovax 23 is due, suggest that she have this done as a nurse visit or her drugstore She notes that a low-dose of Prozac is helping her sleep She is  tolerating all her medications well, notes that her blood pressure has been well controlled whenever it is checked  I invited and answered all questions, summary sent to patient via my chart as below:  It was very nice to talk with you today!  I am glad you are doing so well  I have ordered routine labs for you, when feasible please schedule a lab visit with my office. You also need a Pneumovax 23 pneumonia booster.  This can be done either at my office as a nurse visit, or at your drugstore per your preference  Otherwise, it appears that you are all caught up with your health maintenance.  Please continue to take care of yourself, let us visit in the office in about 6 months  JC  Signed Lamar Blinks, MD

## 2018-06-05 ENCOUNTER — Other Ambulatory Visit: Payer: Self-pay | Admitting: Family Medicine

## 2018-10-10 ENCOUNTER — Other Ambulatory Visit: Payer: Self-pay | Admitting: Family Medicine

## 2018-10-28 ENCOUNTER — Other Ambulatory Visit: Payer: Self-pay | Admitting: Family Medicine

## 2018-11-27 ENCOUNTER — Other Ambulatory Visit: Payer: Self-pay | Admitting: Family Medicine

## 2018-12-27 ENCOUNTER — Other Ambulatory Visit: Payer: Self-pay | Admitting: Obstetrics and Gynecology

## 2018-12-27 DIAGNOSIS — Z1231 Encounter for screening mammogram for malignant neoplasm of breast: Secondary | ICD-10-CM

## 2019-02-10 ENCOUNTER — Other Ambulatory Visit: Payer: Self-pay

## 2019-02-10 ENCOUNTER — Ambulatory Visit
Admission: RE | Admit: 2019-02-10 | Discharge: 2019-02-10 | Disposition: A | Discharge status: Home / Self Care | Payer: 59 | Source: Ambulatory Visit | Attending: Obstetrics and Gynecology | Admitting: Obstetrics and Gynecology

## 2019-02-10 DIAGNOSIS — Z1231 Encounter for screening mammogram for malignant neoplasm of breast: Secondary | ICD-10-CM

## 2019-02-16 ENCOUNTER — Ambulatory Visit: Payer: 59 | Attending: Internal Medicine

## 2019-02-16 DIAGNOSIS — Z23 Encounter for immunization: Secondary | ICD-10-CM | POA: Diagnosis present

## 2019-02-16 NOTE — Progress Notes (Signed)
   Covid-19 Vaccination Clinic  Name:  Leslie Crawford    MRN: [id number] DOB: 1950/04/21  02/16/2019  Ms. [name] was observed post Covid-19 immunization for 15 minutes without incidence. She was provided with Vaccine Information Sheet and instruction to access the V-Safe system.   Ms. Felt was instructed to call 911 with any severe reactions post vaccine: Marland Kitchen Difficulty breathing  . Swelling of your face and throat  . A fast heartbeat  . A bad rash all over your body  . Dizziness and weakness    Immunizations Administered    Name Date Dose VIS Date Route   Pfizer COVID-19 Vaccine 02/16/2019  5:22 PM 0.3 mL 01/06/2019 Intramuscular   Manufacturer: [place]   Lot: [id number]   [place]: [id number]

## 2019-02-27 DIAGNOSIS — G43909 Migraine, unspecified, not intractable, without status migrainosus: Secondary | ICD-10-CM | POA: Insufficient documentation

## 2019-03-09 ENCOUNTER — Ambulatory Visit: Payer: 59 | Attending: Internal Medicine

## 2019-03-09 DIAGNOSIS — Z23 Encounter for immunization: Secondary | ICD-10-CM | POA: Insufficient documentation

## 2019-03-09 NOTE — Progress Notes (Signed)
   Covid-19 Vaccination Clinic  Name:  Ala Lathon    MRN: [id number] DOB: [birth year]  03/09/2019  Ms. Fleegle was observed post Covid-19 immunization for 15 minutes without incidence. She was provided with Vaccine Information Sheet and instruction to access the V-Safe system.   Ms. Barret was instructed to call 911 with any severe reactions post vaccine: Marland Kitchen Difficulty breathing  . Swelling of your face and throat  . A fast heartbeat  . A bad rash all over your body  . Dizziness and weakness    Immunizations Administered    Name Date Dose VIS Date Route   Pfizer COVID-19 Vaccine 03/09/2019  5:06 PM 0.3 mL 01/06/2019 Intramuscular   Manufacturer: [place]   Lot: [id number]   [place]: [id number]

## 2019-03-29 ENCOUNTER — Ambulatory Visit (INDEPENDENT_AMBULATORY_CARE_PROVIDER_SITE_OTHER): Payer: 59 | Admitting: Podiatry

## 2019-03-29 ENCOUNTER — Ambulatory Visit (INDEPENDENT_AMBULATORY_CARE_PROVIDER_SITE_OTHER): Payer: 59

## 2019-03-29 ENCOUNTER — Telehealth: Payer: Self-pay | Admitting: Podiatry

## 2019-03-29 ENCOUNTER — Encounter: Payer: Self-pay | Admitting: Podiatry

## 2019-03-29 ENCOUNTER — Other Ambulatory Visit: Payer: Self-pay

## 2019-03-29 VITALS — BP 118/79 | HR 64 | Temp 97.3°F | Resp 16

## 2019-03-29 DIAGNOSIS — M2011 Hallux valgus (acquired), right foot: Secondary | ICD-10-CM

## 2019-03-29 DIAGNOSIS — M2012 Hallux valgus (acquired), left foot: Secondary | ICD-10-CM

## 2019-03-29 NOTE — Progress Notes (Signed)
Subjective:   Patient ID: Leslie Crawford, female   DOB: 69 y.o.   MRN: [id number]   HPI Patient presents with bunion deformity left foot with mild on the right foot and states it is been getting gradually more sore and making shoe gear difficult.  Patient states she is tried wider shoes soaks and oral anti-inflammatories without relief and patient does not smoke likes to be active   ROS      Objective:  Physical Exam Vitals and nursing note reviewed.  Constitutional:      Appearance: She is well-developed.  Pulmonary:     Effort: Pulmonary effort is normal.  Musculoskeletal:        General: Normal range of motion.  Skin:    General: Skin is warm.  Neurological:     Mental Status: She is alert.     Neurovascular status found to be intact muscle strength was found to be adequate range of motion within normal limits.  Patient is noted to have hyperostosis medial aspect first metatarsal head left over right with redness and pain around the surface and irritation of what appears to be the nerve against the bone structure.  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  I explained procedure risk and patient wants surgery and I advised her on distal osteotomy and educated her on this.  She will reappoint 1 week tentatively scheduled for surgery 3 weeks  X-rays indicate that there is deformity with structural elevation of the intermetatarsal angle left over right foot with a roughness around the medial side first metatarsal head

## 2019-03-29 NOTE — Patient Instructions (Addendum)
Bunion  A bunion is a bump on the base of the big toe that forms when the bones of the big toe joint move out of position. Bunions may be small at first, but they often get larger over time. They can make walking painful. What are the causes? A bunion may be caused by:  Wearing narrow or pointed shoes that force the big toe to press against the other toes.  Abnormal foot development that causes the foot to roll inward (pronate).  Changes in the foot that are caused by certain diseases, such as rheumatoid arthritis or polio.  A foot injury. What increases the risk? The following factors may make you more likely to develop this condition:  Wearing shoes that squeeze the toes together.  Having certain diseases, such as: ? Rheumatoid arthritis. ? Polio. ? Cerebral palsy.  Having family members who have bunions.  Being born with a foot deformity, such as flat feet or low arches.  Doing activities that put a lot of pressure on the feet, such as ballet dancing. What are the signs or symptoms? The main symptom of a bunion is a noticeable bump on the big toe. Other symptoms may include:  Pain.  Swelling around the big toe.  Redness and inflammation.  Thick or hardened skin on the big toe or between the toes.  Stiffness or loss of motion in the big toe.  Trouble with walking. How is this diagnosed? A bunion may be diagnosed based on your symptoms, medical history, and activities. You may have tests, such as:  X-rays. These allow your health care provider to check the position of the bones in your foot and look for damage to your joint. They also help your health care provider determine the severity of your bunion and the best way to treat it.  Joint aspiration. In this test, a sample of fluid is removed from the toe joint. This test may be done if you are in a lot of pain. It helps rule out diseases that cause painful swelling of the joints, such as arthritis. How is this  treated? Treatment depends on the severity of your symptoms. The goal of treatment is to relieve symptoms and prevent the bunion from getting worse. Your health care provider may recommend:  Wearing shoes that have a wide toe box.  Using bunion pads to cushion the affected area.  Taping your toes together to keep them in a normal position.  Placing a device inside your shoe (orthotics) to help reduce pressure on your toe joint.  Taking medicine to ease pain, inflammation, and swelling.  Applying heat or ice to the affected area.  Doing stretching exercises.  Surgery to remove scar tissue and move the toes back into their normal position. This treatment is rare. Follow these instructions at home: Managing pain, stiffness, and swelling   If directed, put ice on the painful area: ? Put ice in a plastic bag. ? Place a towel between your skin and the bag. ? Leave the ice on for 20 minutes, 2-3 times a day. Activity   If directed, apply heat to the affected area before you exercise. Use the heat source that your health care provider recommends, such as a moist heat pack or a heating pad. ? Place a towel between your skin and the heat source. ? Leave the heat on for 20-30 minutes. ? Remove the heat if your skin turns bright red. This is especially important if you are unable to feel pain,   heat, or cold. You may have a greater risk of getting burned.  Do exercises as told by your health care provider. General instructions  Support your toe joint with proper footwear, shoe padding, or taping as told by your health care provider.  Take over-the-counter and prescription medicines only as told by your health care provider.  Keep all follow-up visits as told by your health care provider. This is important. Contact a health care provider if your symptoms:  Get worse.  Do not improve in 2 weeks. Get help right away if you have:  Severe pain and trouble with walking. Summary  A  bunion is a bump on the base of the big toe that forms when the bones of the big toe joint move out of position.  Bunions can make walking painful.  Treatment depends on the severity of your symptoms.  Support your toe joint with proper footwear, shoe padding, or taping as told by your health care provider. This information is not intended to replace advice given to you by your health care provider. Make sure you discuss any questions you have with your health care provider. Document Revised: 07/19/2017 Document Reviewed: 05/25/2017 Elsevier Patient Education  2020 Leslie Crawford, you have decided to take an important step towards improving your quality of life.  You can be assured that the doctors and staff at [facility] will be with you every step of the way.  Here are some important things you should know:  5. Plan to be at the surgery center/hospital at least 1 (one) hour prior to your scheduled time, unless otherwise directed by the surgical center/hospital staff.  You must have a responsible adult accompany you, remain during the surgery and drive you home.  Make sure you have directions to the surgical center/hospital to ensure you arrive on time. 6. If you are having surgery at [facility] or [facility], you will need a copy of your medical history and physical form from your family physician within one month prior to the date of surgery. We will give you a form for your primary physician to complete.  7. We make every effort to accommodate the date you request for surgery.  However, there are times where surgery dates or times have to be moved.  We will contact you as soon as possible if a change in schedule is required.   8. No aspirin/ibuprofen for one week before surgery.  If you are on aspirin, any non-steroidal anti-inflammatory medications (Mobic, Aleve, Ibuprofen) should not be taken seven (7) days prior to your surgery.   You make take Tylenol for pain prior to surgery.  9. Medications - If you are taking daily heart and blood pressure medications, seizure, reflux, allergy, asthma, anxiety, pain or diabetes medications, make sure you notify the surgery center/hospital before the day of surgery so they can tell you which medications you should take or avoid the day of surgery. 10. No food or drink after midnight the night before surgery unless directed otherwise by surgical center/hospital staff. 11. No alcoholic beverages [id number] prior to surgery.  No smoking 24-hours prior or 24-hours after surgery. 12. Wear loose pants or shorts. They should be loose enough to fit over bandages, boots, and casts. 13. Don't wear slip-on shoes. Sneakers are preferred. 56. Bring your boot with you to the surgery center/hospital.  Also bring crutches or a walker if your physician has prescribed it for you.  If you do  not have this equipment, it will be provided for you after surgery. 15. If you have not been contacted by the surgery center/hospital by the day before your surgery, call to confirm the date and time of your surgery. 54. Leave-time from work may vary depending on the type of surgery you have.  Appropriate arrangements should be made prior to surgery with your employer. 17. Prescriptions will be provided immediately following surgery by your doctor.  Fill these as soon as possible after surgery and take the medication as directed. Pain medications will not be refilled on weekends and must be approved by the doctor. 18. Remove nail polish on the operative foot and avoid getting pedicures prior to surgery. 84. Wash the night before surgery.  The night before surgery wash the foot and leg well with water and the antibacterial soap provided. Be sure to pay special attention to beneath the toenails and in between the toes.  Wash for at least three (3) minutes. Rinse thoroughly with water and dry well with a towel.  Perform this wash  unless told not to do so by your physician.  Enclosed: 1 Ice pack (please put in freezer the night before surgery)   1 Hibiclens skin cleaner   Pre-op instructions  If you have any questions regarding the instructions, please do not hesitate to call our office.  [place]: [address] [place], [place] [address] -- [place]: [address], [place], [place] [address] -- [phone]  [place]: [place] [address], [place], [place] [address] -- [phone]   Website: [email or web address]

## 2019-03-29 NOTE — Progress Notes (Signed)
   Subjective:    Patient ID: Leslie Crawford, female    DOB: 01/14/1951, 69 y.o.   MRN: [id number]  HPI    Review of Systems  All other systems reviewed and are negative.      Objective:   Physical Exam        Assessment & Plan:

## 2019-03-29 NOTE — Telephone Encounter (Signed)
DOS: 04/18/2019  SURGICAL PROCEDURE: [facility] [place] [employer]([id number])  [employer] Effective 06/27/2018 -   Deductible: $3,375 with $0 met and $3,375 remains. Out of Pocket: $6,750 with $0 met and $6,750 remains. CoInsurance: 80% / 20% Automated Call Ref# [id number]  Per Cristie Hem V no prior authorization is required. Call ref# [id number].

## 2019-04-04 ENCOUNTER — Telehealth: Payer: Self-pay

## 2019-04-04 NOTE — Telephone Encounter (Signed)
Pt stated that she will be having surgery on 04/18/19 with Dr. Paulla Dolly. Pt is has an appointment on 04/06/19. She is not for sure, she will be able to make it. Pt has a few question and would like to be called back.

## 2019-04-04 NOTE — Telephone Encounter (Signed)
Left voicemail letting pt know the appt for 4pm on 03/11 is for a one week follow up and to sign her consent forms and go over her sx in more detail that is scheduled for 03/23. Told pt to call with any other questions/concerns.

## 2019-04-06 ENCOUNTER — Encounter: Payer: Self-pay | Admitting: Podiatry

## 2019-04-06 ENCOUNTER — Ambulatory Visit (INDEPENDENT_AMBULATORY_CARE_PROVIDER_SITE_OTHER): Payer: 59 | Admitting: Podiatry

## 2019-04-06 ENCOUNTER — Other Ambulatory Visit: Payer: Self-pay

## 2019-04-06 VITALS — Temp 97.8°F

## 2019-04-06 DIAGNOSIS — M2011 Hallux valgus (acquired), right foot: Secondary | ICD-10-CM | POA: Diagnosis not present

## 2019-04-06 DIAGNOSIS — M2012 Hallux valgus (acquired), left foot: Secondary | ICD-10-CM

## 2019-04-06 NOTE — Patient Instructions (Signed)
Pre-Operative Instructions  Congratulations, you have decided to take an important step towards improving your quality of life.  You can be assured that the doctors and staff at [facility] will be with you every step of the way.  Here are some important things you should know:  1. Plan to be at the surgery center/hospital at least 1 (one) hour prior to your scheduled time, unless otherwise directed by the surgical center/hospital staff.  You must have a responsible adult accompany you, remain during the surgery and drive you home.  Make sure you have directions to the surgical center/hospital to ensure you arrive on time. 2. If you are having surgery at [facility] or [facility], you will need a copy of your medical history and physical form from your family physician within one month prior to the date of surgery. We will give you a form for your primary physician to complete.  3. We make every effort to accommodate the date you request for surgery.  However, there are times where surgery dates or times have to be moved.  We will contact you as soon as possible if a change in schedule is required.   4. No aspirin/ibuprofen for one week before surgery.  If you are on aspirin, any non-steroidal anti-inflammatory medications (Mobic, Aleve, Ibuprofen) should not be taken seven (7) days prior to your surgery.  You make take Tylenol for pain prior to surgery.  5. Medications - If you are taking daily heart and blood pressure medications, seizure, reflux, allergy, asthma, anxiety, pain or diabetes medications, make sure you notify the surgery center/hospital before the day of surgery so they can tell you which medications you should take or avoid the day of surgery. 6. No food or drink after midnight the night before surgery unless directed otherwise by surgical center/hospital staff. 7. No alcoholic beverages 24-hours prior to surgery.  No smoking 24-hours prior or 24-hours after  surgery. 8. Wear loose pants or shorts. They should be loose enough to fit over bandages, boots, and casts. 9. Don't wear slip-on shoes. Sneakers are preferred. 10. Bring your boot with you to the surgery center/hospital.  Also bring crutches or a walker if your physician has prescribed it for you.  If you do not have this equipment, it will be provided for you after surgery. 11. If you have not been contacted by the surgery center/hospital by the day before your surgery, call to confirm the date and time of your surgery. 12. Leave-time from work may vary depending on the type of surgery you have.  Appropriate arrangements should be made prior to surgery with your employer. 13. Prescriptions will be provided immediately following surgery by your doctor.  Fill these as soon as possible after surgery and take the medication as directed. Pain medications will not be refilled on weekends and must be approved by the doctor. 14. Remove nail polish on the operative foot and avoid getting pedicures prior to surgery. 15. Wash the night before surgery.  The night before surgery wash the foot and leg well with water and the antibacterial soap provided. Be sure to pay special attention to beneath the toenails and in between the toes.  Wash for at least three (3) minutes. Rinse thoroughly with water and dry well with a towel.  Perform this wash unless told not to do so by your physician.  Enclosed: 1 Ice pack (please put in freezer the night before surgery)   1 Hibiclens skin cleaner     Pre-op instructions  If you have any questions regarding the instructions, please do not hesitate to call our office.  [place]: [address], [place], [place] [address] -- [phone]  [place]: [address] [place], [place] [address] -- [phone]  [place]: [address], [place], [place] [address] -- [phone]   Website: [email or web address] 

## 2019-04-07 NOTE — Progress Notes (Signed)
Subjective:   Patient ID: Leslie Crawford, female   DOB: 69 y.o.   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  Patient understands no guarantee as far success of surgery and understands all possible complications as outlined in the consent form willing to accept risk.  Today I went ahead and I had her sign consent form and I dispensed her air fracture walker with all instructions on usage and I want her to get used to it and walk with it prior to procedure and I explained total recovery can take 6 months to 1 year.  Patient is scheduled encouraged to call with questions

## 2019-04-17 MED ORDER — OXYCODONE-ACETAMINOPHEN 10-325 MG PO TABS: 1.0000 | ORAL_TABLET | ORAL | 0 refills | Status: DC | PRN

## 2019-04-17 MED ORDER — ONDANSETRON HCL 4 MG PO TABS: 4.0000 mg | ORAL_TABLET | Freq: Three times a day (TID) | ORAL | 0 refills | Status: DC | PRN

## 2019-04-17 NOTE — Addendum Note (Signed)
Addended by: Wallene Huh on: 04/17/2019 05:26 PM   Modules accepted: Orders

## 2019-04-18 ENCOUNTER — Encounter: Payer: Self-pay | Admitting: Podiatry

## 2019-04-18 DIAGNOSIS — M2012 Hallux valgus (acquired), left foot: Secondary | ICD-10-CM | POA: Diagnosis not present

## 2019-04-20 ENCOUNTER — Telehealth: Payer: Self-pay | Admitting: *Deleted

## 2019-04-20 NOTE — Telephone Encounter (Signed)
Called patient at [phone] (cell #) to check to see how they were doing from when they had surgery with Dr. Paulla Dolly on 04/18/19.  DOS 04/18/2019 [facility]  Pt stated, "Pain 1-2/10; I am doing well and I slept great last night. My bandage is fine; has no drainage; I am wearing my boot and elevating my foot. I am taking my pain medicine and that is helping".  Patient will be seen back by Dr. Paulla Dolly on:  Wednesday, April 26, 2019 at 2:15 pm

## 2019-04-21 ENCOUNTER — Other Ambulatory Visit: Payer: Self-pay | Admitting: Family Medicine

## 2019-04-26 ENCOUNTER — Ambulatory Visit (INDEPENDENT_AMBULATORY_CARE_PROVIDER_SITE_OTHER): Payer: 59

## 2019-04-26 ENCOUNTER — Encounter: Payer: Self-pay | Admitting: Podiatry

## 2019-04-26 ENCOUNTER — Other Ambulatory Visit: Payer: Self-pay

## 2019-04-26 ENCOUNTER — Ambulatory Visit (INDEPENDENT_AMBULATORY_CARE_PROVIDER_SITE_OTHER): Payer: 59 | Admitting: Podiatry

## 2019-04-26 ENCOUNTER — Encounter: Payer: Medicare Other | Admitting: Podiatry

## 2019-04-26 VITALS — Temp 97.7°F

## 2019-04-26 DIAGNOSIS — Z9889 Other specified postprocedural states: Secondary | ICD-10-CM

## 2019-04-26 DIAGNOSIS — M21619 Bunion of unspecified foot: Secondary | ICD-10-CM

## 2019-04-26 DIAGNOSIS — M21612 Bunion of left foot: Secondary | ICD-10-CM

## 2019-04-26 NOTE — Progress Notes (Signed)
Subjective:   Patient ID: Leslie Crawford, female   DOB: 69 y.o.   MRN: [id number]   HPI Patient presents stating I am doing good having no pain today and I am very pleased so far with the results   ROS      Objective:  Physical Exam  Neurovascular status intact negative Bevelyn Buckles' sign noted left foot healing well wound edges well coapted hallux in a rectus position slight peaking of the tibial sesamoid but I do not see this is an issue with joint currently congruence     Assessment:  Doing well post osteotomy first metatarsal left     Plan:  H&P x-ray reviewed and reapplied sterile dressing placing the toe in a slightly valgus position.  Gave instructions on continued immobilization elevation compression and range of motion exercises reappoint 3 weeks or earlier if needed  X-rays indicate osteotomies healing well slight peaking tibial sesamoid but should not be a problem with good joint congruence with fixation in place

## 2019-05-17 ENCOUNTER — Ambulatory Visit (INDEPENDENT_AMBULATORY_CARE_PROVIDER_SITE_OTHER): Payer: 59

## 2019-05-17 ENCOUNTER — Encounter: Payer: Self-pay | Admitting: Podiatry

## 2019-05-17 ENCOUNTER — Other Ambulatory Visit: Payer: Self-pay

## 2019-05-17 ENCOUNTER — Ambulatory Visit (INDEPENDENT_AMBULATORY_CARE_PROVIDER_SITE_OTHER): Payer: 59 | Admitting: Podiatry

## 2019-05-17 VITALS — Temp 97.4°F

## 2019-05-17 DIAGNOSIS — M2012 Hallux valgus (acquired), left foot: Secondary | ICD-10-CM

## 2019-05-17 DIAGNOSIS — M2011 Hallux valgus (acquired), right foot: Secondary | ICD-10-CM

## 2019-05-17 NOTE — Progress Notes (Signed)
Subjective:   Patient ID: Leslie Crawford, female   DOB: 69 y.o.   MRN: [id number]   HPI Patient states I am very pleased my foot feels really good with minimal discomfort   ROS      Objective:  Physical Exam  Neurovascular status intact negative Bevelyn Buckles' sign noted wound edges left healing well hallux in rectus position good alignment noted excellent range of motion     Assessment:  Well post osteotomy left     Plan:  X-rays reviewed and H&P done and at this point advised on gradual return to soft shoe and dispensed ankle compression stocking.  Reappoint 4 weeks or earlier if needed  X-rays indicate osteotomies healing well fixation in place good alignment joint congruence

## 2019-05-20 ENCOUNTER — Other Ambulatory Visit: Payer: Self-pay | Admitting: Family Medicine

## 2019-05-24 ENCOUNTER — Encounter: Payer: 59 | Admitting: Podiatry

## 2019-05-26 ENCOUNTER — Other Ambulatory Visit: Payer: Self-pay | Admitting: Family Medicine

## 2019-06-01 ENCOUNTER — Encounter: Payer: 59 | Admitting: Family Medicine

## 2019-06-09 ENCOUNTER — Telehealth: Payer: Self-pay | Admitting: Family Medicine

## 2019-06-09 MED ORDER — AMLODIPINE BESYLATE 5 MG PO TABS: 5.0000 mg | ORAL_TABLET | Freq: Every day | ORAL | 0 refills | Status: DC

## 2019-06-09 MED ORDER — PRAVASTATIN SODIUM 20 MG PO TABS: 20.0000 mg | ORAL_TABLET | Freq: Every day | ORAL | 0 refills | Status: DC

## 2019-06-09 NOTE — Telephone Encounter (Signed)
Sent 30 day supply

## 2019-06-09 NOTE — Telephone Encounter (Signed)
Medication: amLODipine (NORVASC) 5 MG tablet [id number]    pravastatin (PRAVACHOL) 20 MG tablet [id number]    Has the patient contacted their pharmacy? No. (If no, request that the patient contact the pharmacy for the refill.) (If yes, when and what did the pharmacy advise?)  Preferred Pharmacy (with phone number or street name): [id number] - [place], [place] [address]  [place], [place] [address]  Phone:  [phone] Fax:  [phone]  DEA #:  [id number]  Agent: Please be advised that RX refills may take up to 3 business days. We ask that you follow-up with your pharmacy.   REQUESTING A 30 day supply until she can see Doctor in June.

## 2019-06-21 ENCOUNTER — Ambulatory Visit (INDEPENDENT_AMBULATORY_CARE_PROVIDER_SITE_OTHER): Payer: 59 | Admitting: Podiatry

## 2019-06-21 ENCOUNTER — Ambulatory Visit (INDEPENDENT_AMBULATORY_CARE_PROVIDER_SITE_OTHER): Payer: 59

## 2019-06-21 ENCOUNTER — Other Ambulatory Visit: Payer: Self-pay

## 2019-06-21 ENCOUNTER — Encounter: Payer: Self-pay | Admitting: Podiatry

## 2019-06-21 DIAGNOSIS — M2012 Hallux valgus (acquired), left foot: Secondary | ICD-10-CM

## 2019-06-22 NOTE — Progress Notes (Signed)
Subjective:   Patient ID: Leslie Crawford, female   DOB: 69 y.o.   MRN: [id number]   HPI Patient presents stating that she is doing very well with her foot with minimal discomfort and she states that she just gets swelling at the end of the day but overall she is very pleased with procedure   ROS      Objective:  Physical Exam  Neurovascular status intact negative Bevelyn Buckles' sign noted with wound edges well healed first metatarsal left hallux in rectus position good range of motion no crepitus with slight prominence of the proximal pin position     Assessment:  Overall doing well post osteotomy first metatarsal left with good alignment noted with possibility for prominence of the proximal pin     Plan:  H&P performed x-rays reviewed and advised the patient on return to normal gear.  I explained there is slight prominence of the pin and if it becomes irritated that may need to be removed at 1 point in future  X-rays indicate that the osteotomy is healing well good alignment noted joint congruence with possible slight prominence of the proximal pin position

## 2019-07-04 ENCOUNTER — Other Ambulatory Visit: Payer: Self-pay | Admitting: Family Medicine

## 2019-07-06 ENCOUNTER — Other Ambulatory Visit: Payer: Self-pay | Admitting: Family Medicine

## 2019-07-10 NOTE — Progress Notes (Addendum)
[place] [facility] [address] [place]  [place], [place] [address] [phone]  Date:  07/13/2019   Name:  Leslie Crawford   DOB:  12-08-1950   MRN:  [id number]  PCP:  Darreld Mclean, MD    Chief Complaint: Annual Exam   History of Present Illness:  Leslie Crawford is a 69 y.o. very pleasant female patient who presents with the following:  Here today for a CPE Last seen by myself April 2020 She has history of hypertension, hyperlipidemia, breast cancer She is also been seen by neurology for ocular migraine She had breast cancer approximately a decade ago, diagnosed in 2010.  She had surgery, chemo and radiation.  She is not on any oral medication currently.  She does continue regular mammogram  Needs pneumonia booster - pneumovax shingrix and covid complete  Overdue for BW- she is fasting today  mammo UTD Colon 2020 S/p total hyst dexa- done per her GYN  Former smoker- pack years approx 15pack years total, quit 30+ years ago   She plans to retire next month- she is excited about this, she might get a part time job after a while She is working with [employer] right now  She had bunion surgery in March, she is just getting back to her regular walking program for exercise.  She has put on some weight during the pandemic and is working on getting his back on   Patient Active Problem List   Diagnosis Date Noted  . Migraine 02/27/2019  . High blood pressure 10/15/2016  . ADENOCARCINOMA, BREAST 10/26/2008  . ANXIETY 10/26/2008  . HYPERLIPIDEMIA 07/17/2008  . GERD 07/01/2007  . OSTEOPENIA 07/01/2007  . INSOMNIA-SLEEP DISORDER-UNSPEC 07/01/2007    Past Medical History:  Diagnosis Date  . Allergy   . Anemia   . Anxiety   . Breast cancer ([place]) 2010   S/P Chemo/RadTx- Dr. Eston Esters  . Depression   . GERD (gastroesophageal reflux disease)    past hx- not current  . Hyperlipidemia   . Hypertension   .  Osteopenia   . Osteoporosis    improved- no meds-   . Personal history of chemotherapy   . Personal history of radiation therapy     Past Surgical History:  Procedure Laterality Date  . ABDOMINAL HYSTERECTOMY     Total hysterectomy with BSO  . BREAST BIOPSY Left 2019   benign  . BREAST LUMPECTOMY Left 10/29/2008   Negative markers- 1 positive node  . COLONOSCOPY    . POLYPECTOMY    . SEPTOPLASTY    . SHOULDER SURGERY    . TONSILLECTOMY AND ADENOIDECTOMY      Social History   Tobacco Use  . Smoking status: Former Research scientist (life sciences)  . Smokeless tobacco: Never Used  Vaping Use  . Vaping Use: Never used  Substance Use Topics  . Alcohol use: Yes    Alcohol/week: 5.0 standard drinks    Types: 5 Cans of beer per week  . Drug use: No    Family History  Problem Relation Age of Onset  . Cancer Mother        Throat cancer  . Esophageal cancer Mother   . Cancer Father        Stomach cancer  . Stomach cancer Father   . Early death Maternal Grandmother   . Pneumonia Maternal Grandmother   . Heart attack Maternal Grandfather   . Heart attack Paternal Grandfather   .  Colon cancer Neg Hx   . Colon polyps Neg Hx   . Rectal cancer Neg Hx     Allergies  Allergen Reactions  . Topamax [Topiramate] Other (See Comments)    Decreased cognitive function    Medication list has been reviewed and updated.  Current Outpatient Medications on File Prior to Visit  Medication Sig Dispense Refill  . amLODipine (NORVASC) 5 MG tablet TAKE 1 TABLET BY MOUTH EVERY DAY 90 tablet 1  . Cholecalciferol 25 MCG (1000 UT) tablet Take by mouth.    . losartan (COZAAR) 100 MG tablet TAKE 1/2 TAB BY MOUTH EVERY DAY 45 tablet 3  . magnesium 30 MG tablet Take 30 mg by mouth 2 (two) times daily.    . pravastatin (PRAVACHOL) 20 MG tablet Take 1 tablet (20 mg total) by mouth daily. 30 tablet 0  . vitamin B-12 (CYANOCOBALAMIN) 1000 MCG tablet Take 1,000 mcg by mouth daily.    Marland Kitchen FLUoxetine (PROZAC) 10 MG tablet  Take 10 mg by mouth daily. (Patient not taking: Reported on 07/13/2019)     No current facility-administered medications on file prior to visit.    Review of Systems:  As per HPI- otherwise negative.   Physical Examination: Vitals:   07/13/19 0817  BP: 122/78  Pulse: 78  Resp: 16  Temp: (!) 96 F (35.6 C)  SpO2: 97%   Vitals:   07/13/19 0817  Weight: 164 lb (74.4 kg)  Height: 5\' 4"  (1.626 m)   Body mass index is 28.15 kg/m. Ideal Body Weight: Weight in (lb) to have BMI = 25: 145.3  GEN: no acute distress.  Mild overweight, looks well  HEENT: Atraumatic, Normocephalic.  Ears and Nose: No external deformity. CV: RRR, No M/G/R. No JVD. No thrill. No extra heart sounds. PULM: CTA B, no wheezes, crackles, rhonchi. No retractions. No resp. distress. No accessory muscle use. ABD: S, NT, ND, +BS. No rebound. No HSM. EXTR: No c/c/e PSYCH: Normally interactive. Conversant.   Wt Readings from Last 3 Encounters:  07/13/19 164 lb (74.4 kg)  02/25/18 150 lb (68 kg)  02/11/18 157 lb (71.2 kg)    Assessment and Plan: Physical exam  Dyslipidemia - Plan: Lipid panel, pravastatin (PRAVACHOL) 20 MG tablet  Benign essential hypertension - Plan: CBC, Comprehensive metabolic panel, losartan (COZAAR) 100 MG tablet  B12 deficiency - Plan: B12  Screening for diabetes mellitus - Plan: Comprehensive metabolic panel, Hemoglobin A1c  Screening for thyroid disorder - Plan: TSH  Immunization due - Plan: Pneumococcal polysaccharide vaccine 23-valent greater than or equal to 2yo subcutaneous/IM   Here today for routine physical.  Leslie Crawford is overall doing well, has no major concerns right now.  Her husband's health is suffering some right now so she is focused on him.  Labs pending as above.  She plans to work on exercise and on losing a pound she gained during the pandemic.  Given pneumonia booster today.  I will follow up with her labs This visit occurred during the SARS-CoV-2 public health  emergency.  Safety protocols were in place, including screening questions prior to the visit, additional usage of staff PPE, and extensive cleaning of exam room while observing appropriate contact time as indicated for disinfecting solutions.    Signed Lamar Blinks, MD   Received her labs as below, message to patient  Results for orders placed or performed in visit on 07/13/19  CBC  Result Value Ref Range   WBC 6.5 4.0 - 10.5 K/uL   RBC  4.81 3.87 - 5.11 Mil/uL   Platelets 279.0 150 - 400 K/uL   Hemoglobin 14.5 12.0 - 15.0 g/dL   HCT 42.7 36 - 46 %   MCV 88.7 78.0 - 100.0 fl   MCHC 34.0 30.0 - 36.0 g/dL   RDW 13.4 11.5 - 15.5 %  Comprehensive metabolic panel  Result Value Ref Range   Sodium 138 135 - 145 mEq/L   Potassium 4.3 3.5 - 5.1 mEq/L   Chloride 104 96 - 112 mEq/L   CO2 27 19 - 32 mEq/L   Glucose, Bld 89 70 - 99 mg/dL   BUN 18 6 - 23 mg/dL   Creatinine, Ser 0.86 0.40 - 1.20 mg/dL   Total Bilirubin 1.2 0.2 - 1.2 mg/dL   Alkaline Phosphatase 73 39 - 117 U/L   AST 18 0 - 37 U/L   ALT 15 0 - 35 U/L   Total Protein 6.8 6.0 - 8.3 g/dL   Albumin 4.7 3.5 - 5.2 g/dL   GFR 65.25 >60.00 mL/min   Calcium 9.9 8.4 - 10.5 mg/dL  Hemoglobin A1c  Result Value Ref Range   Hgb A1c MFr Bld 5.2 4.6 - 6.5 %  Lipid panel  Result Value Ref Range   Cholesterol 260 (H) 0 - 200 mg/dL   Triglycerides 110.0 0 - 149 mg/dL   HDL 73.50 >39.00 mg/dL   VLDL 22.0 0.0 - 40.0 mg/dL   LDL Cholesterol 164 (H) 0 - 99 mg/dL   Total CHOL/HDL Ratio 4    NonHDL 186.36   TSH  Result Value Ref Range   TSH 2.29 0.35 - 4.50 uIU/mL  B12  Result Value Ref Range   Vitamin B-12 1,052 (H) 211 - 911 pg/mL   The 10-year ASCVD risk score Mikey Bussing DC Jr., et al., 2013) is: 10.6%   Values used to calculate the score:     Age: 74 years     Sex: Female     Is Non-Hispanic African American: No     Diabetic: No     Tobacco smoker: No     Systolic Blood Pressure: 482 mmHg     Is BP treated: Yes     HDL  Cholesterol: 73.5 mg/dL     Total Cholesterol: 260 mg/dL

## 2019-07-10 NOTE — Patient Instructions (Addendum)
It was great to see you again today Take care and I will be in touch with your labs asap   You got your pneumonia booster today.  For allergies, I would suggest trying a nasal steroid spray such as Flonase or Nasacort.  If this does not work, Claritin or Zyrtec may be helpful.  Failing this, we can use a prescription nasal spray called Atrovent for postnasal drainage.  Please let me know if you need this prescription  I am glad to order a bone density scan for you in December  Please plan to see me in about 6 months, take care   Health Maintenance After Age 70 After age 59, you are at a higher risk for certain long-term diseases and infections as well as injuries from falls. Falls are a major cause of broken bones and head injuries in people who are older than age 49. Getting regular preventive care can help to keep you healthy and well. Preventive care includes getting regular testing and making lifestyle changes as recommended by your health care provider. Talk with your health care provider about:  Which screenings and tests you should have. A screening is a test that checks for a disease when you have no symptoms.  A diet and exercise plan that is right for you. What should I know about screenings and tests to prevent falls? Screening and testing are the best ways to find a health problem early. Early diagnosis and treatment give you the best chance of managing medical conditions that are common after age 91. Certain conditions and lifestyle choices may make you more likely to have a fall. Your health care provider may recommend:  Regular vision checks. Poor vision and conditions such as cataracts can make you more likely to have a fall. If you wear glasses, make sure to get your prescription updated if your vision changes.  Medicine review. Work with your health care provider to regularly review all of the medicines you are taking, including over-the-counter medicines. Ask your health  care provider about any side effects that may make you more likely to have a fall. Tell your health care provider if any medicines that you take make you feel dizzy or sleepy.  Osteoporosis screening. Osteoporosis is a condition that causes the bones to get weaker. This can make the bones weak and cause them to break more easily.  Blood pressure screening. Blood pressure changes and medicines to control blood pressure can make you feel dizzy.  Strength and balance checks. Your health care provider may recommend certain tests to check your strength and balance while standing, walking, or changing positions.  Foot health exam. Foot pain and numbness, as well as not wearing proper footwear, can make you more likely to have a fall.  Depression screening. You may be more likely to have a fall if you have a fear of falling, feel emotionally low, or feel unable to do activities that you used to do.  Alcohol use screening. Using too much alcohol can affect your balance and may make you more likely to have a fall. What actions can I take to lower my risk of falls? General instructions  Talk with your health care provider about your risks for falling. Tell your health care provider if: ? You fall. Be sure to tell your health care provider about all falls, even ones that seem minor. ? You feel dizzy, sleepy, or off-balance.  Take over-the-counter and prescription medicines only as told by your health care  provider. These include any supplements.  Eat a healthy diet and maintain a healthy weight. A healthy diet includes low-fat dairy products, low-fat (lean) meats, and fiber from whole grains, beans, and lots of fruits and vegetables. Home safety  Remove any tripping hazards, such as rugs, cords, and clutter.  Install safety equipment such as grab bars in bathrooms and safety rails on stairs.  Keep rooms and walkways well-lit. Activity   Follow a regular exercise program to stay fit. This will  help you maintain your balance. Ask your health care provider what types of exercise are appropriate for you.  If you need a cane or walker, use it as recommended by your health care provider.  Wear supportive shoes that have nonskid soles. Lifestyle  Do not drink alcohol if your health care provider tells you not to drink.  If you drink alcohol, limit how much you have: ? 0-1 drink a day for women. ? 0-2 drinks a day for men.  Be aware of how much alcohol is in your drink. In the [place] one drink equals one typical bottle of beer (12 oz), one-half glass of wine (5 oz), or one shot of hard liquor (1 oz).  Do not use any products that contain nicotine or tobacco, such as cigarettes and e-cigarettes. If you need help quitting, ask your health care provider. Summary  Having a healthy lifestyle and getting preventive care can help to protect your health and wellness after age 57.  Screening and testing are the best way to find a health problem early and help you avoid having a fall. Early diagnosis and treatment give you the best chance for managing medical conditions that are more common for people who are older than age 32.  Falls are a major cause of broken bones and head injuries in people who are older than age 52. Take precautions to prevent a fall at home.  Work with your health care provider to learn what changes you can make to improve your health and wellness and to prevent falls. This information is not intended to replace advice given to you by your health care provider. Make sure you discuss any questions you have with your health care provider. Document Revised: 05/05/2018 Document Reviewed: 11/25/2016 Elsevier Patient Education  2020 [employer].

## 2019-07-13 ENCOUNTER — Encounter: Payer: Self-pay | Admitting: Family Medicine

## 2019-07-13 ENCOUNTER — Other Ambulatory Visit: Payer: Self-pay

## 2019-07-13 ENCOUNTER — Ambulatory Visit (INDEPENDENT_AMBULATORY_CARE_PROVIDER_SITE_OTHER): Payer: No Typology Code available for payment source | Admitting: Family Medicine

## 2019-07-13 VITALS — BP 122/78 | HR 78 | Temp 96.0°F | Resp 16 | Ht 64.0 in | Wt 164.0 lb

## 2019-07-13 DIAGNOSIS — E785 Hyperlipidemia, unspecified: Secondary | ICD-10-CM

## 2019-07-13 DIAGNOSIS — Z1329 Encounter for screening for other suspected endocrine disorder: Secondary | ICD-10-CM | POA: Diagnosis not present

## 2019-07-13 DIAGNOSIS — I10 Essential (primary) hypertension: Secondary | ICD-10-CM

## 2019-07-13 DIAGNOSIS — Z0000 Encounter for general adult medical examination without abnormal findings: Secondary | ICD-10-CM | POA: Diagnosis not present

## 2019-07-13 DIAGNOSIS — Z131 Encounter for screening for diabetes mellitus: Secondary | ICD-10-CM | POA: Diagnosis not present

## 2019-07-13 DIAGNOSIS — Z23 Encounter for immunization: Secondary | ICD-10-CM

## 2019-07-13 DIAGNOSIS — E538 Deficiency of other specified B group vitamins: Secondary | ICD-10-CM

## 2019-07-13 LAB — COMPREHENSIVE METABOLIC PANEL
ALT: 15 U/L (ref 0–35)
AST: 18 U/L (ref 0–37)
Albumin: 4.7 g/dL (ref 3.5–5.2)
Alkaline Phosphatase: 73 U/L (ref 39–117)
BUN: 18 mg/dL (ref 6–23)
CO2: 27 mEq/L (ref 19–32)
Calcium: 9.9 mg/dL (ref 8.4–10.5)
Chloride: 104 mEq/L (ref 96–112)
Creatinine, Ser: 0.86 mg/dL (ref 0.40–1.20)
GFR: 65.25 mL/min (ref >60.00)
Glucose, Bld: 89 mg/dL (ref 70–99)
Potassium: 4.3 mEq/L (ref 3.5–5.1)
Sodium: 138 mEq/L (ref 135–145)
Total Bilirubin: 1.2 mg/dL (ref 0.2–1.2)
Total Protein: 6.8 g/dL (ref 6.0–8.3)

## 2019-07-13 LAB — CBC
HCT: 42.7 % (ref 36.0–46.0)
Hemoglobin: 14.5 g/dL (ref 12.0–15.0)
MCHC: 34 g/dL (ref 30.0–36.0)
MCV: 88.7 fl (ref 78.0–100.0)
Platelets: 279 10*3/uL (ref 150.0–400.0)
RBC: 4.81 Mil/uL (ref 3.87–5.11)
RDW: 13.4 % (ref 11.5–15.5)
WBC: 6.5 10*3/uL (ref 4.0–10.5)

## 2019-07-13 LAB — HEMOGLOBIN A1C: Hgb A1c MFr Bld: 5.2 % (ref 4.6–6.5)

## 2019-07-13 LAB — LIPID PANEL
Cholesterol: 260 mg/dL — ABNORMAL HIGH (ref 0–200)
HDL: 73.5 mg/dL (ref >39.00)
LDL Cholesterol: 164 mg/dL — ABNORMAL HIGH (ref 0–99)
NonHDL: 186.36
Total CHOL/HDL Ratio: 4
Triglycerides: 110 mg/dL (ref 0.0–149.0)
VLDL: 22 mg/dL (ref 0.0–40.0)

## 2019-07-13 LAB — TSH: TSH: 2.29 u[IU]/mL (ref 0.35–4.50)

## 2019-07-13 LAB — VITAMIN B12: Vitamin B-12: 1052 pg/mL — ABNORMAL HIGH (ref 211–911)

## 2019-07-13 MED ORDER — LOSARTAN POTASSIUM 100 MG PO TABS: ORAL_TABLET | ORAL | 3 refills | Status: DC

## 2019-07-13 MED ORDER — PRAVASTATIN SODIUM 20 MG PO TABS: 20.0000 mg | ORAL_TABLET | Freq: Every day | ORAL | 3 refills | Status: DC

## 2019-07-20 MED ORDER — ROSUVASTATIN CALCIUM 20 MG PO TABS: 20.0000 mg | ORAL_TABLET | Freq: Every day | ORAL | 3 refills | Status: DC

## 2019-08-31 DIAGNOSIS — L820 Inflamed seborrheic keratosis: Secondary | ICD-10-CM | POA: Diagnosis not present

## 2019-08-31 DIAGNOSIS — L821 Other seborrheic keratosis: Secondary | ICD-10-CM | POA: Diagnosis not present

## 2019-08-31 DIAGNOSIS — L814 Other melanin hyperpigmentation: Secondary | ICD-10-CM | POA: Diagnosis not present

## 2019-08-31 DIAGNOSIS — D1801 Hemangioma of skin and subcutaneous tissue: Secondary | ICD-10-CM | POA: Diagnosis not present

## 2019-08-31 DIAGNOSIS — L853 Xerosis cutis: Secondary | ICD-10-CM | POA: Diagnosis not present

## 2019-08-31 DIAGNOSIS — X32XXXS Exposure to sunlight, sequela: Secondary | ICD-10-CM | POA: Diagnosis not present

## 2019-11-01 DIAGNOSIS — Z20828 Contact with and (suspected) exposure to other viral communicable diseases: Secondary | ICD-10-CM | POA: Diagnosis not present

## 2019-11-04 DIAGNOSIS — J209 Acute bronchitis, unspecified: Secondary | ICD-10-CM | POA: Diagnosis not present

## 2019-11-04 DIAGNOSIS — R051 Acute cough: Secondary | ICD-10-CM | POA: Diagnosis not present

## 2019-11-04 DIAGNOSIS — B309 Viral conjunctivitis, unspecified: Secondary | ICD-10-CM | POA: Diagnosis not present

## 2019-11-09 ENCOUNTER — Telehealth: Payer: No Typology Code available for payment source | Admitting: Family Medicine

## 2019-11-14 DIAGNOSIS — R69 Illness, unspecified: Secondary | ICD-10-CM | POA: Diagnosis not present

## 2019-12-04 DIAGNOSIS — J209 Acute bronchitis, unspecified: Secondary | ICD-10-CM | POA: Diagnosis not present

## 2019-12-04 DIAGNOSIS — R051 Acute cough: Secondary | ICD-10-CM | POA: Diagnosis not present

## 2019-12-29 ENCOUNTER — Other Ambulatory Visit: Payer: Self-pay | Admitting: Family Medicine

## 2019-12-29 DIAGNOSIS — Z0000 Encounter for general adult medical examination without abnormal findings: Secondary | ICD-10-CM

## 2019-12-31 ENCOUNTER — Other Ambulatory Visit: Payer: Self-pay | Admitting: Family Medicine

## 2020-01-05 DIAGNOSIS — R69 Illness, unspecified: Secondary | ICD-10-CM | POA: Diagnosis not present

## 2020-01-11 DIAGNOSIS — R69 Illness, unspecified: Secondary | ICD-10-CM | POA: Diagnosis not present

## 2020-02-15 ENCOUNTER — Ambulatory Visit: Payer: Self-pay

## 2020-03-28 ENCOUNTER — Ambulatory Visit
Admission: RE | Admit: 2020-03-28 | Discharge: 2020-03-28 | Disposition: A | Discharge status: Home / Self Care | Payer: Medicare HMO | Source: Ambulatory Visit | Attending: Family Medicine | Admitting: Family Medicine

## 2020-03-28 ENCOUNTER — Other Ambulatory Visit: Payer: Self-pay

## 2020-03-28 DIAGNOSIS — Z1231 Encounter for screening mammogram for malignant neoplasm of breast: Secondary | ICD-10-CM | POA: Diagnosis not present

## 2020-03-28 DIAGNOSIS — Z0000 Encounter for general adult medical examination without abnormal findings: Secondary | ICD-10-CM

## 2020-04-04 ENCOUNTER — Telehealth: Payer: Self-pay | Admitting: Family Medicine

## 2020-04-04 DIAGNOSIS — E559 Vitamin D deficiency, unspecified: Secondary | ICD-10-CM

## 2020-04-04 DIAGNOSIS — Z131 Encounter for screening for diabetes mellitus: Secondary | ICD-10-CM

## 2020-04-04 DIAGNOSIS — E538 Deficiency of other specified B group vitamins: Secondary | ICD-10-CM

## 2020-04-04 DIAGNOSIS — Z1329 Encounter for screening for other suspected endocrine disorder: Secondary | ICD-10-CM

## 2020-04-04 DIAGNOSIS — I10 Essential (primary) hypertension: Secondary | ICD-10-CM

## 2020-04-04 DIAGNOSIS — E785 Hyperlipidemia, unspecified: Secondary | ICD-10-CM

## 2020-04-04 NOTE — Telephone Encounter (Signed)
Patient is requesting  CPE labs ordered prior to appointment time on March 28,2022. Patient would like to discuss at appointment.

## 2020-04-04 NOTE — Telephone Encounter (Signed)
Please advise on lab orders. I will get her scheduled for lab appt prior if ok.

## 2020-04-05 NOTE — Telephone Encounter (Signed)
Lab appointment has been scheduled 

## 2020-04-15 ENCOUNTER — Other Ambulatory Visit: Payer: Self-pay

## 2020-04-15 ENCOUNTER — Other Ambulatory Visit (INDEPENDENT_AMBULATORY_CARE_PROVIDER_SITE_OTHER): Payer: Medicare HMO

## 2020-04-15 ENCOUNTER — Encounter: Payer: Self-pay | Admitting: Family Medicine

## 2020-04-15 DIAGNOSIS — Z131 Encounter for screening for diabetes mellitus: Secondary | ICD-10-CM | POA: Diagnosis not present

## 2020-04-15 DIAGNOSIS — E559 Vitamin D deficiency, unspecified: Secondary | ICD-10-CM

## 2020-04-15 DIAGNOSIS — Z1329 Encounter for screening for other suspected endocrine disorder: Secondary | ICD-10-CM | POA: Diagnosis not present

## 2020-04-15 DIAGNOSIS — I10 Essential (primary) hypertension: Secondary | ICD-10-CM

## 2020-04-15 DIAGNOSIS — E538 Deficiency of other specified B group vitamins: Secondary | ICD-10-CM

## 2020-04-15 DIAGNOSIS — E785 Hyperlipidemia, unspecified: Secondary | ICD-10-CM

## 2020-04-15 LAB — COMPREHENSIVE METABOLIC PANEL
ALT: 24 U/L (ref 0–35)
AST: 23 U/L (ref 0–37)
Albumin: 4.8 g/dL (ref 3.5–5.2)
Alkaline Phosphatase: 79 U/L (ref 39–117)
BUN: 16 mg/dL (ref 6–23)
CO2: 29 mEq/L (ref 19–32)
Calcium: 10.5 mg/dL (ref 8.4–10.5)
Chloride: 103 mEq/L (ref 96–112)
Creatinine, Ser: 0.96 mg/dL (ref 0.40–1.20)
GFR: 59.87 mL/min — ABNORMAL LOW (ref >60.00)
Glucose, Bld: 88 mg/dL (ref 70–99)
Potassium: 4.8 mEq/L (ref 3.5–5.1)
Sodium: 140 mEq/L (ref 135–145)
Total Bilirubin: 1.1 mg/dL (ref 0.2–1.2)
Total Protein: 6.7 g/dL (ref 6.0–8.3)

## 2020-04-15 LAB — LIPID PANEL
Cholesterol: 205 mg/dL — ABNORMAL HIGH (ref 0–200)
HDL: 78.5 mg/dL (ref >39.00)
LDL Cholesterol: 103 mg/dL — ABNORMAL HIGH (ref 0–99)
NonHDL: 126.3
Total CHOL/HDL Ratio: 3
Triglycerides: 116 mg/dL (ref 0.0–149.0)
VLDL: 23.2 mg/dL (ref 0.0–40.0)

## 2020-04-15 LAB — HEMOGLOBIN A1C: Hgb A1c MFr Bld: 5.3 % (ref 4.6–6.5)

## 2020-04-15 LAB — CBC
HCT: 40.3 % (ref 36.0–46.0)
Hemoglobin: 14.1 g/dL (ref 12.0–15.0)
MCHC: 35.1 g/dL (ref 30.0–36.0)
MCV: 87.5 fl (ref 78.0–100.0)
Platelets: 280 10*3/uL (ref 150.0–400.0)
RBC: 4.61 Mil/uL (ref 3.87–5.11)
RDW: 13.3 % (ref 11.5–15.5)
WBC: 7.2 10*3/uL (ref 4.0–10.5)

## 2020-04-15 LAB — VITAMIN D 25 HYDROXY (VIT D DEFICIENCY, FRACTURES): VITD: 51 ng/mL (ref 30.00–100.00)

## 2020-04-15 LAB — VITAMIN B12: Vitamin B-12: 1025 pg/mL — ABNORMAL HIGH (ref 211–911)

## 2020-04-15 LAB — TSH: TSH: 4.04 u[IU]/mL (ref 0.35–4.50)

## 2020-04-19 ENCOUNTER — Other Ambulatory Visit: Payer: Self-pay

## 2020-04-19 NOTE — Progress Notes (Signed)
[facility] [place], [place], [place] [address] [phone]  Date:  04/22/2020   Name:  Leslie Crawford   DOB:  1950/10/28   MRN:  [id number]  PCP:  Darreld Mclean, MD    Chief Complaint: Annual Exam   History of Present Illness:  Leslie Crawford is a 70 y.o. very pleasant female patient who presents with the following:  Patient seen today for physical exam.  Last visit with myself June 2021-she had labs done recently which are are listed below  Results for orders placed or performed in visit on 04/15/20  Vitamin B12  Result Value Ref Range   Vitamin B-12 1,025 (H) 211 - 911 pg/mL  VITAMIN D 25 Hydroxy (Vit-D Deficiency, Fractures)  Result Value Ref Range   VITD 51.00 30.00 - 100.00 ng/mL  TSH  Result Value Ref Range   TSH 4.04 0.35 - 4.50 uIU/mL  Lipid panel  Result Value Ref Range   Cholesterol 205 (H) 0 - 200 mg/dL   Triglycerides 116.0 0.0 - 149.0 mg/dL   HDL 78.50 >39.00 mg/dL   VLDL 23.2 0.0 - 40.0 mg/dL   LDL Cholesterol 103 (H) 0 - 99 mg/dL   Total CHOL/HDL Ratio 3    NonHDL 126.30   Hemoglobin A1c  Result Value Ref Range   Hgb A1c MFr Bld 5.3 4.6 - 6.5 %  Comprehensive metabolic panel  Result Value Ref Range   Sodium 140 135 - 145 mEq/L   Potassium 4.8 3.5 - 5.1 mEq/L   Chloride 103 96 - 112 mEq/L   CO2 29 19 - 32 mEq/L   Glucose, Bld 88 70 - 99 mg/dL   BUN 16 6 - 23 mg/dL   Creatinine, Ser 0.96 0.40 - 1.20 mg/dL   Total Bilirubin 1.1 0.2 - 1.2 mg/dL   Alkaline Phosphatase 79 39 - 117 U/L   AST 23 0 - 37 U/L   ALT 24 0 - 35 U/L   Total Protein 6.7 6.0 - 8.3 g/dL   Albumin 4.8 3.5 - 5.2 g/dL   GFR 59.87 (L) >60.00 mL/min   Calcium 10.5 8.4 - 10.5 mg/dL  CBC  Result Value Ref Range   WBC 7.2 4.0 - 10.5 K/uL   RBC 4.61 3.87 - 5.11 Mil/uL   Platelets 280.0 150.0 - 400.0 K/uL   Hemoglobin 14.1 12.0 - 15.0 g/dL   HCT 40.3 36.0 - 46.0  %   MCV 87.5 78.0 - 100.0 fl   MCHC 35.1 30.0 - 36.0 g/dL   RDW 13.3 11.5 - 15.5 %   The 10-year ASCVD risk score Mikey Bussing DC Jr., et al., 2013) is: 10.8%   Values used to calculate the score:     Age: 36 years     Sex: Female     Is Non-Hispanic African American: No     Diabetic: No     Tobacco smoker: No     Systolic Blood Pressure: 322 mmHg     Is BP treated: Yes     HDL Cholesterol: 78.5 mg/dL     Total Cholesterol: 205 mg/dL  She was seen in urgent care a couple times in the fall with bronchitis  She has history of hypertension, hyperlipidemia, breast cancer She is also been seen by neurology for ocular migraine She had breast cancer approximately a decade ago, diagnosed in 2010. She had surgery, chemo and radiation. She is not on any  oral medication currently. 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  She is working with [employer] and plans to volunteer with the cancer center  Her right thumb tends to be numb- the left is less affected Her thumb does not generally hurt unless she does something like open a jar She has noted this for several months but worse the last 2 months  She used a computer a whole lot in her work She is right handed    Patient Active Problem List   Diagnosis Date Noted  . Migraine 02/27/2019  . High blood pressure 10/15/2016  . ADENOCARCINOMA, BREAST 10/26/2008  . ANXIETY 10/26/2008  . HYPERLIPIDEMIA 07/17/2008  . GERD 07/01/2007  . OSTEOPENIA 07/01/2007  . INSOMNIA-SLEEP DISORDER-UNSPEC 07/01/2007    Past Medical  History:  Diagnosis Date  . Allergy   . Anemia   . Anxiety   . Breast cancer ([facility]) 2010   S/P Chemo/RadTx- Dr. Eston Esters  . Depression   . GERD (gastroesophageal reflux disease)    past hx- not current  . Hyperlipidemia   . Hypertension   . Osteopenia   . Osteoporosis    improved- no meds-   . Personal history of chemotherapy   . Personal history of radiation therapy     Past Surgical History:  Procedure Laterality Date  . ABDOMINAL HYSTERECTOMY     Total hysterectomy with BSO  . BREAST BIOPSY Left 2019   benign  . BREAST LUMPECTOMY Left 10/29/2008   Negative markers- 1 positive node  . COLONOSCOPY    . POLYPECTOMY    . SEPTOPLASTY    . SHOULDER SURGERY    . TONSILLECTOMY AND ADENOIDECTOMY      Social History   Tobacco Use  . Smoking status: Former Research scientist (life sciences)  . Smokeless tobacco: Never Used  Vaping Use  . Vaping Use: Never used  Substance Use Topics  . Alcohol use: Yes    Alcohol/week: 5.0 standard drinks    Types: 5 Cans of beer per week  . Drug use: No    Family History  Problem Relation Age of Onset  . Cancer Mother        Throat cancer  . Esophageal cancer Mother   . Cancer Father        Stomach cancer  . Stomach cancer Father   . Early death Maternal Grandmother   . Pneumonia Maternal Grandmother   . Heart attack Maternal Grandfather   . Heart attack Paternal Grandfather   . Colon cancer Neg Hx   . Colon polyps Neg Hx   . Rectal cancer Neg Hx     Allergies  Allergen Reactions  . Topamax [Topiramate] Other (See Comments)    Decreased cognitive function    Medication list has been reviewed and updated.  Current Outpatient Medications on File Prior to Visit  Medication Sig Dispense Refill  . amLODipine (NORVASC) 5 MG tablet Take 1 tablet (5 mg total) by mouth daily. 90 tablet 1  . Cholecalciferol 25 MCG (1000 UT) tablet Take by mouth.    Marland Kitchen  losartan (COZAAR) 100 MG tablet TAKE 1/2 TAB BY MOUTH EVERY DAY 45 tablet 3  . magnesium 30 MG  tablet Take 30 mg by mouth 2 (two) times daily.    . rosuvastatin (CRESTOR) 20 MG tablet Take 1 tablet (20 mg total) by mouth daily. 90 tablet 3  . vitamin B-12 (CYANOCOBALAMIN) 1000 MCG tablet Take 1,000 mcg by mouth daily.     No current facility-administered medications on file prior to visit.    Review of Systems:  As per HPI- otherwise negative.  No CP or SOB   Physical Examination: Vitals:   04/22/20 1005 04/22/20 1019  BP: 122/74   Pulse: (!) 50 62  Resp: 17   Temp: 98.1 F (36.7 C)   SpO2: 99%    Vitals:   04/22/20 1005  Weight: 163 lb (73.9 kg)  Height: 5\' 4"  (1.626 m)   Body mass index is 27.98 kg/m. Ideal Body Weight: Weight in (lb) to have BMI = 25: 145.3  GEN: no acute distress.  Minimal overweight, looks well HEENT: Atraumatic, Normocephalic.   Bilateral TM wnl, oropharynx normal.  PEERL,EOMI.   Ears and Nose: No external deformity. CV: RRR, No M/G/R. No JVD. No thrill. No extra heart sounds. PULM: CTA B, no wheezes, crackles, rhonchi. No retractions. No resp. distress. No accessory muscle use. ABD: S, NT, ND, +BS. No rebound. No HSM. EXTR: No c/c/e PSYCH: Normally interactive. Conversant.  She has some stiffness and hypertrophy of various joints in both hands.  Normal strength of both upper extremities  Assessment and Plan: Physical exam  Dyslipidemia  Screening for diabetes mellitus  Benign essential hypertension  B12 deficiency  Vitamin D deficiency  Estrogen deficiency - Plan: DG Bone Density  Carpal tunnel syndrome of right wrist  Here today for physical exam.  Went over her recent lab work.  Ordered bone density scan, discussed health maintenance.  Encouraged healthy diet and exercise routine Blood pressure under good control Discussed likely carpal tunnel syndrome.  Recommended that she wear a wrist brace which prevents wrist flexion at night and see if this is helpful after a few weeks.  If not showing improvement please contact  me  Follow-up in 6 months This visit occurred during the SARS-CoV-2 public health emergency.  Safety protocols were in place, including screening questions prior to the visit, additional usage of staff PPE, and extensive cleaning of exam room while observing appropriate contact time as indicated for disinfecting solutions.    Signed Lamar Blinks, MD

## 2020-04-19 NOTE — Patient Instructions (Signed)
It was great to see you again today, take care and please see me about 6 months assuming all is well For your carpal tunnel, you might try wearing a wrist brace at night (the type that prevent forward flexion of the wrist) to give your medial nerve a break from compression.  If this is not helping you over the next few weeks let me know voltaren gel can be helpful for pain in your hands and wrists as well  We ordered your bone density today    Health Maintenance After Age 35 After age 103, you are at a higher risk for certain long-term diseases and infections as well as injuries from falls. Falls are a major cause of broken bones and head injuries in people who are older than age 56. Getting regular preventive care can help to keep you healthy and well. Preventive care includes getting regular testing and making lifestyle changes as recommended by your health care provider. Talk with your health care provider about:  Which screenings and tests you should have. A screening is a test that checks for a disease when you have no symptoms.  A diet and exercise plan that is right for you. What should I know about screenings and tests to prevent falls? Screening and testing are the best ways to find a health problem early. Early diagnosis and treatment give you the best chance of managing medical conditions that are common after age 20. Certain conditions and lifestyle choices may make you more likely to have a fall. Your health care provider may recommend:  Regular vision checks. Poor vision and conditions such as cataracts can make you more likely to have a fall. If you wear glasses, make sure to get your prescription updated if your vision changes.  Medicine review. Work with your health care provider to regularly review all of the medicines you are taking, including over-the-counter medicines. Ask your health care provider about any side effects that may make you more likely to have a fall. Tell your  health care provider if any medicines that you take make you feel dizzy or sleepy.  Osteoporosis screening. Osteoporosis is a condition that causes the bones to get weaker. This can make the bones weak and cause them to break more easily.  Blood pressure screening. Blood pressure changes and medicines to control blood pressure can make you feel dizzy.  Strength and balance checks. Your health care provider may recommend certain tests to check your strength and balance while standing, walking, or changing positions.  Foot health exam. Foot pain and numbness, as well as not wearing proper footwear, can make you more likely to have a fall.  Depression screening. You may be more likely to have a fall if you have a fear of falling, feel emotionally low, or feel unable to do activities that you used to do.  Alcohol use screening. Using too much alcohol can affect your balance and may make you more likely to have a fall. What actions can I take to lower my risk of falls? General instructions  Talk with your health care provider about your risks for falling. Tell your health care provider if: ? You fall. Be sure to tell your health care provider about all falls, even ones that seem minor. ? You feel dizzy, sleepy, or off-balance.  Take over-the-counter and prescription medicines only as told by your health care provider. These include any supplements.  Eat a healthy diet and maintain a healthy weight. A healthy diet  includes low-fat dairy products, low-fat (lean) meats, and fiber from whole grains, beans, and lots of fruits and vegetables. Home safety  Remove any tripping hazards, such as rugs, cords, and clutter.  Install safety equipment such as grab bars in bathrooms and safety rails on stairs.  Keep rooms and walkways well-lit. Activity  Follow a regular exercise program to stay fit. This will help you maintain your balance. Ask your health care provider what types of exercise are  appropriate for you.  If you need a cane or walker, use it as recommended by your health care provider.  Wear supportive shoes that have nonskid soles.   Lifestyle  Do not drink alcohol if your health care provider tells you not to drink.  If you drink alcohol, limit how much you have: ? 0-1 drink a day for women. ? 0-2 drinks a day for men.  Be aware of how much alcohol is in your drink. In the [place] one drink equals one typical bottle of beer (12 oz), one-half glass of wine (5 oz), or one shot of hard liquor (1 oz).  Do not use any products that contain nicotine or tobacco, such as cigarettes and e-cigarettes. If you need help quitting, ask your health care provider. Summary  Having a healthy lifestyle and getting preventive care can help to protect your health and wellness after age 49.  Screening and testing are the best way to find a health problem early and help you avoid having a fall. Early diagnosis and treatment give you the best chance for managing medical conditions that are more common for people who are older than age 75.  Falls are a major cause of broken bones and head injuries in people who are older than age 49. Take precautions to prevent a fall at home.  Work with your health care provider to learn what changes you can make to improve your health and wellness and to prevent falls. This information is not intended to replace advice given to you by your health care provider. Make sure you discuss any questions you have with your health care provider. Document Revised: 05/05/2018 Document Reviewed: 11/25/2016 Elsevier Patient Education  2021 [employer].

## 2020-04-22 ENCOUNTER — Encounter: Payer: Self-pay | Admitting: Family Medicine

## 2020-04-22 ENCOUNTER — Ambulatory Visit (INDEPENDENT_AMBULATORY_CARE_PROVIDER_SITE_OTHER): Payer: Medicare HMO | Admitting: Family Medicine

## 2020-04-22 ENCOUNTER — Other Ambulatory Visit: Payer: Self-pay

## 2020-04-22 VITALS — BP 122/74 | HR 62 | Temp 98.1°F | Resp 17 | Ht 64.0 in | Wt 163.0 lb

## 2020-04-22 DIAGNOSIS — G5601 Carpal tunnel syndrome, right upper limb: Secondary | ICD-10-CM

## 2020-04-22 DIAGNOSIS — Z131 Encounter for screening for diabetes mellitus: Secondary | ICD-10-CM | POA: Diagnosis not present

## 2020-04-22 DIAGNOSIS — I10 Essential (primary) hypertension: Secondary | ICD-10-CM

## 2020-04-22 DIAGNOSIS — E2839 Other primary ovarian failure: Secondary | ICD-10-CM

## 2020-04-22 DIAGNOSIS — E559 Vitamin D deficiency, unspecified: Secondary | ICD-10-CM

## 2020-04-22 DIAGNOSIS — Z0000 Encounter for general adult medical examination without abnormal findings: Secondary | ICD-10-CM

## 2020-04-22 DIAGNOSIS — E785 Hyperlipidemia, unspecified: Secondary | ICD-10-CM

## 2020-04-22 DIAGNOSIS — E538 Deficiency of other specified B group vitamins: Secondary | ICD-10-CM | POA: Diagnosis not present

## 2020-04-23 ENCOUNTER — Encounter: Payer: Self-pay | Admitting: Family Medicine

## 2020-04-23 ENCOUNTER — Ambulatory Visit (HOSPITAL_BASED_OUTPATIENT_CLINIC_OR_DEPARTMENT_OTHER)
Admission: RE | Admit: 2020-04-23 | Discharge: 2020-04-23 | Disposition: A | Discharge status: Home / Self Care | Payer: Medicare HMO | Source: Ambulatory Visit | Attending: Family Medicine | Admitting: Family Medicine

## 2020-04-23 DIAGNOSIS — E2839 Other primary ovarian failure: Secondary | ICD-10-CM | POA: Diagnosis not present

## 2020-04-23 DIAGNOSIS — M8589 Other specified disorders of bone density and structure, multiple sites: Secondary | ICD-10-CM | POA: Diagnosis not present

## 2020-04-23 DIAGNOSIS — H524 Presbyopia: Secondary | ICD-10-CM | POA: Diagnosis not present

## 2020-04-23 DIAGNOSIS — M8580 Other specified disorders of bone density and structure, unspecified site: Secondary | ICD-10-CM | POA: Insufficient documentation

## 2020-04-23 DIAGNOSIS — Z780 Asymptomatic menopausal state: Secondary | ICD-10-CM | POA: Diagnosis not present

## 2020-06-28 ENCOUNTER — Other Ambulatory Visit: Payer: Self-pay | Admitting: Family Medicine

## 2020-06-28 DIAGNOSIS — E785 Hyperlipidemia, unspecified: Secondary | ICD-10-CM

## 2020-07-06 NOTE — Progress Notes (Signed)
[facility] [address], [place], [place] [address] [phone]  Date:  07/11/2020   Name:  Leslie Crawford   DOB:  1950/10/10   MRN:  [id number]  PCP:  Darreld Mclean, MD    Chief Complaint: Hip Pain (Bilateral hip pain, bursitis/)   History of Present Illness:  Leslie Crawford is a 70 y.o. very pleasant female patient who presents with the following:  Patient is seen today with concern of hip pain Most recent visit with myself for physical exam in March She has history of hypertension, hyperlipidemia, breast cancer She is also been seen by neurology for ocular migraine She had breast cancer approximately a decade ago, diagnosed in 2010.  She had surgery, chemo and radiation.  She is not on any oral medication currently.  She does continue regular mammogram  She notes pain in both hips- present for a few months and not going away-not necessarily getting worse.  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  She did get a bit of poison ivy on her ankle recently likely due to this activity  Lab Results  Component Value Date   HGBA1C 5.3 04/15/2020    Patient Active Problem List   Diagnosis Date Noted   Osteopenia 04/23/2020   Migraine 02/27/2019   High blood pressure 10/15/2016   ADENOCARCINOMA, BREAST 10/26/2008   ANXIETY 10/26/2008   HYPERLIPIDEMIA 07/17/2008   GERD 07/01/2007   OSTEOPENIA 07/01/2007   INSOMNIA-SLEEP DISORDER-UNSPEC 07/01/2007    Past Medical History:  Diagnosis Date   Allergy    Anemia    Anxiety     Breast cancer ([facility]) 2010   S/P Chemo/RadTx- Dr. Eston Esters   Depression    GERD (gastroesophageal reflux disease)    past hx- not current   Hyperlipidemia    Hypertension    Osteopenia    Osteoporosis    improved- no meds-    Personal history of chemotherapy    Personal history of radiation therapy     Past Surgical History:  Procedure Laterality Date   ABDOMINAL HYSTERECTOMY     Total hysterectomy with BSO   BREAST BIOPSY Left 2019   benign   BREAST LUMPECTOMY Left 10/29/2008   Negative markers- 1 positive node   COLONOSCOPY     POLYPECTOMY     SEPTOPLASTY     SHOULDER SURGERY     TONSILLECTOMY AND ADENOIDECTOMY      Social History   Tobacco Use   Smoking status: Former    Pack years: 0.00   Smokeless tobacco: Never  Vaping Use   Vaping Use: Never used  Substance Use Topics   Alcohol use: Yes    Alcohol/week: 5.0 standard drinks    Types: 5 Cans of beer per week   Drug use: No    Family History  Problem Relation Age of Onset   Cancer Mother        Throat cancer   Esophageal cancer Mother    Cancer  Father        Stomach cancer   Stomach cancer Father    Early death Maternal Grandmother    Pneumonia Maternal Grandmother    Heart attack Maternal Grandfather    Heart attack Paternal Grandfather    Colon cancer Neg Hx    Colon polyps Neg Hx    Rectal cancer Neg Hx     Allergies  Allergen Reactions   Topamax [Topiramate] Other (See Comments)    Decreased cognitive function    Medication list has been reviewed and updated.  Current Outpatient Medications on File Prior to Visit  Medication Sig Dispense Refill   amLODipine (NORVASC) 5 MG tablet TAKE 1 TABLET BY MOUTH EVERY DAY 90 tablet 1   Cholecalciferol 25 MCG (1000 UT) tablet Take by mouth.     losartan (COZAAR) 100 MG tablet TAKE 1/2 TAB BY MOUTH EVERY DAY 45 tablet 3   magnesium 30 MG tablet Take 30 mg by mouth 2 (two) times daily.     rosuvastatin (CRESTOR) 20 MG tablet TAKE 1 TABLET BY  MOUTH EVERY DAY 90 tablet 3   vitamin B-12 (CYANOCOBALAMIN) 1000 MCG tablet Take 1,000 mcg by mouth daily.     No current facility-administered medications on file prior to visit.    Review of Systems:  As per HPI- otherwise negative.   Physical Examination: Vitals:   07/11/20 1049  BP: 126/68  Pulse: 66  Resp: 16  SpO2: 98%   Vitals:   07/11/20 1049  Weight: 163 lb (73.9 kg)  Height: 5\' 4"  (1.626 m)   Body mass index is 27.98 kg/m. Ideal Body Weight: Weight in (lb) to have BMI = 25: 145.3  GEN: no acute distress.  Overweight, looks well HEENT: Atraumatic, Normocephalic.  Ears and Nose: No external deformity. CV: RRR, No M/G/R. No JVD. No thrill. No extra heart sounds. PULM: CTA B, no wheezes, crackles, rhonchi. No retractions. No resp. distress. No accessory muscle use. ABD: S, NT, ND No rebound. No HSM. EXTR: No c/c/e PSYCH: Normally interactive. Conversant.  There is a small amount of poison ivy rash present on her ankles Both hips are tender over the greater trochanter.  Otherwise normal internal and external rotation of hips, normal flexion and rotation.  No redness or swelling is noted  Discussed doing a greater trochanteric bursitis injection with patient.  She has done this in the past, several years ago.  She would like to repeat today in hopes of relieving her symptoms.  We will treat her worst hip which is the left hip first  We discussed risk of infection or bleeding, she would like to proceed Left hip: Most tender area marked with pen, prepped with Betadine and alcohol.  Injected 40 mg of Depo-Medrol and 4 mL of 1% lidocaine into the greater trochanteric bursa.  Patient tolerated procedure well with no immediate complications  Assessment and Plan: Hip pain - Plan: methocarbamol (ROBAXIN) 500 MG tablet  Trochanteric bursitis of left hip - Plan: methylPREDNISolone acetate (DEPO-MEDROL) injection 40 mg  Rhus dermatitis Patient today with hip pain, seems due  to trochanteric bursitis.  She has symptoms bilaterally but most severe on the left.  Treated this with a steroid injection today.  She also is having some difficulty sleeping due to the pain, but anti-inflammatories caused side effects.  I prescribed a low-dose of Robaxin to use at bedtime as needed  As a side effect, the steroid would likely help with her poison ivy.  She can certainly  use topical cortisone and calamine as well as needed  She will contact me if any apparent complications from injection, if needed we can inject her other hip in a few weeks  This visit occurred during the SARS-CoV-2 public health emergency.  Safety protocols were in place, including screening questions prior to the visit, additional usage of staff PPE, and extensive cleaning of exam room while observing appropriate contact time as indicated for disinfecting solutions.   Signed Lamar Blinks, MD

## 2020-07-11 ENCOUNTER — Ambulatory Visit (INDEPENDENT_AMBULATORY_CARE_PROVIDER_SITE_OTHER): Payer: Medicare HMO | Admitting: Family Medicine

## 2020-07-11 ENCOUNTER — Encounter: Payer: Self-pay | Admitting: Family Medicine

## 2020-07-11 ENCOUNTER — Other Ambulatory Visit: Payer: Self-pay

## 2020-07-11 VITALS — BP 126/68 | HR 66 | Resp 16 | Ht 64.0 in | Wt 163.0 lb

## 2020-07-11 DIAGNOSIS — M7062 Trochanteric bursitis, left hip: Secondary | ICD-10-CM | POA: Diagnosis not present

## 2020-07-11 DIAGNOSIS — L255 Unspecified contact dermatitis due to plants, except food: Secondary | ICD-10-CM

## 2020-07-11 DIAGNOSIS — M25559 Pain in unspecified hip: Secondary | ICD-10-CM | POA: Diagnosis not present

## 2020-07-11 MED ORDER — METHOCARBAMOL 500 MG PO TABS: 250.0000 mg | ORAL_TABLET | Freq: Three times a day (TID) | ORAL | 0 refills | Status: DC | PRN

## 2020-07-11 MED ORDER — METHYLPREDNISOLONE ACETATE 40 MG/ML IJ SUSP
40.0000 mg | Freq: Once | INTRAMUSCULAR | Status: AC
  Administered 2020-07-11: 40 mg via INTRA_ARTICULAR

## 2020-07-11 MED ORDER — METHYLPREDNISOLONE ACETATE 40 MG/ML IJ SUSP: 40.0000 mg | Freq: Once | INTRAMUSCULAR | Status: DC

## 2020-07-11 NOTE — Patient Instructions (Signed)
It was good to see you today, we did an injection for trochanteric bursitis.  Assuming this is helpful, we can do the other hip in a few weeks if need be  Risk of infection is very low, but if you have any redness, swelling, heat, or any other concerns please let me know right away  The steroids should incidentally help with the poison ivy on your ankle as well  You can use the Robaxin as needed for pain in your hip, it can make you a bit drowsy

## 2020-08-29 DIAGNOSIS — L821 Other seborrheic keratosis: Secondary | ICD-10-CM | POA: Diagnosis not present

## 2020-08-29 DIAGNOSIS — D1801 Hemangioma of skin and subcutaneous tissue: Secondary | ICD-10-CM | POA: Diagnosis not present

## 2020-08-29 DIAGNOSIS — X32XXXS Exposure to sunlight, sequela: Secondary | ICD-10-CM | POA: Diagnosis not present

## 2020-08-29 DIAGNOSIS — L814 Other melanin hyperpigmentation: Secondary | ICD-10-CM | POA: Diagnosis not present

## 2020-09-25 ENCOUNTER — Other Ambulatory Visit: Payer: Self-pay | Admitting: Family Medicine

## 2020-09-25 DIAGNOSIS — I10 Essential (primary) hypertension: Secondary | ICD-10-CM

## 2020-12-03 ENCOUNTER — Ambulatory Visit (INDEPENDENT_AMBULATORY_CARE_PROVIDER_SITE_OTHER): Payer: Medicare HMO

## 2020-12-03 ENCOUNTER — Other Ambulatory Visit: Payer: Self-pay

## 2020-12-03 VITALS — BP 110/72 | HR 73 | Temp 98.1°F | Resp 17 | Ht 64.0 in | Wt 160.6 lb

## 2020-12-03 DIAGNOSIS — Z0000 Encounter for general adult medical examination without abnormal findings: Secondary | ICD-10-CM | POA: Diagnosis not present

## 2020-12-03 NOTE — Progress Notes (Addendum)
Subjective:   Leslie Crawford is a 70 y.o. female who presents for an Initial Medicare Annual Wellness Visit.   Review of Systems     Cardiac Risk Factors include: advanced age (>66men, >60 women);hypertension;dyslipidemia     Objective:    Today's Vitals   12/03/20 1057  BP: 110/72  Pulse: 73  Resp: 17  Temp: 98.1 F (36.7 C)  TempSrc: Temporal  SpO2: 97%  Weight: 160 lb 9.6 oz (72.8 kg)  Height: 5\' 4"  (1.626 m)   Body mass index is 27.57 kg/m.  Advanced Directives 12/03/2020 06/08/2017  Does Patient Have a Medical Advance Directive? Yes No  Type of Paramedic of [place];Living will -  Copy of [place] in Chart? No - copy requested -    Current Medications (verified) Outpatient Encounter Medications as of 12/03/2020  Medication Sig   amLODipine (NORVASC) 5 MG tablet TAKE 1 TABLET BY MOUTH EVERY DAY   Cholecalciferol 25 MCG (1000 UT) tablet Take by mouth.   losartan (COZAAR) 50 MG tablet TAKE 1 TABLET BY MOUTH EVERY DAY   magnesium 30 MG tablet Take 30 mg by mouth 2 (two) times daily.   rosuvastatin (CRESTOR) 20 MG tablet TAKE 1 TABLET BY MOUTH EVERY DAY   vitamin B-12 (CYANOCOBALAMIN) 1000 MCG tablet Take 1,000 mcg by mouth daily.   methocarbamol (ROBAXIN) 500 MG tablet Take 0.5-1 tablets (250-500 mg total) by mouth every 8 (eight) hours as needed for muscle spasms. (Patient not taking: Reported on 12/03/2020)   No facility-administered encounter medications on file as of 12/03/2020.    Allergies (verified) Topamax [topiramate]   History: Past Medical History:  Diagnosis Date   Allergy    Anemia    Anxiety    Breast cancer ([place]) 2010   S/P Chemo/RadTx- Dr. Eston Esters   Depression    GERD (gastroesophageal reflux disease)    past hx- not current   Hyperlipidemia    Hypertension    Osteopenia    Osteoporosis    improved- no meds-    Personal history of chemotherapy    Personal history of radiation  therapy    Past Surgical History:  Procedure Laterality Date   ABDOMINAL HYSTERECTOMY     Total hysterectomy with BSO   BREAST BIOPSY Left 2019   benign   BREAST LUMPECTOMY Left 10/29/2008   Negative markers- 1 positive node   COLONOSCOPY     POLYPECTOMY     SEPTOPLASTY     SHOULDER SURGERY     TONSILLECTOMY AND ADENOIDECTOMY     Family History  Problem Relation Age of Onset   Cancer Mother        Throat cancer   Esophageal cancer Mother    Cancer Father        Stomach cancer   Stomach cancer Father    Early death Maternal Grandmother    Pneumonia Maternal Grandmother    Heart attack Maternal Grandfather    Heart attack Paternal Grandfather    Colon cancer Neg Hx    Colon polyps Neg Hx    Rectal cancer Neg Hx    Social History   Socioeconomic History   Marital status: Married    Spouse name: Not on file   Number of children: Not on file   Years of education: Not on file   Highest education level: Not on file  Occupational History   Not on file  Tobacco Use   Smoking status: Former  Smokeless tobacco: Never  Vaping Use   Vaping Use: Never used  Substance and Sexual Activity   Alcohol use: Yes    Alcohol/week: 5.0 standard drinks    Types: 5 Cans of beer per week   Drug use: No   Sexual activity: Yes    Birth control/protection: Surgical  Other Topics Concern   Not on file  Social History Narrative   Not on file   Social Determinants of Health   Financial Resource Strain: Low Risk    Difficulty of Paying Living Expenses: Not hard at all  Food Insecurity: No Food Insecurity   Worried About Charity fundraiser in the Last Year: Never true   Ran Out of Food in the Last Year: Never true  Transportation Needs: No Transportation Needs   Lack of Transportation (Medical): No   Lack of Transportation (Non-Medical): No  Physical Activity: Insufficiently Active   Days of Exercise per Week: 4 days   Minutes of Exercise per Session: 30 min  Stress: No Stress  Concern Present   Feeling of Stress : Not at all  Social Connections: Moderately Isolated   Frequency of Communication with Friends and Family: More than three times a week   Frequency of Social Gatherings with Friends and Family: More than three times a week   Attends Religious Services: Never   Marine scientist or Organizations: No   Attends Music therapist: Never   Marital Status: Married    Tobacco Counseling Counseling given: Not Answered   Clinical Intake:  Pre-visit preparation completed: Yes  Pain : No/denies pain     BMI - recorded: 27.57 Nutritional Status: BMI 25 -29 Overweight Nutritional Risks: None Diabetes: No  How often do you need to have someone help you when you read instructions, pamphlets, or other written materials from your doctor or pharmacy?: 1 - Never  Diabetic?No  Interpreter Needed?: No  Information entered by :: Caroleen Hamman LPN   Activities of Daily Living In your present state of health, do you have any difficulty performing the following activities: 12/03/2020  Hearing? N  Vision? N  Difficulty concentrating or making decisions? N  Walking or climbing stairs? N  Dressing or bathing? N  Doing errands, shopping? N  Preparing Food and eating ? N  Using the Toilet? N  In the past six months, have you accidently leaked urine? N  Do you have problems with loss of bowel control? N  Managing your Medications? N  Managing your Finances? N  Housekeeping or managing your Housekeeping? N  Some recent data might be hidden    Patient Care Team: Copland, Gay Filler, MD as PCP - General (Family Medicine)  Indicate any recent Medical Services you may have received from other than [facility] providers in the past year (date may be approximate).     Assessment:   This is a routine wellness examination for Leslie Crawford.  Hearing/Vision screen Hearing Screening - Comments:: No issues Vision Screening - Comments:: Last eye  exam-05/2020-Dr. Sharen Counter  Dietary issues and exercise activities discussed: Current Exercise Habits: Home exercise routine, Type of exercise: walking;stretching, Time (Minutes): 30, Frequency (Times/Week): 4, Weekly Exercise (Minutes/Week): 120, Intensity: Mild, Exercise limited by: None identified   Goals Addressed             This Visit's Progress    Patient Stated       Continue to lose some weight & increase exercise       Depression Screen PHQ  2/9 Scores 12/03/2020 07/11/2020 04/22/2020 05/25/2016 12/14/2013 06/26/2013  PHQ - 2 Score 0 0 0 0 0 0  PHQ- 9 Score - - 1 - - -  Exception Documentation - - - Patient refusal - -    Fall Risk Fall Risk  12/03/2020 07/11/2020 07/13/2019 11/26/2016 05/25/2016  Falls in the past year? 0 0 0 No No  Number falls in past yr: 0 - 0 - -  Injury with Fall? 0 - - - -  Follow up Falls prevention discussed - - - -    FALL RISK PREVENTION PERTAINING TO THE HOME:  Any stairs in or around the home? Yes  If so, are there any without handrails? No  Home free of loose throw rugs in walkways, pet beds, electrical cords, etc? Yes  Adequate lighting in your home to reduce risk of falls? Yes   ASSISTIVE DEVICES UTILIZED TO PREVENT FALLS:  Life alert? No  Use of a cane, walker or w/c? No  Grab bars in the bathroom? No  Shower chair or bench in shower? No  Elevated toilet seat or a handicapped toilet? No   TIMED UP AND GO:  Was the test performed? Yes .  Length of time to ambulate 10 feet: 10 sec.   Gait steady and fast without use of assistive device  Cognitive Function:Normal cognitive status assessed by direct observation by this Nurse Health Advisor. No abnormalities found.          Immunizations Immunization History  Administered Date(s) Administered   Influenza Inj Mdck Quad With Preservative 11/13/2017   Influenza,inj,Quad PF,6+ Mos 10/16/2018   Influenza-Unspecified 11/14/2019, 11/10/2020   PFIZER(Purple Top)SARS-COV-2 Vaccination  02/16/2019, 03/09/2019, 11/14/2019   Pfizer Covid-19 Vaccine Bivalent Booster 69yrs & up 10/24/2020   Pneumococcal Conjugate-13 05/25/2016   Pneumococcal Polysaccharide-23 07/13/2019   Td 07/01/2007   Tdap 04/01/2017   Zoster Recombinat (Shingrix) 05/25/2016, 09/30/2016    TDAP status: Up to date  Flu Vaccine status: Up to date  Pneumococcal vaccine status: Up to date  Covid-19 vaccine status: Completed vaccines  Qualifies for Shingles Vaccine? No   Zostavax completed No   Shingrix Completed?: Yes  Screening Tests Health Maintenance  Topic Date Due   MAMMOGRAM  03/29/2022   TETANUS/TDAP  04/02/2027   COLONOSCOPY (Pts 45-44yrs Insurance coverage will need to be confirmed)  02/26/2028   Pneumonia Vaccine 42+ Years old  Completed   INFLUENZA VACCINE  Completed   DEXA SCAN  Completed   COVID-19 Vaccine  Completed   Hepatitis C Screening  Completed   Zoster Vaccines- Shingrix  Completed   HPV VACCINES  Aged Out    Health Maintenance  There are no preventive care reminders to display for this patient.   Colorectal cancer screening: Type of screening: Colonoscopy. Completed 02/25/2018. Repeat every 10 years  Mammogram status: Completed bilateral 03/28/2020. Repeat every year  Bone Density status: Completed 04/23/2020. Results reflect: Bone density results: OSTEOPENIA. Repeat every 2 years.  Lung Cancer Screening: (Low Dose CT Chest recommended if Age 56-80 years, 30 pack-year currently smoking OR have quit w/in 15years.) does not qualify.    Additional Screening:  Hepatitis C Screening: Completed 05/25/2016  Vision Screening: Recommended annual ophthalmology exams for early detection of glaucoma and other disorders of the eye. Is the patient up to date with their annual eye exam?  Yes  Who is the provider or what is the name of the office in which the patient attends annual eye exams? Dr. Sharen Counter   Dental  Screening: Recommended annual dental exams for proper oral  hygiene  Community Resource Referral / Chronic Care Management: CRR required this visit?  No   CCM required this visit?  No      Plan:     I have personally reviewed and noted the following in the patient's chart:   Medical and social history Use of alcohol, tobacco or illicit drugs  Current medications and supplements including opioid prescriptions. Patient is not currently taking opioid prescriptions. Functional ability and status Nutritional status Physical activity Advanced directives List of other physicians Hospitalizations, surgeries, and ER visits in previous 12 months Vitals Screenings to include cognitive, depression, and falls Referrals and appointments  In addition, I have reviewed and discussed with patient certain preventive protocols, quality metrics, and best practice recommendations. A written personalized care plan for preventive services as well as general preventive health recommendations were provided to patient.   Patient to access on avs.  Marta Antu, LPN   [id number]  Nurse Health Advisor  Nurse Notes: None   Medical screening examination/treatment/procedure(s) were performed by non-physician practitioner and as supervising provider I was immediately available for consultation/collaboration.  I agree with above. Marrian Salvage, FNP

## 2020-12-03 NOTE — Patient Instructions (Signed)
Leslie Crawford , Thank you for taking time to come for your Medicare Wellness Visit. I appreciate your ongoing commitment to your health goals. Please review the following plan we discussed and let me know if I can assist you in the future.   Screening recommendations/referrals: Colonoscopy: Completed 02/25/2018-Due-02/26/2028 Mammogram: Completed 03/28/2020-Due-03/28/2021 Bone Density: Completed 04/23/2020-Due 04/24/2022 Recommended yearly ophthalmology/optometry visit for glaucoma screening and checkup Recommended yearly dental visit for hygiene and checkup  Vaccinations: Influenza vaccine: Up to date Pneumococcal vaccine: Up to date Tdap vaccine: Up to date-Due-04/02/2027 Shingles vaccine: Completed vaccines   Covid-19:Up to date  Advanced directives: Please bring a copy of Living Will and/or Healthcare Power of Attorney for your chart.   Conditions/risks identified: See problem list  Next appointment: Follow up in one year for your annual wellness visit 12/08/2021 @ 10:20am   Preventive Care 65 Years and Older, Female Preventive care refers to lifestyle choices and visits with your health care provider that can promote health and wellness. What does preventive care include? A yearly physical exam. This is also called an annual well check. Dental exams once or twice a year. Routine eye exams. Ask your health care provider how often you should have your eyes checked. Personal lifestyle choices, including: Daily care of your teeth and gums. Regular physical activity. Eating a healthy diet. Avoiding tobacco and drug use. Limiting alcohol use. Practicing safe sex. Taking low-dose aspirin every day. Taking vitamin and mineral supplements as recommended by your health care provider. What happens during an annual well check? The services and screenings done by your health care provider during your annual well check will depend on your age, overall health, lifestyle risk factors, and family  history of disease. Counseling  Your health care provider may ask you questions about your: Alcohol use. Tobacco use. Drug use. Emotional well-being. Home and relationship well-being. Sexual activity. Eating habits. History of falls. Memory and ability to understand (cognition). Work and work Statistician. Reproductive health. Screening  You may have the following tests or measurements: Height, weight, and BMI. Blood pressure. Lipid and cholesterol levels. These may be checked every 5 years, or more frequently if you are over 47 years old. Skin check. Lung cancer screening. You may have this screening every year starting at age 70 if you have a 30-pack-year history of smoking and currently smoke or have quit within the past 15 years. Fecal occult blood test (FOBT) of the stool. You may have this test every year starting at age 30. Flexible sigmoidoscopy or colonoscopy. You may have a sigmoidoscopy every 5 years or a colonoscopy every 10 years starting at age 72. Hepatitis C blood test. Hepatitis B blood test. Sexually transmitted disease (STD) testing. Diabetes screening. This is done by checking your blood sugar (glucose) after you have not eaten for a while (fasting). You may have this done every 1-3 years. Bone density scan. This is done to screen for osteoporosis. You may have this done starting at age 71. Mammogram. This may be done every 1-2 years. Talk to your health care provider about how often you should have regular mammograms. Talk with your health care provider about your test results, treatment options, and if necessary, the need for more tests. Vaccines  Your health care provider may recommend certain vaccines, such as: Influenza vaccine. This is recommended every year. Tetanus, diphtheria, and acellular pertussis (Tdap, Td) vaccine. You may need a Td booster every 10 years. Zoster vaccine. You may need this after age 72. Pneumococcal 13-valent conjugate (  PCV13)  vaccine. One dose is recommended after age 70. Pneumococcal polysaccharide (PPSV23) vaccine. One dose is recommended after age 45. Talk to your health care provider about which screenings and vaccines you need and how often you need them. This information is not intended to replace advice given to you by your health care provider. Make sure you discuss any questions you have with your health care provider. Document Released: 02/08/2015 Document Revised: 10/02/2015 Document Reviewed: 11/13/2014 Elsevier Interactive Patient Education  2017 [place] Prevention in the Home Falls can cause injuries. They can happen to people of all ages. There are many things you can do to make your home safe and to help prevent falls. What can I do on the outside of my home? Regularly fix the edges of walkways and driveways and fix any cracks. Remove anything that might make you trip as you walk through a door, such as a raised step or threshold. Trim any bushes or trees on the path to your home. Use bright outdoor lighting. Clear any walking paths of anything that might make someone trip, such as rocks or tools. Regularly check to see if handrails are loose or broken. Make sure that both sides of any steps have handrails. Any raised decks and porches should have guardrails on the edges. Have any leaves, snow, or ice cleared regularly. Use sand or salt on walking paths during winter. Clean up any spills in your garage right away. This includes oil or grease spills. What can I do in the bathroom? Use night lights. Install grab bars by the toilet and in the tub and shower. Do not use towel bars as grab bars. Use non-skid mats or decals in the tub or shower. If you need to sit down in the shower, use a plastic, non-slip stool. Keep the floor dry. Clean up any water that spills on the floor as soon as it happens. Remove soap buildup in the tub or shower regularly. Attach bath mats securely with  double-sided non-slip rug tape. Do not have throw rugs and other things on the floor that can make you trip. What can I do in the bedroom? Use night lights. Make sure that you have a light by your bed that is easy to reach. Do not use any sheets or blankets that are too big for your bed. They should not hang down onto the floor. Have a firm chair that has side arms. You can use this for support while you get dressed. Do not have throw rugs and other things on the floor that can make you trip. What can I do in the kitchen? Clean up any spills right away. Avoid walking on wet floors. Keep items that you use a lot in easy-to-reach places. If you need to reach something above you, use a strong step stool that has a grab bar. Keep electrical cords out of the way. Do not use floor polish or wax that makes floors slippery. If you must use wax, use non-skid floor wax. Do not have throw rugs and other things on the floor that can make you trip. What can I do with my stairs? Do not leave any items on the stairs. Make sure that there are handrails on both sides of the stairs and use them. Fix handrails that are broken or loose. Make sure that handrails are as long as the stairways. Check any carpeting to make sure that it is firmly attached to the stairs. Fix any carpet that is  loose or worn. Avoid having throw rugs at the top or bottom of the stairs. If you do have throw rugs, attach them to the floor with carpet tape. Make sure that you have a light switch at the top of the stairs and the bottom of the stairs. If you do not have them, ask someone to add them for you. What else can I do to help prevent falls? Wear shoes that: Do not have high heels. Have rubber bottoms. Are comfortable and fit you well. Are closed at the toe. Do not wear sandals. If you use a stepladder: Make sure that it is fully opened. Do not climb a closed stepladder. Make sure that both sides of the stepladder are locked  into place. Ask someone to hold it for you, if possible. Clearly mark and make sure that you can see: Any grab bars or handrails. First and last steps. Where the edge of each step is. Use tools that help you move around (mobility aids) if they are needed. These include: Canes. Walkers. Scooters. Crutches. Turn on the lights when you go into a dark area. Replace any light bulbs as soon as they burn out. Set up your furniture so you have a clear path. Avoid moving your furniture around. If any of your floors are uneven, fix them. If there are any pets around you, be aware of where they are. Review your medicines with your doctor. Some medicines can make you feel dizzy. This can increase your chance of falling. Ask your doctor what other things that you can do to help prevent falls. This information is not intended to replace advice given to you by your health care provider. Make sure you discuss any questions you have with your health care provider. Document Released: 11/08/2008 Document Revised: 06/20/2015 Document Reviewed: 02/16/2014 Elsevier Interactive Patient Education  2017 [employer].

## 2020-12-23 ENCOUNTER — Other Ambulatory Visit: Payer: Self-pay | Admitting: Family Medicine

## 2020-12-27 NOTE — Progress Notes (Signed)
[place] [address], [place],  [address] [phone]  Date:  12/30/2020   Name:  Leslie Crawford   DOB:  10-31-1950   MRN:  [id number]  PCP:  Darreld Mclean, MD    Chief Complaint: Arm Pain (Left arm pain, she says it feels the same as the right when she needed the rotator cuff surguery. ) and ear discomfort (Pt c/o right ear discomfort. She has been using OTC tx- min relief. )   History of Present Illness:  Leslie Crawford is a 71 y.o. very pleasant female patient who presents with the following:  Patient seen today with a couple of concerns, ear pain and arm pain  Most recent visit with myself was in June of this year for concern of hip pain She has history of hypertension, hyperlipidemia, breast cancer She is also been seen by neurology for ocular migraine She had breast cancer approximately a decade ago, diagnosed in 2010.  She had surgery, chemo and radiation.  She is not on any oral medication currently.  She does continue regular mammogram  Flu vaccine, latest COVID booster up-to-date Mammogram up-to-date  Amlodipine 5 Losartan 50 Crestor 20  She has noted left shoulder pain for about 2 months NKI Reminds her of when she had a rotator cuff tear, her right shoulder repair was done in 2019- Dr Durward Fortes She has trouble abducting, internal rotation especially  She does not have pain at night   Also about one month ago she had a right sided earache - she used some OTC drops and it did get better Now the ear just feels stuffy and congested No draiange  Hearing is ok  Patient Active Problem List   Diagnosis Date Noted   Osteopenia 04/23/2020   Migraine 02/27/2019   High blood pressure 10/15/2016   ADENOCARCINOMA, BREAST 10/26/2008   ANXIETY 10/26/2008   HYPERLIPIDEMIA 07/17/2008   GERD 07/01/2007   OSTEOPENIA 07/01/2007   INSOMNIA-SLEEP DISORDER-UNSPEC 07/01/2007    Past  Medical History:  Diagnosis Date   Allergy    Anemia    Anxiety    Breast cancer ([place]) 2010   S/P Chemo/RadTx- Dr. Eston Esters   Depression    GERD (gastroesophageal reflux disease)    past hx- not current   Hyperlipidemia    Hypertension    Osteopenia    Osteoporosis    improved- no meds-    Personal history of chemotherapy    Personal history of radiation therapy     Past Surgical History:  Procedure Laterality Date   ABDOMINAL HYSTERECTOMY     Total hysterectomy with BSO   BREAST BIOPSY Left 2019   benign   BREAST LUMPECTOMY Left 10/29/2008   Negative markers- 1 positive node   COLONOSCOPY     POLYPECTOMY     SEPTOPLASTY     SHOULDER SURGERY     TONSILLECTOMY AND ADENOIDECTOMY      Social History   Tobacco Use   Smoking status: Former   Smokeless tobacco: Never  Vaping Use   Vaping Use: Never used  Substance Use Topics   Alcohol use: Yes    Alcohol/week: 5.0 standard drinks    Types: 5 Cans of beer per week   Drug use: No    Family History  Problem Relation Age of Onset   Cancer Mother        Throat cancer   Esophageal cancer Mother  Cancer Father        Stomach cancer   Stomach cancer Father    Early death Maternal Grandmother    Pneumonia Maternal Grandmother    Heart attack Maternal Grandfather    Heart attack Paternal Grandfather    Colon cancer Neg Hx    Colon polyps Neg Hx    Rectal cancer Neg Hx     Allergies  Allergen Reactions   Topamax [Topiramate] Other (See Comments)    Decreased cognitive function    Medication list has been reviewed and updated.  Current Outpatient Medications on File Prior to Visit  Medication Sig Dispense Refill   amLODipine (NORVASC) 5 MG tablet TAKE 1 TABLET BY MOUTH EVERY DAY 90 tablet 1   Cholecalciferol 25 MCG (1000 UT) tablet Take by mouth.     losartan (COZAAR) 50 MG tablet TAKE 1 TABLET BY MOUTH EVERY DAY 90 tablet 1   magnesium 30 MG tablet Take 30 mg by mouth 2 (two) times daily.      rosuvastatin (CRESTOR) 20 MG tablet TAKE 1 TABLET BY MOUTH EVERY DAY 90 tablet 3   vitamin B-12 (CYANOCOBALAMIN) 1000 MCG tablet Take 1,000 mcg by mouth daily.     No current facility-administered medications on file prior to visit.    Review of Systems:  As per HPI- otherwise negative.   Physical Examination: Vitals:   12/30/20 0857  BP: 132/80  Pulse: 67  Resp: 18  Temp: 98.4 F (36.9 C)  SpO2: 99%   Vitals:   12/30/20 0857  Weight: 160 lb 12.8 oz (72.9 kg)  Height: 5\' 4"  (1.626 m)   Body mass index is 27.6 kg/m. Ideal Body Weight: Weight in (lb) to have BMI = 25: 145.3  GEN: no acute distress.  Looks well, minimal overweight HEENT: Atraumatic, Normocephalic. Bilateral TM wnl, oropharynx normal.  PEERL,EOMI. the right TM and ear canal are quite normal.  Nasal cavity is somewhat congested Ears and Nose: No external deformity. CV: RRR, No M/G/R. No JVD. No thrill. No extra heart sounds. PULM: CTA B, no wheezes, crackles, rhonchi. No retractions. No resp. distress. No accessory muscle use. EXTR: No c/c/e PSYCH: Normally interactive. Conversant.  Left shoulder: She has moderate/significant rotator cuff tendinitis symptoms.  She is able to comfortably flex and abduct the shoulder to about 90 degrees, passively I can get full range of motion and moving around quite slowly.  Her internal and external rotation are limited.  She has pain with Hawkins testing and weakness with empty can  Assessment and Plan: Chronic left shoulder pain - Plan: Ambulatory referral to Orthopedic Surgery  Ear congestion, right  Patient seen today for couple of concerns. Suspect her ear symptoms are due to eustachian tube dysfunction and nasal congestion.  Suggested trying an over-the-counter nasal steroid spray such as Flonase.  She is having rotator cuff tendinitis symptoms on the left, some lack of range of motion and weakness.  Hopefully surgery will not be necessary, but orthopedic consultation  is indicated.  Referred her back to see Dr. Durward Fortes Signed Lamar Blinks, MD

## 2020-12-30 ENCOUNTER — Ambulatory Visit (INDEPENDENT_AMBULATORY_CARE_PROVIDER_SITE_OTHER): Payer: Medicare HMO | Admitting: Family Medicine

## 2020-12-30 VITALS — BP 132/80 | HR 67 | Temp 98.4°F | Resp 18 | Ht 64.0 in | Wt 160.8 lb

## 2020-12-30 DIAGNOSIS — M25512 Pain in left shoulder: Secondary | ICD-10-CM | POA: Diagnosis not present

## 2020-12-30 DIAGNOSIS — H938X1 Other specified disorders of right ear: Secondary | ICD-10-CM | POA: Diagnosis not present

## 2020-12-30 DIAGNOSIS — G8929 Other chronic pain: Secondary | ICD-10-CM | POA: Diagnosis not present

## 2020-12-30 NOTE — Patient Instructions (Signed)
It was good to see you again today- I hope that your sister does as well as possible!    Try using a nasal steroid spray such as flonase for your ear symptoms- let me know if not helpful for you  I will get you referred back to see Dr Durward Fortes re: your shoulder Address: [address], [place], [place] [address] Phone: [phone]

## 2021-01-01 ENCOUNTER — Other Ambulatory Visit: Payer: Self-pay

## 2021-01-01 ENCOUNTER — Ambulatory Visit: Payer: Medicare HMO | Admitting: Orthopaedic Surgery

## 2021-01-01 ENCOUNTER — Ambulatory Visit (INDEPENDENT_AMBULATORY_CARE_PROVIDER_SITE_OTHER): Payer: Medicare HMO

## 2021-01-01 ENCOUNTER — Encounter: Payer: Self-pay | Admitting: Orthopaedic Surgery

## 2021-01-01 DIAGNOSIS — M25512 Pain in left shoulder: Secondary | ICD-10-CM

## 2021-01-01 DIAGNOSIS — G8929 Other chronic pain: Secondary | ICD-10-CM | POA: Diagnosis not present

## 2021-01-01 NOTE — Progress Notes (Signed)
Office Visit Note   Patient: Leslie Crawford           Date of Birth: 08-21-1950           MRN: [id number] Visit Date: 01/01/2021              Requested by: Darreld Mclean, MD [address] [place],  [place] [address] PCP: Darreld Mclean, MD   Assessment & Plan: Visit Diagnoses:  1. Chronic left shoulder pain     Plan: Mrs. Silvernail has a least a 47-month history of left shoulder pain without any history of injury or trauma.  She is having difficulty raising her arm over her head and sleeping on that side.  She is right-handed.  Over 3 years ago she had a rotator cuff tear repair on the right side is doing extremely well.  She is tried over-the-counter medicines without much relief.  No related neck problems or numbness or tingling.  She has tried ice and Advil.  I suspect there is a reasonable chance she has a rotator cuff tear on the left given her history of the same on the right and with her present symptoms.  I would like to obtain an MRI scan and have her return shortly thereafter.  She agrees.  All questions were answered  Follow-Up Instructions: Return MRI scan left shoulder.   Orders:  Orders Placed This Encounter  Procedures   XR Shoulder Left   MR Shoulder Left w/o contrast   No orders of the defined types were placed in this encounter.     Procedures: No procedures performed   Clinical Data: No additional findings.   Subjective: Chief Complaint  Patient presents with   Left Shoulder - Pain  Patient presents today for left shoulder pain. She said that it has been hurting for three months. No known injury. Her shoulder pain is worse with use. She has limited range of motion. She said that it feels like her rotator cuff. She is hoping to get physical therapy. She is right hand dominant. She is taking Advil and icing as needed.  Over 3 years ago had similar pain in her right shoulder with resultant diagnosis of rotator cuff tear  repair that responded nicely to surgical treatment.  HPI  Review of Systems   Objective: Vital Signs: There were no vitals taken for this visit.  Physical Exam Constitutional:      Appearance: She is well-developed.  Pulmonary:     Effort: Pulmonary effort is normal.  Skin:    General: Skin is warm and dry.  Neurological:     Mental Status: She is alert and oriented to person, place, and time.  Psychiatric:        Behavior: Behavior normal.    Ortho Exam left shoulder with full overhead motion but with a circuitous arc of motion.  Positive impingement with internal/external rotation but without crepitation.  Positive empty can testing.  Biceps intact.  Good strength.  No pain at the [facility] joint but with some prominence.  There was some discomfort in the anterior subacromial region.  Specialty Comments:  No specialty comments available.  Imaging: XR Shoulder Left  Result Date: 01/01/2021 Films of the left shoulder obtained in several projections.  The humeral head is centered about the glenoid.  Normal space between the humeral head and the acromion.  There is significant hyper trophy of the inferior acromion which certainly could lead to impingement.  There are  a few cysts in the humeral head greater tuberosity but no ectopic calcification.  Moderate degenerative changes of the [facility] joint    PMFS History: Patient Active Problem List   Diagnosis Date Noted   Pain in left shoulder 01/01/2021   Osteopenia 04/23/2020   Migraine 02/27/2019   High blood pressure 10/15/2016   ADENOCARCINOMA, BREAST 10/26/2008   ANXIETY 10/26/2008   HYPERLIPIDEMIA 07/17/2008   GERD 07/01/2007   OSTEOPENIA 07/01/2007   INSOMNIA-SLEEP DISORDER-UNSPEC 07/01/2007   Past Medical History:  Diagnosis Date   Allergy    Anemia    Anxiety    Breast cancer ([place]) 2010   S/P Chemo/RadTx- Dr. Eston Esters   Depression    GERD (gastroesophageal reflux disease)    past hx- not current   Hyperlipidemia     Hypertension    Osteopenia    Osteoporosis    improved- no meds-    Personal history of chemotherapy    Personal history of radiation therapy     Family History  Problem Relation Age of Onset   Cancer Mother        Throat cancer   Esophageal cancer Mother    Cancer Father        Stomach cancer   Stomach cancer Father    Early death Maternal Grandmother    Pneumonia Maternal Grandmother    Heart attack Maternal Grandfather    Heart attack Paternal Grandfather    Colon cancer Neg Hx    Colon polyps Neg Hx    Rectal cancer Neg Hx     Past Surgical History:  Procedure Laterality Date   ABDOMINAL HYSTERECTOMY     Total hysterectomy with BSO   BREAST BIOPSY Left 2019   benign   BREAST LUMPECTOMY Left 10/29/2008   Negative markers- 1 positive node   COLONOSCOPY     POLYPECTOMY     SEPTOPLASTY     SHOULDER SURGERY     TONSILLECTOMY AND ADENOIDECTOMY     Social History   Occupational History   Not on file  Tobacco Use   Smoking status: Former   Smokeless tobacco: Never  Vaping Use   Vaping Use: Never used  Substance and Sexual Activity   Alcohol use: Yes    Alcohol/week: 5.0 standard drinks    Types: 5 Cans of beer per week   Drug use: No   Sexual activity: Yes    Birth control/protection: Surgical

## 2021-01-14 ENCOUNTER — Other Ambulatory Visit: Payer: Self-pay

## 2021-01-14 ENCOUNTER — Ambulatory Visit
Admission: RE | Admit: 2021-01-14 | Discharge: 2021-01-14 | Disposition: A | Discharge status: Home / Self Care | Payer: Medicare HMO | Source: Ambulatory Visit | Attending: Orthopaedic Surgery | Admitting: Orthopaedic Surgery

## 2021-01-14 DIAGNOSIS — M19012 Primary osteoarthritis, left shoulder: Secondary | ICD-10-CM | POA: Diagnosis not present

## 2021-01-14 DIAGNOSIS — M25512 Pain in left shoulder: Secondary | ICD-10-CM

## 2021-01-14 DIAGNOSIS — M75122 Complete rotator cuff tear or rupture of left shoulder, not specified as traumatic: Secondary | ICD-10-CM | POA: Diagnosis not present

## 2021-01-14 DIAGNOSIS — R600 Localized edema: Secondary | ICD-10-CM | POA: Diagnosis not present

## 2021-01-14 DIAGNOSIS — M25412 Effusion, left shoulder: Secondary | ICD-10-CM | POA: Diagnosis not present

## 2021-01-16 ENCOUNTER — Ambulatory Visit (INDEPENDENT_AMBULATORY_CARE_PROVIDER_SITE_OTHER): Payer: Medicare HMO | Admitting: Orthopaedic Surgery

## 2021-01-16 ENCOUNTER — Encounter: Payer: Self-pay | Admitting: Orthopaedic Surgery

## 2021-01-16 ENCOUNTER — Other Ambulatory Visit: Payer: Self-pay

## 2021-01-16 DIAGNOSIS — M75122 Complete rotator cuff tear or rupture of left shoulder, not specified as traumatic: Secondary | ICD-10-CM

## 2021-01-16 DIAGNOSIS — M25512 Pain in left shoulder: Secondary | ICD-10-CM

## 2021-01-16 DIAGNOSIS — G8929 Other chronic pain: Secondary | ICD-10-CM

## 2021-01-16 NOTE — Progress Notes (Signed)
Office Visit Note   Patient: Leslie Crawford           Date of Birth: [birth year]           MRN: [id number] Visit Date: 01/16/2021              Requested by: Darreld Mclean, MD [address] [place],  [place] [address] PCP: Darreld Mclean, MD   Assessment & Plan: Visit Diagnoses:  1. Chronic left shoulder pain   2. Nontraumatic complete tear of left rotator cuff     Plan: Mrs. Kue had an MRI scan of her left shoulder demonstrating full-thickness near full width tear of the supraspinatus with approximately 2.1 cm of retraction.  The distal infraspinatus tendon was consistent with tendinosis with bursal sided fraying and low-grade tearing of the anterior fibers.  There was moderate subscapularis tendinosis.  The teres minor was intact.  No muscle atrophy.  There was tendinosis and some interstitial tearing of the long head of the biceps tendon as it entered the groove.  Mild arthropathy of the [facility] joint and a small amount of subcu deltoid fluid consistent with bursitis.  There was a very small joint effusion with synovitis and mild chondrosis.  Long discussion regarding all of the above.  I think the best approach would be an arthroscopic SCD DCR and mini open rotator cuff tear repair with biceps tenodesis.  She has had a similar procedure on the right side and would like to proceed.  We discussed the outpatient nature, general anesthetic, nerve block and what she can expect postoperatively.  Would provide pain meds and probably muscle relaxants she has some some muscle spasms on the opposite side.  She like to proceed so we will set that up for her at her convenience in the near future.  Has had prior left breast surgery with lymph node resection but does not have any residual swelling.  Might have some temporary swelling of the left upper extremity postoperatively which we have discussed  Follow-Up Instructions: Return We will schedule rotator cuff tear repair  left shoulder.   Orders:  No orders of the defined types were placed in this encounter.  No orders of the defined types were placed in this encounter.     Procedures: No procedures performed   Clinical Data: No additional findings.   Subjective: Chief Complaint  Patient presents with   Left Shoulder - Pain, Follow-up  No change in symptoms.  Still having it mostly anterior and subacromial left shoulder pain with difficulty raising her arm over her head.  HPI  Review of Systems   Objective: Vital Signs: There were no vitals taken for this visit.  Physical Exam Constitutional:      Appearance: She is well-developed.  Eyes:     Pupils: Pupils are equal, round, and reactive to light.  Pulmonary:     Effort: Pulmonary effort is normal.  Skin:    General: Skin is warm and dry.  Neurological:     Mental Status: She is alert and oriented to person, place, and time.  Psychiatric:        Behavior: Behavior normal.    Ortho Exam awake alert and oriented x3.  Comfortable sitting.  Positive impingement empty can testing with pain along the anterior subacromial region.  Minimal discomfort at the [facility] joint.  Difficulty raising her arm overhead because of pain but I could place it fully overhead she can maintain that position.  Appears have good strength.  No distal edema.  Specialty Comments:  No specialty comments available.  Imaging: No results found.   PMFS History: Patient Active Problem List   Diagnosis Date Noted   Complete tear of left rotator cuff 01/16/2021   Pain in left shoulder 01/01/2021   Osteopenia 04/23/2020   Migraine 02/27/2019   High blood pressure 10/15/2016   ADENOCARCINOMA, BREAST 10/26/2008   ANXIETY 10/26/2008   HYPERLIPIDEMIA 07/17/2008   GERD 07/01/2007   OSTEOPENIA 07/01/2007   INSOMNIA-SLEEP DISORDER-UNSPEC 07/01/2007   Past Medical History:  Diagnosis Date   Allergy    Anemia    Anxiety    Breast cancer ([place]) 2010   S/P  Chemo/RadTx- Dr. Eston Esters   Depression    GERD (gastroesophageal reflux disease)    past hx- not current   Hyperlipidemia    Hypertension    Osteopenia    Osteoporosis    improved- no meds-    Personal history of chemotherapy    Personal history of radiation therapy     Family History  Problem Relation Age of Onset   Cancer Mother        Throat cancer   Esophageal cancer Mother    Cancer Father        Stomach cancer   Stomach cancer Father    Early death Maternal Grandmother    Pneumonia Maternal Grandmother    Heart attack Maternal Grandfather    Heart attack Paternal Grandfather    Colon cancer Neg Hx    Colon polyps Neg Hx    Rectal cancer Neg Hx     Past Surgical History:  Procedure Laterality Date   ABDOMINAL HYSTERECTOMY     Total hysterectomy with BSO   BREAST BIOPSY Left 2019   benign   BREAST LUMPECTOMY Left 10/29/2008   Negative markers- 1 positive node   COLONOSCOPY     POLYPECTOMY     SEPTOPLASTY     SHOULDER SURGERY     TONSILLECTOMY AND ADENOIDECTOMY     Social History   Occupational History   Not on file  Tobacco Use   Smoking status: Former   Smokeless tobacco: Never  Vaping Use   Vaping Use: Never used  Substance and Sexual Activity   Alcohol use: Yes    Alcohol/week: 5.0 standard drinks    Types: 5 Cans of beer per week   Drug use: No   Sexual activity: Yes    Birth control/protection: Surgical     Garald Balding, MD   Note - This record has been created using Editor, commissioning.  Chart creation errors have been sought, but may not always  have been located. Such creation errors do not reflect on  the standard of medical care.

## 2021-02-17 ENCOUNTER — Other Ambulatory Visit: Payer: Self-pay | Admitting: Family Medicine

## 2021-02-17 DIAGNOSIS — Z1231 Encounter for screening mammogram for malignant neoplasm of breast: Secondary | ICD-10-CM

## 2021-02-28 ENCOUNTER — Other Ambulatory Visit: Payer: Self-pay | Admitting: Physician Assistant

## 2021-02-28 ENCOUNTER — Telehealth: Payer: Self-pay | Admitting: Orthopaedic Surgery

## 2021-02-28 MED ORDER — METHOCARBAMOL 500 MG PO TABS: 500.0000 mg | ORAL_TABLET | Freq: Three times a day (TID) | ORAL | 0 refills | Status: DC | PRN

## 2021-02-28 NOTE — Telephone Encounter (Signed)
Called and spoke with patient. Script has been sent and she is aware.

## 2021-02-28 NOTE — Telephone Encounter (Signed)
Called in Robaxin. She just needs to be careful as can make her sleepy

## 2021-02-28 NOTE — Telephone Encounter (Signed)
Patient called asked if she can get a muscle relaxer called into her pharmacy prior to surgery. Patient asked if she can something to relax the muscle in her left shoulder. The number to contact patient is [phone]

## 2021-03-06 ENCOUNTER — Telehealth: Payer: Self-pay | Admitting: Orthopaedic Surgery

## 2021-03-06 ENCOUNTER — Encounter: Payer: Self-pay | Admitting: Orthopaedic Surgery

## 2021-03-06 DIAGNOSIS — M659 Synovitis and tenosynovitis, unspecified: Secondary | ICD-10-CM | POA: Diagnosis not present

## 2021-03-06 DIAGNOSIS — M75122 Complete rotator cuff tear or rupture of left shoulder, not specified as traumatic: Secondary | ICD-10-CM | POA: Diagnosis not present

## 2021-03-06 DIAGNOSIS — X58XXXA Exposure to other specified factors, initial encounter: Secondary | ICD-10-CM | POA: Diagnosis not present

## 2021-03-06 DIAGNOSIS — M7522 Bicipital tendinitis, left shoulder: Secondary | ICD-10-CM | POA: Diagnosis not present

## 2021-03-06 DIAGNOSIS — M67814 Other specified disorders of tendon, left shoulder: Secondary | ICD-10-CM | POA: Diagnosis not present

## 2021-03-06 DIAGNOSIS — S46012A Strain of muscle(s) and tendon(s) of the rotator cuff of left shoulder, initial encounter: Secondary | ICD-10-CM | POA: Diagnosis not present

## 2021-03-06 DIAGNOSIS — M7542 Impingement syndrome of left shoulder: Secondary | ICD-10-CM | POA: Diagnosis not present

## 2021-03-06 DIAGNOSIS — G8918 Other acute postprocedural pain: Secondary | ICD-10-CM | POA: Diagnosis not present

## 2021-03-06 DIAGNOSIS — Y999 Unspecified external cause status: Secondary | ICD-10-CM | POA: Diagnosis not present

## 2021-03-06 MED ORDER — HYDROCODONE-ACETAMINOPHEN 5-325 MG PO TABS
1.0000 | ORAL_TABLET | Freq: Four times a day (QID) | ORAL | 0 refills | Status: DC | PRN
Start: 2021-03-06 — End: 2021-03-07

## 2021-03-06 NOTE — Telephone Encounter (Signed)
Patient's husband Timmothy Sours called advised [facility] pharmacy advised him that the Rx for Oxycodone is not on stock and don't know when if ever they might get it back in stock. Don asked if something else can be called in for his wife? The number to contact Timmothy Sours is [phone]

## 2021-03-07 ENCOUNTER — Other Ambulatory Visit: Payer: Self-pay | Admitting: Physician Assistant

## 2021-03-07 MED ORDER — HYDROCODONE-ACETAMINOPHEN 10-325 MG PO TABS: ORAL_TABLET | ORAL | 0 refills | Status: DC

## 2021-03-07 NOTE — Telephone Encounter (Signed)
Tried to call patient. No answer. Mailbox is full.

## 2021-03-07 NOTE — Telephone Encounter (Signed)
Sent in norco 10 to take 1/2-1 every 6-8 hours prn pain

## 2021-03-18 ENCOUNTER — Encounter: Payer: Self-pay | Admitting: Orthopaedic Surgery

## 2021-03-18 ENCOUNTER — Other Ambulatory Visit: Payer: Self-pay

## 2021-03-18 ENCOUNTER — Ambulatory Visit (INDEPENDENT_AMBULATORY_CARE_PROVIDER_SITE_OTHER): Payer: Medicare HMO | Admitting: Orthopaedic Surgery

## 2021-03-18 DIAGNOSIS — Z9889 Other specified postprocedural states: Secondary | ICD-10-CM

## 2021-03-18 NOTE — Progress Notes (Signed)
Office Visit Note   Patient: Leslie Crawford           Date of Birth: 01-Jun-1950           MRN: [id number] Visit Date: 03/18/2021              Requested by: Darreld Mclean, MD [address] [place],  [place] [address] PCP: Darreld Mclean, MD   Assessment & Plan: Visit Diagnoses: Left shoulder arthroscopy rotator cuff repair  Plan: Patient is 12 days status post left shoulder arthroscopy with rotator cuff repair.  She is doing well she denies fever chills.  She has been immobilized in this morning.  Her biggest complaint is of some neck spasms.  She said this was going on prior to her surgery.  Her incision is healing well we removed the Steri-Strips placed new Steri-Strips.  She may get this wet.  She was instructed in pendulum exercises.  She was provided a prescription for physical therapy to begin next week which will be passive range of motion only for 6 weeks.  Follow-up in 2 weeks.  Follow-Up Instructions: No follow-ups on file.   Orders:  No orders of the defined types were placed in this encounter.  No orders of the defined types were placed in this encounter.     Procedures: No procedures performed   Clinical Data: No additional findings.   Subjective: Chief Complaint  Patient presents with   Left Shoulder - Post-op Follow-up  Patient presents today s/p left shoulder arthroscope SCD DCR and mini open rotator cuff tear repair with biceps tenodesis preformed on 03/06/2021. Patient states that pain is 2/10 however has been having the most pain in her neck. Patient has been using a sling and taking OTC ibuprofen for pain. Pain prescription has been completed that was prescribed after surgery. Ice is being applied to surgical site which has been helping with her pain.     Review of Systems  Constitutional: Negative.     Objective: Vital Signs: There were no vitals taken for this visit.  Physical Exam Constitutional:      Appearance:  Normal appearance.  Neurological:     Mental Status: She is alert.    Ortho Exam Examination of her shoulder Steri-Strips were removed she has no swelling.  Distally her CMS is intact.  No erythema no drainage from the wound wound has well apposed wound edges Specialty Comments:  No specialty comments available.  Imaging: No results found.   PMFS History: Patient Active Problem List   Diagnosis Date Noted   Complete tear of left rotator cuff 01/16/2021   Pain in left shoulder 01/01/2021   Osteopenia 04/23/2020   Migraine 02/27/2019   High blood pressure 10/15/2016   ADENOCARCINOMA, BREAST 10/26/2008   ANXIETY 10/26/2008   HYPERLIPIDEMIA 07/17/2008   GERD 07/01/2007   OSTEOPENIA 07/01/2007   INSOMNIA-SLEEP DISORDER-UNSPEC 07/01/2007   Past Medical History:  Diagnosis Date   Allergy    Anemia    Anxiety    Breast cancer ([place]) 2010   S/P Chemo/RadTx- Dr. Eston Esters   Depression    GERD (gastroesophageal reflux disease)    past hx- not current   Hyperlipidemia    Hypertension    Osteopenia    Osteoporosis    improved- no meds-    Personal history of chemotherapy    Personal history of radiation therapy     Family History  Problem Relation Age of Onset  Cancer Mother        Throat cancer   Esophageal cancer Mother    Cancer Father        Stomach cancer   Stomach cancer Father    Early death Maternal Grandmother    Pneumonia Maternal Grandmother    Heart attack Maternal Grandfather    Heart attack Paternal Grandfather    Colon cancer Neg Hx    Colon polyps Neg Hx    Rectal cancer Neg Hx     Past Surgical History:  Procedure Laterality Date   ABDOMINAL HYSTERECTOMY     Total hysterectomy with BSO   BREAST BIOPSY Left 2019   benign   BREAST LUMPECTOMY Left 10/29/2008   Negative markers- 1 positive node   COLONOSCOPY     POLYPECTOMY     SEPTOPLASTY     SHOULDER SURGERY     TONSILLECTOMY AND ADENOIDECTOMY     Social History   Occupational History    Not on file  Tobacco Use   Smoking status: Former   Smokeless tobacco: Never  Vaping Use   Vaping Use: Never used  Substance and Sexual Activity   Alcohol use: Yes    Alcohol/week: 5.0 standard drinks    Types: 5 Cans of beer per week   Drug use: No   Sexual activity: Yes    Birth control/protection: Surgical

## 2021-03-22 ENCOUNTER — Other Ambulatory Visit: Payer: Self-pay | Admitting: Family Medicine

## 2021-03-22 DIAGNOSIS — I10 Essential (primary) hypertension: Secondary | ICD-10-CM

## 2021-03-25 DIAGNOSIS — M25512 Pain in left shoulder: Secondary | ICD-10-CM | POA: Diagnosis not present

## 2021-03-25 DIAGNOSIS — M75102 Unspecified rotator cuff tear or rupture of left shoulder, not specified as traumatic: Secondary | ICD-10-CM | POA: Diagnosis not present

## 2021-03-27 DIAGNOSIS — M75102 Unspecified rotator cuff tear or rupture of left shoulder, not specified as traumatic: Secondary | ICD-10-CM | POA: Diagnosis not present

## 2021-03-27 DIAGNOSIS — M25512 Pain in left shoulder: Secondary | ICD-10-CM | POA: Diagnosis not present

## 2021-04-01 DIAGNOSIS — M25512 Pain in left shoulder: Secondary | ICD-10-CM | POA: Diagnosis not present

## 2021-04-01 DIAGNOSIS — M75102 Unspecified rotator cuff tear or rupture of left shoulder, not specified as traumatic: Secondary | ICD-10-CM | POA: Diagnosis not present

## 2021-04-03 ENCOUNTER — Encounter: Payer: Self-pay | Admitting: Orthopaedic Surgery

## 2021-04-03 ENCOUNTER — Other Ambulatory Visit: Payer: Self-pay

## 2021-04-03 ENCOUNTER — Ambulatory Visit (INDEPENDENT_AMBULATORY_CARE_PROVIDER_SITE_OTHER): Payer: Medicare HMO | Admitting: Orthopaedic Surgery

## 2021-04-03 DIAGNOSIS — Z9889 Other specified postprocedural states: Secondary | ICD-10-CM

## 2021-04-03 NOTE — Progress Notes (Signed)
? ?Office Visit Note ?  ?Patient: Leslie Crawford           ?Date of Birth: Dec 31, 1950           ?MRN: [id number] ?Visit Date: 04/03/2021 ?             ?Requested by: Darreld Mclean, MD ?[facility] [address] ?[address] ?[place],  [place] [address] ?PCP: Darreld Mclean, MD ? ? ?Assessment & Plan: ?Visit Diagnoses: Left shoulder postop rotator cuff repair biceps tenodesis ? ?Plan: Patient is 1 month status post left shoulder arthroscopy, rotator cuff repair, biceps tenodesis.  She has had 3 sessions with therapy and feels she is doing well.  She has no complaints today and denies any fever or chills.  She will continue with therapy she may discontinue her sling around the house but should wear it when she is out and about in public.  She will follow-up in 2 weeks at that point I think we can advance her through physical therapy. ? ?Follow-Up Instructions: No follow-ups on file.  ? ?Orders:  ?No orders of the defined types were placed in this encounter. ? ?No orders of the defined types were placed in this encounter. ? ? ? ? Procedures: ?No procedures performed ? ? ?Clinical Data: ?No additional findings. ? ? ?Subjective: ?Chief Complaint  ?Patient presents with  ? Left Shoulder - Follow-up  ?  Left shoulder arthroscopy 03/06/2021  ?Patient presents today for a two week follow up. She is s/p left shoulder arthroscope SCD DCR and mini open rotator cuff tear repair with biceps tenodesis preformed on 03/06/2021.  She is a month out from surgery. She is going to physical therapy twice weekly. She is wearing her sling. No complaints. ? ? ? ?Review of Systems  ?All other systems reviewed and are negative. ? ? ?Objective: ?Vital Signs: There were no vitals taken for this visit. ? ?Physical Exam ?Patient appears well pleasant to examination ?Ortho Exam ?Examination of her left shoulder well-healed surgical incisions no swelling no erythema no cellulitis distal CMS is intact.  She has passive extension to 175 degrees.  Good  abduction internal/external rotation.  She has no swelling ?Specialty Comments:  ?No specialty comments available. ? ?Imaging: ?No results found. ? ? ?PMFS History: ?Patient Active Problem List  ? Diagnosis Date Noted  ? Complete tear of left rotator cuff 01/16/2021  ? Pain in left shoulder 01/01/2021  ? Osteopenia 04/23/2020  ? Migraine 02/27/2019  ? High blood pressure 10/15/2016  ? ADENOCARCINOMA, BREAST 10/26/2008  ? ANXIETY 10/26/2008  ? HYPERLIPIDEMIA 07/17/2008  ? GERD 07/01/2007  ? OSTEOPENIA 07/01/2007  ? INSOMNIA-SLEEP DISORDER-UNSPEC 07/01/2007  ? ?Past Medical History:  ?Diagnosis Date  ? Allergy   ? Anemia   ? Anxiety   ? Breast cancer ([place]) 2010  ? S/P Chemo/RadTx- Dr. Eston Esters  ? Depression   ? GERD (gastroesophageal reflux disease)   ? past hx- not current  ? Hyperlipidemia   ? Hypertension   ? Osteopenia   ? Osteoporosis   ? improved- no meds-   ? Personal history of chemotherapy   ? Personal history of radiation therapy   ?  ?Family History  ?Problem Relation Age of Onset  ? Cancer Mother   ?     Throat cancer  ? Esophageal cancer Mother   ? Cancer Father   ?     Stomach cancer  ? Stomach cancer Father   ? Early death Maternal Grandmother   ?  Pneumonia Maternal Grandmother   ? Heart attack Maternal Grandfather   ? Heart attack Paternal Grandfather   ? Colon cancer Neg Hx   ? Colon polyps Neg Hx   ? Rectal cancer Neg Hx   ?  ?Past Surgical History:  ?Procedure Laterality Date  ? ABDOMINAL HYSTERECTOMY    ? Total hysterectomy with BSO  ? BREAST BIOPSY Left 2019  ? benign  ? BREAST LUMPECTOMY Left 10/29/2008  ? Negative markers- 1 positive node  ? COLONOSCOPY    ? POLYPECTOMY    ? SEPTOPLASTY    ? SHOULDER SURGERY    ? TONSILLECTOMY AND ADENOIDECTOMY    ? ?Social History  ? ?Occupational History  ? Not on file  ?Tobacco Use  ? Smoking status: Former  ? Smokeless tobacco: Never  ?Vaping Use  ? Vaping Use: Never used  ?Substance and Sexual Activity  ? Alcohol use: Yes  ?  Alcohol/week: 5.0 standard  drinks  ?  Types: 5 Cans of beer per week  ? Drug use: No  ? Sexual activity: Yes  ?  Birth control/protection: Surgical  ? ? ? ? ? ? ?

## 2021-04-04 DIAGNOSIS — M75102 Unspecified rotator cuff tear or rupture of left shoulder, not specified as traumatic: Secondary | ICD-10-CM | POA: Diagnosis not present

## 2021-04-04 DIAGNOSIS — M25512 Pain in left shoulder: Secondary | ICD-10-CM | POA: Diagnosis not present

## 2021-04-07 ENCOUNTER — Ambulatory Visit: Payer: Medicare HMO

## 2021-04-08 DIAGNOSIS — M25512 Pain in left shoulder: Secondary | ICD-10-CM | POA: Diagnosis not present

## 2021-04-08 DIAGNOSIS — M75102 Unspecified rotator cuff tear or rupture of left shoulder, not specified as traumatic: Secondary | ICD-10-CM | POA: Diagnosis not present

## 2021-04-11 DIAGNOSIS — M25512 Pain in left shoulder: Secondary | ICD-10-CM | POA: Diagnosis not present

## 2021-04-11 DIAGNOSIS — M75102 Unspecified rotator cuff tear or rupture of left shoulder, not specified as traumatic: Secondary | ICD-10-CM | POA: Diagnosis not present

## 2021-04-14 DIAGNOSIS — M25512 Pain in left shoulder: Secondary | ICD-10-CM | POA: Diagnosis not present

## 2021-04-14 DIAGNOSIS — M75102 Unspecified rotator cuff tear or rupture of left shoulder, not specified as traumatic: Secondary | ICD-10-CM | POA: Diagnosis not present

## 2021-04-17 ENCOUNTER — Ambulatory Visit (INDEPENDENT_AMBULATORY_CARE_PROVIDER_SITE_OTHER): Payer: Medicare HMO | Admitting: Orthopaedic Surgery

## 2021-04-17 ENCOUNTER — Other Ambulatory Visit: Payer: Self-pay

## 2021-04-17 ENCOUNTER — Encounter: Payer: Self-pay | Admitting: Orthopaedic Surgery

## 2021-04-17 DIAGNOSIS — G8929 Other chronic pain: Secondary | ICD-10-CM

## 2021-04-17 DIAGNOSIS — M25512 Pain in left shoulder: Secondary | ICD-10-CM

## 2021-04-17 NOTE — Progress Notes (Signed)
? ?Office Visit Note ?  ?Patient: Leslie Crawford           ?Date of Birth: 12/10/1950           ?MRN: [id number] ?Visit Date: 04/17/2021 ?             ?Requested by: Darreld Mclean, MD ?[address] ?[address] ?[place],  [place] [address] ?PCP: Darreld Mclean, MD ? ? ?Assessment & Plan: ?Visit Diagnoses: Left shoulder postop ? ?Plan: Patient is 6 weeks status post left shoulder arthroscopy rotator cuff repair.  She is doing very well with therapy.  She has no complaints.  We will allow her to discontinue her sling.  We will advance her therapy to include active range of motion and advance to strengthening as tolerated.  Follow-up which should be for her final visit in 1 month ? ?Follow-Up Instructions: No follow-ups on file.  ? ?Orders:  ?No orders of the defined types were placed in this encounter. ? ?No orders of the defined types were placed in this encounter. ? ? ? ? Procedures: ?No procedures performed ? ? ?Clinical Data: ?No additional findings. ? ? ?Subjective: ?No chief complaint on file. ?Patient presents today for follow up on her left shoulder. She had left shoulder arthroscopy on 03/06/2021. She is now 6 weeks out from surgery. She has been going to physical therapy. She is doing well. No complaints. ? ? ? ?Review of Systems  ?All other systems reviewed and are negative. ? ? ?Objective: ?Vital Signs: There were no vitals taken for this visit. ? ?Physical Exam ?Constitutional:   ?   Appearance: Normal appearance.  ?Neurological:  ?   Mental Status: She is alert.  ? ? ?Ortho Exam ?Examination well-healed surgical scar on the left shoulder.  She has good passive motion to 180 degrees she does have a little pain coming down she has good passive motion behind her back to her belt line.  Distal CMS is intact no redness no erythema no swelling ?Specialty Comments:  ?No specialty comments available. ? ?Imaging: ?No results found. ? ? ?PMFS History: ?Patient Active Problem List  ? Diagnosis Date  Noted  ? Complete tear of left rotator cuff 01/16/2021  ? Pain in left shoulder 01/01/2021  ? Osteopenia 04/23/2020  ? Migraine 02/27/2019  ? High blood pressure 10/15/2016  ? ADENOCARCINOMA, BREAST 10/26/2008  ? ANXIETY 10/26/2008  ? HYPERLIPIDEMIA 07/17/2008  ? GERD 07/01/2007  ? OSTEOPENIA 07/01/2007  ? INSOMNIA-SLEEP DISORDER-UNSPEC 07/01/2007  ? ?Past Medical History:  ?Diagnosis Date  ? Allergy   ? Anemia   ? Anxiety   ? Breast cancer ([place]) 2010  ? S/P Chemo/RadTx- Dr. Eston Esters  ? Depression   ? GERD (gastroesophageal reflux disease)   ? past hx- not current  ? Hyperlipidemia   ? Hypertension   ? Osteopenia   ? Osteoporosis   ? improved- no meds-   ? Personal history of chemotherapy   ? Personal history of radiation therapy   ?  ?Family History  ?Problem Relation Age of Onset  ? Cancer Mother   ?     Throat cancer  ? Esophageal cancer Mother   ? Cancer Father   ?     Stomach cancer  ? Stomach cancer Father   ? Early death Maternal Grandmother   ? Pneumonia Maternal Grandmother   ? Heart attack Maternal Grandfather   ? Heart attack Paternal Grandfather   ? Colon cancer Neg Hx   ?  Colon polyps Neg Hx   ? Rectal cancer Neg Hx   ?  ?Past Surgical History:  ?Procedure Laterality Date  ? ABDOMINAL HYSTERECTOMY    ? Total hysterectomy with BSO  ? BREAST BIOPSY Left 2019  ? benign  ? BREAST LUMPECTOMY Left 10/29/2008  ? Negative markers- 1 positive node  ? COLONOSCOPY    ? POLYPECTOMY    ? SEPTOPLASTY    ? SHOULDER SURGERY    ? TONSILLECTOMY AND ADENOIDECTOMY    ? ?Social History  ? ?Occupational History  ? Not on file  ?Tobacco Use  ? Smoking status: Former  ? Smokeless tobacco: Never  ?Vaping Use  ? Vaping Use: Never used  ?Substance and Sexual Activity  ? Alcohol use: Yes  ?  Alcohol/week: 5.0 standard drinks  ?  Types: 5 Cans of beer per week  ? Drug use: No  ? Sexual activity: Yes  ?  Birth control/protection: Surgical  ? ? ? ? ? ? ?

## 2021-04-18 DIAGNOSIS — M25512 Pain in left shoulder: Secondary | ICD-10-CM | POA: Diagnosis not present

## 2021-04-18 DIAGNOSIS — M75102 Unspecified rotator cuff tear or rupture of left shoulder, not specified as traumatic: Secondary | ICD-10-CM | POA: Diagnosis not present

## 2021-04-22 DIAGNOSIS — M25512 Pain in left shoulder: Secondary | ICD-10-CM | POA: Diagnosis not present

## 2021-04-22 DIAGNOSIS — M75102 Unspecified rotator cuff tear or rupture of left shoulder, not specified as traumatic: Secondary | ICD-10-CM | POA: Diagnosis not present

## 2021-04-24 DIAGNOSIS — M25512 Pain in left shoulder: Secondary | ICD-10-CM | POA: Diagnosis not present

## 2021-04-24 DIAGNOSIS — M75102 Unspecified rotator cuff tear or rupture of left shoulder, not specified as traumatic: Secondary | ICD-10-CM | POA: Diagnosis not present

## 2021-04-28 ENCOUNTER — Telehealth: Payer: Self-pay | Admitting: Orthopaedic Surgery

## 2021-04-28 NOTE — Telephone Encounter (Signed)
Pt called stating a referral for physical therapy was sent to the wrong facility. Pt is asking for referral to be sent to [facility]. Please fax to [phone] to [facility]. Pt phone number is [phone] if any question we may have. ?

## 2021-04-29 DIAGNOSIS — M25512 Pain in left shoulder: Secondary | ICD-10-CM | POA: Diagnosis not present

## 2021-04-29 DIAGNOSIS — M6281 Muscle weakness (generalized): Secondary | ICD-10-CM | POA: Diagnosis not present

## 2021-04-29 DIAGNOSIS — M25612 Stiffness of left shoulder, not elsewhere classified: Secondary | ICD-10-CM | POA: Diagnosis not present

## 2021-04-29 DIAGNOSIS — G8929 Other chronic pain: Secondary | ICD-10-CM | POA: Diagnosis not present

## 2021-04-29 NOTE — Telephone Encounter (Signed)
Referral has been re-faxed.

## 2021-05-01 DIAGNOSIS — M25512 Pain in left shoulder: Secondary | ICD-10-CM | POA: Diagnosis not present

## 2021-05-01 DIAGNOSIS — M6281 Muscle weakness (generalized): Secondary | ICD-10-CM | POA: Diagnosis not present

## 2021-05-01 DIAGNOSIS — M25612 Stiffness of left shoulder, not elsewhere classified: Secondary | ICD-10-CM | POA: Diagnosis not present

## 2021-05-01 DIAGNOSIS — G8929 Other chronic pain: Secondary | ICD-10-CM | POA: Diagnosis not present

## 2021-05-05 DIAGNOSIS — M6281 Muscle weakness (generalized): Secondary | ICD-10-CM | POA: Diagnosis not present

## 2021-05-05 DIAGNOSIS — M25512 Pain in left shoulder: Secondary | ICD-10-CM | POA: Diagnosis not present

## 2021-05-05 DIAGNOSIS — G8929 Other chronic pain: Secondary | ICD-10-CM | POA: Diagnosis not present

## 2021-05-05 DIAGNOSIS — M25612 Stiffness of left shoulder, not elsewhere classified: Secondary | ICD-10-CM | POA: Diagnosis not present

## 2021-05-07 DIAGNOSIS — M25612 Stiffness of left shoulder, not elsewhere classified: Secondary | ICD-10-CM | POA: Diagnosis not present

## 2021-05-07 DIAGNOSIS — M25512 Pain in left shoulder: Secondary | ICD-10-CM | POA: Diagnosis not present

## 2021-05-07 DIAGNOSIS — M6281 Muscle weakness (generalized): Secondary | ICD-10-CM | POA: Diagnosis not present

## 2021-05-07 DIAGNOSIS — G8929 Other chronic pain: Secondary | ICD-10-CM | POA: Diagnosis not present

## 2021-05-12 ENCOUNTER — Ambulatory Visit
Admission: RE | Admit: 2021-05-12 | Discharge: 2021-05-12 | Disposition: A | Discharge status: Home / Self Care | Payer: Medicare HMO | Source: Ambulatory Visit | Attending: Family Medicine | Admitting: Family Medicine

## 2021-05-12 DIAGNOSIS — Z1231 Encounter for screening mammogram for malignant neoplasm of breast: Secondary | ICD-10-CM | POA: Diagnosis not present

## 2021-05-15 DIAGNOSIS — M25512 Pain in left shoulder: Secondary | ICD-10-CM | POA: Diagnosis not present

## 2021-05-15 DIAGNOSIS — M25612 Stiffness of left shoulder, not elsewhere classified: Secondary | ICD-10-CM | POA: Diagnosis not present

## 2021-05-15 DIAGNOSIS — G8929 Other chronic pain: Secondary | ICD-10-CM | POA: Diagnosis not present

## 2021-05-15 DIAGNOSIS — H52223 Regular astigmatism, bilateral: Secondary | ICD-10-CM | POA: Diagnosis not present

## 2021-05-15 DIAGNOSIS — M6281 Muscle weakness (generalized): Secondary | ICD-10-CM | POA: Diagnosis not present

## 2021-05-20 ENCOUNTER — Ambulatory Visit (INDEPENDENT_AMBULATORY_CARE_PROVIDER_SITE_OTHER): Payer: Medicare HMO | Admitting: Orthopaedic Surgery

## 2021-05-20 ENCOUNTER — Encounter: Payer: Self-pay | Admitting: Orthopaedic Surgery

## 2021-05-20 DIAGNOSIS — M6281 Muscle weakness (generalized): Secondary | ICD-10-CM | POA: Diagnosis not present

## 2021-05-20 DIAGNOSIS — G8929 Other chronic pain: Secondary | ICD-10-CM | POA: Diagnosis not present

## 2021-05-20 DIAGNOSIS — M25512 Pain in left shoulder: Secondary | ICD-10-CM

## 2021-05-20 DIAGNOSIS — M25612 Stiffness of left shoulder, not elsewhere classified: Secondary | ICD-10-CM | POA: Diagnosis not present

## 2021-05-20 NOTE — Progress Notes (Addendum)
? ?Office Visit Note ?  ?Patient: Leslie Crawford           ?Date of Birth: 06-16-1950           ?MRN: [id number] ?Visit Date: 05/20/2021 ?             ?Requested by: Darreld Mclean, MD ?[facility] ?[address] ?[place],  [place] [address] ?PCP: Darreld Mclean, MD ? ? ?Assessment & Plan: ?Visit Diagnoses: Left Shoulder Pain ? ?Plan: Patient is 71 years old is now 10 weeks status post left shoulder arthroscopy rotator cuff repair.  She is continuing to work with physical therapy on strengthening.  She is actually doing very well.  She may follow-up as needed.  We discussed with her to avoid high risk situations where she might be prone to retear her rotator cuff.  We also reassured her that we will take several months to fully regain her strength. ? ?Follow-Up Instructions: No follow-ups on file.  ? ?Orders:  ?No orders of the defined types were placed in this encounter. ? ?No orders of the defined types were placed in this encounter. ? ? ? ? Procedures: ?No procedures performed ? ? ?Clinical Data: ?No additional findings. ? ? ?Subjective: ?Chief Complaint  ?Patient presents with  ? Left Shoulder - Follow-up  ?  Left shoulder arthroscopy 03/06/2021  ?Patient presents today for follow up on her left shoulder. She had a left shoulder arthroscopy on 03/06/2021. Patient states that she is doing well. She is still going to physical therapy.  ? ? ? ?Review of Systems  ?Skin: Negative.   ?All other systems reviewed and are negative. ? ? ?Objective: ?Vital Signs: There were no vitals taken for this visit. ? ?Physical Exam patient aware appears well very comfortable to exam ? ?Ortho Exam ?She has a well-healed surgical incision.  No swelling no redness.  She has forward elevation to not quite equal to her unaffected side but within a degree or 2.  She has internal rotation behind her back to her bra strap and notes that she can strap her bra behind her back with her hand.  She has good grip strength distal  sensation is intact strength is just slightly less when compared to the unaffected side ?Specialty Comments:  ?No specialty comments available. ? ?Imaging: ?No results found. ? ? ?PMFS History: ?Patient Active Problem List  ? Diagnosis Date Noted  ? Complete tear of left rotator cuff 01/16/2021  ? Pain in left shoulder 01/01/2021  ? Osteopenia 04/23/2020  ? Migraine 02/27/2019  ? High blood pressure 10/15/2016  ? ADENOCARCINOMA, BREAST 10/26/2008  ? ANXIETY 10/26/2008  ? HYPERLIPIDEMIA 07/17/2008  ? GERD 07/01/2007  ? OSTEOPENIA 07/01/2007  ? INSOMNIA-SLEEP DISORDER-UNSPEC 07/01/2007  ? ?Past Medical History:  ?Diagnosis Date  ? Allergy   ? Anemia   ? Anxiety   ? Breast cancer ([place]) 2010  ? S/P Chemo/RadTx- Dr. Eston Esters  ? Depression   ? GERD (gastroesophageal reflux disease)   ? past hx- not current  ? Hyperlipidemia   ? Hypertension   ? Osteopenia   ? Osteoporosis   ? improved- no meds-   ? Personal history of chemotherapy   ? Personal history of radiation therapy   ?  ?Family History  ?Problem Relation Age of Onset  ? Cancer Mother   ?     Throat cancer  ? Esophageal cancer Mother   ? Cancer Father   ?  Stomach cancer  ? Stomach cancer Father   ? Early death Maternal Grandmother   ? Pneumonia Maternal Grandmother   ? Heart attack Maternal Grandfather   ? Heart attack Paternal Grandfather   ? Colon cancer Neg Hx   ? Colon polyps Neg Hx   ? Rectal cancer Neg Hx   ?  ?Past Surgical History:  ?Procedure Laterality Date  ? ABDOMINAL HYSTERECTOMY    ? Total hysterectomy with BSO  ? BREAST BIOPSY Left 2019  ? benign  ? BREAST LUMPECTOMY Left 10/29/2008  ? Negative markers- 1 positive node  ? COLONOSCOPY    ? POLYPECTOMY    ? SEPTOPLASTY    ? SHOULDER SURGERY    ? TONSILLECTOMY AND ADENOIDECTOMY    ? ?Social History  ? ?Occupational History  ? Not on file  ?Tobacco Use  ? Smoking status: Former  ? Smokeless tobacco: Never  ?Vaping Use  ? Vaping Use: Never used  ?Substance and Sexual Activity  ? Alcohol use: Yes  ?   Alcohol/week: 5.0 standard drinks  ?  Types: 5 Cans of beer per week  ? Drug use: No  ? Sexual activity: Yes  ?  Birth control/protection: Surgical  ? ? ? ? ? ? ?

## 2021-05-22 DIAGNOSIS — M6281 Muscle weakness (generalized): Secondary | ICD-10-CM | POA: Diagnosis not present

## 2021-05-22 DIAGNOSIS — M25512 Pain in left shoulder: Secondary | ICD-10-CM | POA: Diagnosis not present

## 2021-05-22 DIAGNOSIS — M25612 Stiffness of left shoulder, not elsewhere classified: Secondary | ICD-10-CM | POA: Diagnosis not present

## 2021-05-22 DIAGNOSIS — G8929 Other chronic pain: Secondary | ICD-10-CM | POA: Diagnosis not present

## 2021-05-26 ENCOUNTER — Telehealth: Payer: Self-pay | Admitting: Family Medicine

## 2021-05-26 DIAGNOSIS — E785 Hyperlipidemia, unspecified: Secondary | ICD-10-CM

## 2021-05-26 DIAGNOSIS — R5383 Other fatigue: Secondary | ICD-10-CM

## 2021-05-26 DIAGNOSIS — I10 Essential (primary) hypertension: Secondary | ICD-10-CM

## 2021-05-26 DIAGNOSIS — Z131 Encounter for screening for diabetes mellitus: Secondary | ICD-10-CM

## 2021-05-26 DIAGNOSIS — E538 Deficiency of other specified B group vitamins: Secondary | ICD-10-CM

## 2021-05-26 NOTE — Telephone Encounter (Signed)
Patient would like labs before cpe on Monday  ? ?Please advise  ?

## 2021-05-26 NOTE — Telephone Encounter (Signed)
Okay for lab appointment prior? ?

## 2021-05-26 NOTE — Telephone Encounter (Signed)
Pt scheduled  

## 2021-05-27 DIAGNOSIS — M6281 Muscle weakness (generalized): Secondary | ICD-10-CM | POA: Diagnosis not present

## 2021-05-27 DIAGNOSIS — M25612 Stiffness of left shoulder, not elsewhere classified: Secondary | ICD-10-CM | POA: Diagnosis not present

## 2021-05-27 DIAGNOSIS — G8929 Other chronic pain: Secondary | ICD-10-CM | POA: Diagnosis not present

## 2021-05-27 DIAGNOSIS — M25512 Pain in left shoulder: Secondary | ICD-10-CM | POA: Diagnosis not present

## 2021-05-27 NOTE — Patient Instructions (Addendum)
Good to see you today- your labs are all reassuring!   ? ?We will set you up for a coronary calcium score- if you like stop at imaging on the ground floor and schedule today  ? ?Please see me in about 6 months and take are  ?

## 2021-05-27 NOTE — Progress Notes (Signed)
Therapist, music at [facility] ?[address], [address] ?[place], [place] [address] ?336 [phone] ?Fax [phone] ? ?Date:  06/02/2021  ? ?Name:  Leslie Crawford   DOB:  1950-07-06   MRN:  [id number] ? ?PCP:  Darreld Mclean, MD  ? ? ?Chief Complaint: Annual Exam (Concerns/ questions: none/) ? ? ?History of Present Illness: ? ?Leslie Crawford is a 71 y.o. very pleasant female patient who presents with the following: ? ?Pt seen today for a CPE ?Last seen by myself in December  ?She has history of hypertension, hyperlipidemia, breast cancer ?She is also been seen by neurology for ocular migraine ?She had breast cancer over a decade ago, diagnosed in 2010.  She had surgery, chemo and radiation.  She is not on any oral medication currently.  She does continue regular mammogram ? ?She ended up having a left shoulder operation in February per Dr Durward Fortes- this has done well, her ROM is much better but she is still working on her strength  ?This was her 2nd shoulder operation  ? ?Mammogram- UTD ?Colon done in 2020, given a 10 year follow-up  ?Dexa- one year ago  ?Labs are already done- as below, we discussed ? ?She enjoys walking for exercise- she maybe 3 miles up to 5 days a week ?No CP or SOB with exercise  ?S/p hyst ?She is concerned about CAD- several friends have been dx.  She would like to have a coronary calcium score  ?Results for orders placed or performed in visit on 05/29/21  ?Vitamin B12  ?Result Value Ref Range  ? Vitamin B-12 480 211 - 911 pg/mL  ?TSH  ?Result Value Ref Range  ? TSH 3.34 0.35 - 5.50 uIU/mL  ?Lipid panel  ?Result Value Ref Range  ? Cholesterol 220 (H) 0 - 200 mg/dL  ? Triglycerides 74.0 0.0 - 149.0 mg/dL  ? HDL 93.80 >39.00 mg/dL  ? VLDL 14.8 0.0 - 40.0 mg/dL  ? LDL Cholesterol 112 (H) 0 - 99 mg/dL  ? Total CHOL/HDL Ratio 2   ? NonHDL 126.66   ?Hemoglobin A1c  ?Result Value Ref Range  ? Hgb A1c MFr Bld 5.2 4.6 - 6.5 %  ?Comprehensive metabolic panel  ?Result  Value Ref Range  ? Sodium 141 135 - 145 mEq/L  ? Potassium 4.2 3.5 - 5.1 mEq/L  ? Chloride 103 96 - 112 mEq/L  ? CO2 29 19 - 32 mEq/L  ? Glucose, Bld 84 70 - 99 mg/dL  ? BUN 17 6 - 23 mg/dL  ? Creatinine, Ser 0.93 0.40 - 1.20 mg/dL  ? Total Bilirubin 1.1 0.2 - 1.2 mg/dL  ? Alkaline Phosphatase 74 39 - 117 U/L  ? AST 25 0 - 37 U/L  ? ALT 24 0 - 35 U/L  ? Total Protein 6.6 6.0 - 8.3 g/dL  ? Albumin 4.5 3.5 - 5.2 g/dL  ? GFR 61.71 >60.00 mL/min  ? Calcium 10.2 8.4 - 10.5 mg/dL  ?CBC  ?Result Value Ref Range  ? WBC 6.7 4.0 - 10.5 K/uL  ? RBC 4.69 3.87 - 5.11 Mil/uL  ? Platelets 287.0 150.0 - 400.0 K/uL  ? Hemoglobin 14.3 12.0 - 15.0 g/dL  ? HCT 42.6 36.0 - 46.0 %  ? MCV 90.8 78.0 - 100.0 fl  ? MCHC 33.6 30.0 - 36.0 g/dL  ? RDW 13.9 11.5 - 15.5 %  ? ? ? ?Amlodipine ?Losartan ?Crestor ?B12  ? ?The 10-year ASCVD risk score (  Arnett DK, et al., 2019) is: 12.5% ?  Values used to calculate the score: ?    Age: 41 years ?    Sex: Female ?    Is Non-Hispanic African American: No ?    Diabetic: No ?    Tobacco smoker: No ?    Systolic Blood Pressure: 947 mmHg ?    Is BP treated: Yes ?    HDL Cholesterol: 93.8 mg/dL ?    Total Cholesterol: 220 mg/dL ? ? ?Patient Active Problem List  ? Diagnosis Date Noted  ? Complete tear of left rotator cuff 01/16/2021  ? Pain in left shoulder 01/01/2021  ? Osteopenia 04/23/2020  ? Migraine 02/27/2019  ? High blood pressure 10/15/2016  ? ADENOCARCINOMA, BREAST 10/26/2008  ? ANXIETY 10/26/2008  ? HYPERLIPIDEMIA 07/17/2008  ? GERD 07/01/2007  ? OSTEOPENIA 07/01/2007  ? INSOMNIA-SLEEP DISORDER-UNSPEC 07/01/2007  ? ? ?Past Medical History:  ?Diagnosis Date  ? Allergy   ? Anemia   ? Anxiety   ? Breast cancer ([place]) 2010  ? S/P Chemo/RadTx- Dr. Eston Esters  ? Depression   ? GERD (gastroesophageal reflux disease)   ? past hx- not current  ? Hyperlipidemia   ? Hypertension   ? Osteopenia   ? Osteoporosis   ? improved- no meds-   ? Personal history of chemotherapy   ? Personal history of radiation therapy    ? ? ?Past Surgical History:  ?Procedure Laterality Date  ? ABDOMINAL HYSTERECTOMY    ? Total hysterectomy with BSO  ? BREAST BIOPSY Left 2019  ? benign  ? BREAST LUMPECTOMY Left 10/29/2008  ? Negative markers- 1 positive node  ? COLONOSCOPY    ? POLYPECTOMY    ? SEPTOPLASTY    ? SHOULDER SURGERY    ? TONSILLECTOMY AND ADENOIDECTOMY    ? ? ?Social History  ? ?Tobacco Use  ? Smoking status: Former  ? Smokeless tobacco: Never  ?Vaping Use  ? Vaping Use: Never used  ?Substance Use Topics  ? Alcohol use: Yes  ?  Alcohol/week: 5.0 standard drinks  ?  Types: 5 Cans of beer per week  ? Drug use: No  ? ? ?Family History  ?Problem Relation Age of Onset  ? Cancer Mother   ?     Throat cancer  ? Esophageal cancer Mother   ? Cancer Father   ?     Stomach cancer  ? Stomach cancer Father   ? Early death Maternal Grandmother   ? Pneumonia Maternal Grandmother   ? Heart attack Maternal Grandfather   ? Heart attack Paternal Grandfather   ? Colon cancer Neg Hx   ? Colon polyps Neg Hx   ? Rectal cancer Neg Hx   ? ? ?Allergies  ?Allergen Reactions  ? Topamax [Topiramate] Other (See Comments)  ?  Decreased cognitive function  ? ? ?Medication list has been reviewed and updated. ? ?Current Outpatient Medications on File Prior to Visit  ?Medication Sig Dispense Refill  ? Cholecalciferol 25 MCG (1000 UT) tablet Take by mouth.    ? magnesium 30 MG tablet Take 30 mg by mouth 2 (two) times daily.    ? vitamin B-12 (CYANOCOBALAMIN) 1000 MCG tablet Take 1,000 mcg by mouth daily.    ? ?No current facility-administered medications on file prior to visit.  ? ? ?Review of Systems: ? ?As per HPI- otherwise negative. ? ? ?Physical Examination: ?Vitals:  ? 06/02/21 0943  ?BP: 124/70  ?Pulse: (!) 59  ?Resp: 18  ?  Temp: 97.9 ?F (36.6 ?C)  ?SpO2: 98%  ? ?Vitals:  ? 06/02/21 0943  ?Weight: 153 lb 9.6 oz (69.7 kg)  ?Height: '5\' 4"'$  (1.626 m)  ? ?Body mass index is 26.37 kg/m?. ?Ideal Body Weight: Weight in (lb) to have BMI = 25: 145.3 ? ?GEN: no acute  distress. ?HEENT: Atraumatic, Normocephalic.  ?Ears and Nose: No external deformity. ?CV: RRR, No M/G/R. No JVD. No thrill. No extra heart sounds. ?PULM: CTA B, no wheezes, crackles, rhonchi. No retractions. No resp. distress. No accessory muscle use. ?ABD: S, NT, ND, +BS. No rebound. No HSM. ?EXTR: No c/c/e ?PSYCH: Normally interactive. Conversant.  ? ?Wt Readings from Last 3 Encounters:  ?06/02/21 153 lb 9.6 oz (69.7 kg)  ?12/30/20 160 lb 12.8 oz (72.9 kg)  ?12/03/20 160 lb 9.6 oz (72.8 kg)  ? ?She is down 7 lbs intentionally  ?Assessment and Plan: ?Physical exam ? ?Dyslipidemia - Plan: CT CARDIAC SCORING (SELF PAY ONLY), rosuvastatin (CRESTOR) 20 MG tablet ? ?B12 deficiency ? ?Benign essential hypertension - Plan: losartan (COZAAR) 50 MG tablet, amLODipine (NORVASC) 5 MG tablet ? ?Screening for diabetes mellitus ? ?Fatigue, unspecified type ? ?Physical exam today ?Encourage healthy diet and exercise routine  ?Went over recent labs ?Lipid control is reasonable but will get a coronary calcium score to help determine if we should increase crestor dose  ?Signed ?Lamar Blinks, MD ? ?

## 2021-05-28 DIAGNOSIS — H02831 Dermatochalasis of right upper eyelid: Secondary | ICD-10-CM | POA: Diagnosis not present

## 2021-05-28 DIAGNOSIS — H02834 Dermatochalasis of left upper eyelid: Secondary | ICD-10-CM | POA: Diagnosis not present

## 2021-05-29 ENCOUNTER — Other Ambulatory Visit (INDEPENDENT_AMBULATORY_CARE_PROVIDER_SITE_OTHER): Payer: Medicare HMO

## 2021-05-29 ENCOUNTER — Encounter: Payer: Self-pay | Admitting: Family Medicine

## 2021-05-29 DIAGNOSIS — R5383 Other fatigue: Secondary | ICD-10-CM

## 2021-05-29 DIAGNOSIS — Z131 Encounter for screening for diabetes mellitus: Secondary | ICD-10-CM

## 2021-05-29 DIAGNOSIS — E538 Deficiency of other specified B group vitamins: Secondary | ICD-10-CM | POA: Diagnosis not present

## 2021-05-29 DIAGNOSIS — E785 Hyperlipidemia, unspecified: Secondary | ICD-10-CM

## 2021-05-29 DIAGNOSIS — I10 Essential (primary) hypertension: Secondary | ICD-10-CM | POA: Diagnosis not present

## 2021-05-29 LAB — LIPID PANEL
Cholesterol: 220 mg/dL — ABNORMAL HIGH (ref 0–200)
HDL: 93.8 mg/dL (ref >39.00)
LDL Cholesterol: 112 mg/dL — ABNORMAL HIGH (ref 0–99)
NonHDL: 126.66
Total CHOL/HDL Ratio: 2
Triglycerides: 74 mg/dL (ref 0.0–149.0)
VLDL: 14.8 mg/dL (ref 0.0–40.0)

## 2021-05-29 LAB — COMPREHENSIVE METABOLIC PANEL
ALT: 24 U/L (ref 0–35)
AST: 25 U/L (ref 0–37)
Albumin: 4.5 g/dL (ref 3.5–5.2)
Alkaline Phosphatase: 74 U/L (ref 39–117)
BUN: 17 mg/dL (ref 6–23)
CO2: 29 mEq/L (ref 19–32)
Calcium: 10.2 mg/dL (ref 8.4–10.5)
Chloride: 103 mEq/L (ref 96–112)
Creatinine, Ser: 0.93 mg/dL (ref 0.40–1.20)
GFR: 61.71 mL/min (ref >60.00)
Glucose, Bld: 84 mg/dL (ref 70–99)
Potassium: 4.2 mEq/L (ref 3.5–5.1)
Sodium: 141 mEq/L (ref 135–145)
Total Bilirubin: 1.1 mg/dL (ref 0.2–1.2)
Total Protein: 6.6 g/dL (ref 6.0–8.3)

## 2021-05-29 LAB — CBC
HCT: 42.6 % (ref 36.0–46.0)
Hemoglobin: 14.3 g/dL (ref 12.0–15.0)
MCHC: 33.6 g/dL (ref 30.0–36.0)
MCV: 90.8 fl (ref 78.0–100.0)
Platelets: 287 10*3/uL (ref 150.0–400.0)
RBC: 4.69 Mil/uL (ref 3.87–5.11)
RDW: 13.9 % (ref 11.5–15.5)
WBC: 6.7 10*3/uL (ref 4.0–10.5)

## 2021-05-29 LAB — HEMOGLOBIN A1C: Hgb A1c MFr Bld: 5.2 % (ref 4.6–6.5)

## 2021-05-29 LAB — VITAMIN B12: Vitamin B-12: 480 pg/mL (ref 211–911)

## 2021-05-29 LAB — TSH: TSH: 3.34 u[IU]/mL (ref 0.35–5.50)

## 2021-05-29 NOTE — Progress Notes (Signed)
Results for orders placed or performed in visit on 05/29/21  ?Vitamin B12  ?Result Value Ref Range  ? Vitamin B-12 480 211 - 911 pg/mL  ?TSH  ?Result Value Ref Range  ? TSH 3.34 0.35 - 5.50 uIU/mL  ?Lipid panel  ?Result Value Ref Range  ? Cholesterol 220 (H) 0 - 200 mg/dL  ? Triglycerides 74.0 0.0 - 149.0 mg/dL  ? HDL 93.80 >39.00 mg/dL  ? VLDL 14.8 0.0 - 40.0 mg/dL  ? LDL Cholesterol 112 (H) 0 - 99 mg/dL  ? Total CHOL/HDL Ratio 2   ? NonHDL 126.66   ?Hemoglobin A1c  ?Result Value Ref Range  ? Hgb A1c MFr Bld 5.2 4.6 - 6.5 %  ?Comprehensive metabolic panel  ?Result Value Ref Range  ? Sodium 141 135 - 145 mEq/L  ? Potassium 4.2 3.5 - 5.1 mEq/L  ? Chloride 103 96 - 112 mEq/L  ? CO2 29 19 - 32 mEq/L  ? Glucose, Bld 84 70 - 99 mg/dL  ? BUN 17 6 - 23 mg/dL  ? Creatinine, Ser 0.93 0.40 - 1.20 mg/dL  ? Total Bilirubin 1.1 0.2 - 1.2 mg/dL  ? Alkaline Phosphatase 74 39 - 117 U/L  ? AST 25 0 - 37 U/L  ? ALT 24 0 - 35 U/L  ? Total Protein 6.6 6.0 - 8.3 g/dL  ? Albumin 4.5 3.5 - 5.2 g/dL  ? GFR 61.71 >60.00 mL/min  ? Calcium 10.2 8.4 - 10.5 mg/dL  ?CBC  ?Result Value Ref Range  ? WBC 6.7 4.0 - 10.5 K/uL  ? RBC 4.69 3.87 - 5.11 Mil/uL  ? Platelets 287.0 150.0 - 400.0 K/uL  ? Hemoglobin 14.3 12.0 - 15.0 g/dL  ? HCT 42.6 36.0 - 46.0 %  ? MCV 90.8 78.0 - 100.0 fl  ? MCHC 33.6 30.0 - 36.0 g/dL  ? RDW 13.9 11.5 - 15.5 %  ? ? ?

## 2021-05-30 DIAGNOSIS — M6281 Muscle weakness (generalized): Secondary | ICD-10-CM | POA: Diagnosis not present

## 2021-05-30 DIAGNOSIS — M25612 Stiffness of left shoulder, not elsewhere classified: Secondary | ICD-10-CM | POA: Diagnosis not present

## 2021-05-30 DIAGNOSIS — M25512 Pain in left shoulder: Secondary | ICD-10-CM | POA: Diagnosis not present

## 2021-05-30 DIAGNOSIS — G8929 Other chronic pain: Secondary | ICD-10-CM | POA: Diagnosis not present

## 2021-06-02 ENCOUNTER — Ambulatory Visit (INDEPENDENT_AMBULATORY_CARE_PROVIDER_SITE_OTHER): Payer: Medicare HMO | Admitting: Family Medicine

## 2021-06-02 VITALS — BP 124/70 | HR 59 | Temp 97.9°F | Resp 18 | Ht 64.0 in | Wt 153.6 lb

## 2021-06-02 DIAGNOSIS — Z0000 Encounter for general adult medical examination without abnormal findings: Secondary | ICD-10-CM

## 2021-06-02 DIAGNOSIS — I10 Essential (primary) hypertension: Secondary | ICD-10-CM | POA: Diagnosis not present

## 2021-06-02 DIAGNOSIS — E538 Deficiency of other specified B group vitamins: Secondary | ICD-10-CM | POA: Diagnosis not present

## 2021-06-02 DIAGNOSIS — R5383 Other fatigue: Secondary | ICD-10-CM

## 2021-06-02 DIAGNOSIS — M25512 Pain in left shoulder: Secondary | ICD-10-CM | POA: Diagnosis not present

## 2021-06-02 DIAGNOSIS — M25612 Stiffness of left shoulder, not elsewhere classified: Secondary | ICD-10-CM | POA: Diagnosis not present

## 2021-06-02 DIAGNOSIS — Z131 Encounter for screening for diabetes mellitus: Secondary | ICD-10-CM | POA: Diagnosis not present

## 2021-06-02 DIAGNOSIS — G8929 Other chronic pain: Secondary | ICD-10-CM | POA: Diagnosis not present

## 2021-06-02 DIAGNOSIS — M6281 Muscle weakness (generalized): Secondary | ICD-10-CM | POA: Diagnosis not present

## 2021-06-02 DIAGNOSIS — E785 Hyperlipidemia, unspecified: Secondary | ICD-10-CM | POA: Diagnosis not present

## 2021-06-02 MED ORDER — ROSUVASTATIN CALCIUM 20 MG PO TABS: 20.0000 mg | ORAL_TABLET | Freq: Every day | ORAL | 3 refills | Status: DC

## 2021-06-02 MED ORDER — AMLODIPINE BESYLATE 5 MG PO TABS: 5.0000 mg | ORAL_TABLET | Freq: Every day | ORAL | 3 refills | Status: DC

## 2021-06-02 MED ORDER — LOSARTAN POTASSIUM 50 MG PO TABS: 50.0000 mg | ORAL_TABLET | Freq: Every day | ORAL | 3 refills | Status: DC

## 2021-06-04 DIAGNOSIS — M6281 Muscle weakness (generalized): Secondary | ICD-10-CM | POA: Diagnosis not present

## 2021-06-04 DIAGNOSIS — G8929 Other chronic pain: Secondary | ICD-10-CM | POA: Diagnosis not present

## 2021-06-04 DIAGNOSIS — M25612 Stiffness of left shoulder, not elsewhere classified: Secondary | ICD-10-CM | POA: Diagnosis not present

## 2021-06-04 DIAGNOSIS — M25512 Pain in left shoulder: Secondary | ICD-10-CM | POA: Diagnosis not present

## 2021-06-09 DIAGNOSIS — G8929 Other chronic pain: Secondary | ICD-10-CM | POA: Diagnosis not present

## 2021-06-09 DIAGNOSIS — M25612 Stiffness of left shoulder, not elsewhere classified: Secondary | ICD-10-CM | POA: Diagnosis not present

## 2021-06-09 DIAGNOSIS — M6281 Muscle weakness (generalized): Secondary | ICD-10-CM | POA: Diagnosis not present

## 2021-06-09 DIAGNOSIS — M25512 Pain in left shoulder: Secondary | ICD-10-CM | POA: Diagnosis not present

## 2021-06-11 DIAGNOSIS — G8929 Other chronic pain: Secondary | ICD-10-CM | POA: Diagnosis not present

## 2021-06-11 DIAGNOSIS — M6281 Muscle weakness (generalized): Secondary | ICD-10-CM | POA: Diagnosis not present

## 2021-06-11 DIAGNOSIS — M25512 Pain in left shoulder: Secondary | ICD-10-CM | POA: Diagnosis not present

## 2021-06-11 DIAGNOSIS — M25612 Stiffness of left shoulder, not elsewhere classified: Secondary | ICD-10-CM | POA: Diagnosis not present

## 2021-06-19 ENCOUNTER — Telehealth: Payer: Self-pay | Admitting: Orthopaedic Surgery

## 2021-06-19 DIAGNOSIS — M25512 Pain in left shoulder: Secondary | ICD-10-CM | POA: Diagnosis not present

## 2021-06-19 DIAGNOSIS — M25612 Stiffness of left shoulder, not elsewhere classified: Secondary | ICD-10-CM | POA: Diagnosis not present

## 2021-06-19 DIAGNOSIS — G8929 Other chronic pain: Secondary | ICD-10-CM | POA: Diagnosis not present

## 2021-06-19 DIAGNOSIS — M6281 Muscle weakness (generalized): Secondary | ICD-10-CM | POA: Diagnosis not present

## 2021-06-19 NOTE — Telephone Encounter (Signed)
Ebony Hail from the rehab department @ [facility] is calling to verify that we have received PT plan of care for [place]. It needs to be signed by Durward Fortes and faxed back to [facility].  Fax: [phone]

## 2021-07-23 ENCOUNTER — Ambulatory Visit
Admission: RE | Admit: 2021-07-23 | Discharge: 2021-07-23 | Disposition: A | Discharge status: Home / Self Care | Payer: Self-pay | Source: Ambulatory Visit | Attending: Family Medicine | Admitting: Family Medicine

## 2021-07-23 DIAGNOSIS — E785 Hyperlipidemia, unspecified: Secondary | ICD-10-CM

## 2021-07-24 ENCOUNTER — Encounter: Payer: Self-pay | Admitting: Family Medicine

## 2021-07-24 DIAGNOSIS — I359 Nonrheumatic aortic valve disorder, unspecified: Secondary | ICD-10-CM

## 2021-07-30 NOTE — Progress Notes (Unsigned)
Cardiology Office Note   Date:  07/31/2021   ID:  Savi, Lastinger 10/08/1950, MRN [id number]  PCP:  Darreld Mclean, MD  Cardiologist:   Minus Breeding, MD Referring:  Darreld Mclean, MD   Chief Complaint  Patient presents with   Elevated coronary calcium      History of Present Illness: Leslie Crawford is a 71 y.o. female who presents for evaluation of elevated aortic valve and coronary calcium.  She is referred by Copland, Gay Filler, MD   She had a coronary calcium score recently and was found to have very mild coronary calcium with a level of 4. Which was 41st percentile.  There was mild calcium on the aortic valve.     She is very active.  She did have an echocardiogram in 2010 prior to breast cancer treatment.  This was normal.  She is never had any other cardiac testing.  She walks 3 to 4 miles daily.  She walks briskly. The patient denies any new symptoms such as chest discomfort, neck or arm discomfort. There has been no new shortness of breath, PND or orthopnea. There have been no reported palpitations, presyncope or syncope.    Past Medical History:  Diagnosis Date   Allergy    Anemia    Anxiety    Breast cancer ([place]) 2010   S/P Chemo/RadTx- Dr. Eston Esters   Depression    GERD (gastroesophageal reflux disease)    past hx- not current   Hyperlipidemia    Hypertension    Osteopenia    Osteoporosis    improved- no meds-    Personal history of chemotherapy    Personal history of radiation therapy     Past Surgical History:  Procedure Laterality Date   ABDOMINAL HYSTERECTOMY     Total hysterectomy with BSO   BREAST BIOPSY Left 2019   benign   BREAST LUMPECTOMY Left 10/29/2008   Negative markers- 1 positive node   COLONOSCOPY     POLYPECTOMY     SEPTOPLASTY     SHOULDER SURGERY     TONSILLECTOMY AND ADENOIDECTOMY       Current Outpatient Medications  Medication Sig Dispense Refill   amLODipine (NORVASC) 5 MG tablet Take 1  tablet (5 mg total) by mouth daily. 90 tablet 3   Cholecalciferol 25 MCG (1000 UT) tablet Take 1,000 Units by mouth daily.     losartan (COZAAR) 50 MG tablet Take 1 tablet (50 mg total) by mouth daily. 90 tablet 3   magnesium 30 MG tablet Take 30 mg by mouth daily in the afternoon.     OVER THE COUNTER MEDICATION Take 1 tablet by mouth every evening.     rosuvastatin (CRESTOR) 20 MG tablet Take 1 tablet (20 mg total) by mouth daily. 90 tablet 3   vitamin B-12 (CYANOCOBALAMIN) 1000 MCG tablet Take 1,000 mcg by mouth every other day.     No current facility-administered medications for this visit.    Allergies:   Topamax [topiramate]    Social History:  The patient  reports that she has quit smoking. She has never used smokeless tobacco. She reports current alcohol use of about 5.0 standard drinks of alcohol per week. She reports that she does not use drugs.   Family History:  The patient's family history includes Cancer in her father and mother; Early death in her maternal grandmother; Esophageal cancer in her mother; Heart attack in her maternal grandfather and paternal grandfather; Pneumonia  in her maternal grandmother; Stomach cancer in her father.    ROS:  Please see the history of present illness.   Otherwise, review of systems are positive for none.   All other systems are reviewed and negative.    PHYSICAL EXAM: VS:  BP 132/70 (BP Location: Right Arm, Patient Position: Sitting, Cuff Size: Normal)   Pulse 67   Ht '5\' 4"'$  (1.626 m)   Wt 156 lb 12.8 oz (71.1 kg)   SpO2 96%   BMI 26.91 kg/m  , BMI Body mass index is 26.91 kg/m. GENERAL:  Well appearing HEENT:  Pupils equal round and reactive, fundi not visualized, oral mucosa unremarkable NECK:  No jugular venous distention, waveform within normal limits, carotid upstroke brisk and symmetric, no bruits, no thyromegaly LYMPHATICS:  No cervical, inguinal adenopathy LUNGS:  Clear to auscultation bilaterally BACK:  No CVA  tenderness CHEST:  Unremarkable HEART:  PMI not displaced or sustained,S1 and S2 within normal limits, no S3, no S4, no clicks, no rubs, 2 out of 6 apical systolic murmur radiating slightly at the aortic outflow tract, no diastolic murmurs ABD:  Flat, positive bowel sounds normal in frequency in pitch, no bruits, no rebound, no guarding, no midline pulsatile mass, no hepatomegaly, no splenomegaly EXT:  2 plus pulses throughout, no edema, no cyanosis no clubbing SKIN:  No rashes no nodules NEURO:  Cranial nerves II through XII grossly intact, motor grossly intact throughout PSYCH:  Cognitively intact, oriented to person place and time    EKG:  EKG is ordered today. The ekg ordered today demonstrates sinus rhythm, rate 67, axis within normal limits, intervals within normal limits, RSR prime V1 and V2, no acute ST-T wave changes.   Recent Labs: 05/29/2021: ALT 24; BUN 17; Creatinine, Ser 0.93; Hemoglobin 14.3; Platelets 287.0; Potassium 4.2; Sodium 141; TSH 3.34    Lipid Panel    Component Value Date/Time   CHOL 220 (H) 05/29/2021 0814   TRIG 74.0 05/29/2021 0814   HDL 93.80 05/29/2021 0814   CHOLHDL 2 05/29/2021 0814   VLDL 14.8 05/29/2021 0814   LDLCALC 112 (H) 05/29/2021 0814   LDLDIRECT 196.2 08/01/2009 1006      Wt Readings from Last 3 Encounters:  07/31/21 156 lb 12.8 oz (71.1 kg)  06/02/21 153 lb 9.6 oz (69.7 kg)  12/30/20 160 lb 12.8 oz (72.9 kg)      Other studies Reviewed: Additional studies/ records that were reviewed today include: Coronary calcium CT. Review of the above records demonstrates:  Please see elsewhere in the note.     ASSESSMENT AND PLAN:  ELEVATED CORONARY CALCIUM:    She has a Framingham risk score that is 4.3 and it actually goes down with MESA calculation to 2.6.  Therefore, aspirin is not indicated.  I do not think her goals of therapy with her lipids change or that she needs to take a higher dose of statin.  She has absolutely no symptoms.   This is a very mild calcium load and I would not think further testing is indicated.  AORTIC VALVE CALCIFICATION: She does have a heart murmur and I suspect some sclerosis or mild stenosis and I will check an echocardiogram but I suspect this can be followed clinically.  DYSLIPIDEMIA: I have not suggested any change in therapy at this time.  We discussed a plant-based Mediterranean diet.  Current medicines are reviewed at length with the patient today.  The patient does not have concerns regarding medicines.  The following changes  have been made:  no change  Labs/ tests ordered today include:   Orders Placed This Encounter  Procedures   EKG 12-Lead   ECHOCARDIOGRAM COMPLETE     Disposition:   FU with me in a couple of years   Signed, Minus Breeding, MD  07/31/2021 10:24 AM    [place]

## 2021-07-31 ENCOUNTER — Encounter: Payer: Self-pay | Admitting: Cardiology

## 2021-07-31 ENCOUNTER — Ambulatory Visit: Payer: Medicare HMO | Admitting: Cardiology

## 2021-07-31 VITALS — BP 132/70 | HR 67 | Ht 64.0 in | Wt 156.8 lb

## 2021-07-31 DIAGNOSIS — R011 Cardiac murmur, unspecified: Secondary | ICD-10-CM

## 2021-07-31 NOTE — Patient Instructions (Signed)
Medication Instructions:  No changes *If you need a refill on your cardiac medications before your next appointment, please call your pharmacy*   Lab Work: None ordered If you have labs (blood work) drawn today and your tests are completely normal, you will receive your results only by: [place] (if you have MyChart) OR A paper copy in the mail If you have any lab test that is abnormal or we need to change your treatment, we will call you to review the results.   Testing/Procedures: Your physician has requested that you have an echocardiogram. Echocardiography is a painless test that uses sound waves to create images of your heart. It provides your doctor with information about the size and shape of your heart and how well your heart's chambers and valves are working. You may receive an ultrasound enhancing agent through an IV if needed to better visualize your heart during the echo.This procedure takes approximately one hour. There are no restrictions for this procedure. This will take place at the [address].     Follow-Up: At [facility], you and your health needs are our priority.  As part of our continuing mission to provide you with exceptional heart care, we have created designated Provider Care Teams.  These Care Teams include your primary Cardiologist (physician) and Advanced Practice Providers (APPs -  Physician Assistants and Nurse Practitioners) who all work together to provide you with the care you need, when you need it.  We recommend signing up for the patient portal called "MyChart".  Sign up information is provided on this After Visit Summary.  MyChart is used to connect with patients for Virtual Visits (Telemedicine).  Patients are able to view lab/test results, encounter notes, upcoming appointments, etc.  Non-urgent messages can be sent to your provider as well.   To learn more about what you can do with MyChart, go to [email or web address].     Your next appointment:   2 year(s)  The format for your next appointment:   In Person  Provider:   Minus Breeding, MD {

## 2021-08-12 ENCOUNTER — Ambulatory Visit (HOSPITAL_COMMUNITY): Payer: Medicare HMO | Attending: Cardiology

## 2021-08-12 DIAGNOSIS — R011 Cardiac murmur, unspecified: Secondary | ICD-10-CM | POA: Insufficient documentation

## 2021-08-12 LAB — ECHOCARDIOGRAM COMPLETE
AR max vel: 1.54 cm2
AV Area VTI: 1.53 cm2
AV Area mean vel: 1.5 cm2
AV Mean grad: 10 mmHg
AV Peak grad: 17.8 mmHg
Ao pk vel: 2.11 m/s
Area-P 1/2: 2.62 cm2
S' Lateral: 2.6 cm

## 2021-08-25 ENCOUNTER — Ambulatory Visit (INDEPENDENT_AMBULATORY_CARE_PROVIDER_SITE_OTHER): Payer: Medicare HMO | Admitting: Family

## 2021-08-25 VITALS — BP 121/58 | HR 86 | Temp 98.2°F | Resp 16 | Wt 153.0 lb

## 2021-08-25 DIAGNOSIS — W548XXA Other contact with dog, initial encounter: Secondary | ICD-10-CM | POA: Insufficient documentation

## 2021-08-25 DIAGNOSIS — L03113 Cellulitis of right upper limb: Secondary | ICD-10-CM | POA: Diagnosis not present

## 2021-08-25 MED ORDER — AMOXICILLIN-POT CLAVULANATE 875-125 MG PO TABS: 1.0000 | ORAL_TABLET | Freq: Two times a day (BID) | ORAL | 0 refills | Status: DC

## 2021-08-25 MED ORDER — FLUCONAZOLE 150 MG PO TABS: 150.0000 mg | ORAL_TABLET | Freq: Every day | ORAL | 0 refills | Status: DC

## 2021-08-25 NOTE — Progress Notes (Signed)
Subjective:   By signing my name below, I, Leslie Crawford, attest that this documentation has been prepared under the direction and in the presence of Leslie Alar, NP 08/25/2021      Patient ID: Leslie Crawford, female    DOB: [birth year], 71 y.o.   MRN: [id number]  Chief Complaint  Patient presents with   Arm Injury    Patient comes in for a scratch on her right arm.     Arm Injury    Patient is in today for a office visit.   She complains of a scratch with surrounding erythema on her right forearm since Thursday last week. She reports her dog accidentally scratched her. She is applying OTC neosporin over the area and later developed surrounding erythema. She is prone to yeast infections and is interested in taking diflucan with anti-biotics.    There are no preventive care reminders to display for this patient.  Past Medical History:  Diagnosis Date   Allergy    Anemia    Anxiety    Breast cancer ([place]) 2010   S/P Chemo/RadTx- Dr. Eston Esters   Depression    GERD (gastroesophageal reflux disease)    past hx- not current   Hyperlipidemia    Hypertension    Osteopenia    Osteoporosis    improved- no meds-    Personal history of chemotherapy    Personal history of radiation therapy     Past Surgical History:  Procedure Laterality Date   ABDOMINAL HYSTERECTOMY     Total hysterectomy with BSO   BREAST BIOPSY Left 2019   benign   BREAST LUMPECTOMY Left 10/29/2008   Negative markers- 1 positive node   COLONOSCOPY     POLYPECTOMY     SEPTOPLASTY     SHOULDER SURGERY     TONSILLECTOMY AND ADENOIDECTOMY      Family History  Problem Relation Age of Onset   Cancer Mother        Throat cancer   Esophageal cancer Mother    Cancer Father        Stomach cancer   Stomach cancer Father    Early death Maternal Grandmother    Pneumonia Maternal Grandmother    Heart attack Maternal Grandfather    Heart attack Paternal Grandfather    Colon cancer Neg Hx     Colon polyps Neg Hx    Rectal cancer Neg Hx     Social History   Socioeconomic History   Marital status: Married    Spouse name: Not on file   Number of children: Not on file   Years of education: Not on file   Highest education level: Not on file  Occupational History   Not on file  Tobacco Use   Smoking status: Former   Smokeless tobacco: Never  Vaping Use   Vaping Use: Never used  Substance and Sexual Activity   Alcohol use: Yes    Alcohol/week: 5.0 standard drinks of alcohol    Types: 5 Cans of beer per week   Drug use: No   Sexual activity: Yes    Birth control/protection: Surgical  Other Topics Concern   Not on file  Social History Narrative   Not on file   Social Determinants of Health   Financial Resource Strain: Low Risk  (12/03/2020)   Overall Financial Resource Strain (CARDIA)    Difficulty of Paying Living Expenses: Not hard at all  Food Insecurity: No Food Insecurity (12/03/2020)   Hunger Vital  Sign    Worried About Charity fundraiser in the Last Year: Never true    [place] in the Last Year: Never true  Transportation Needs: No Transportation Needs (12/03/2020)   PRAPARE - Hydrologist (Medical): No    Lack of Transportation (Non-Medical): No  Physical Activity: Insufficiently Active (12/03/2020)   Exercise Vital Sign    Days of Exercise per Week: 4 days    Minutes of Exercise per Session: 30 min  Stress: No Stress Concern Present (12/03/2020)   [place]    Feeling of Stress : Not at all  Social Connections: Moderately Isolated (12/03/2020)   Social Connection and Isolation Panel [NHANES]    Frequency of Communication with Friends and Family: More than three times a week    Frequency of Social Gatherings with Friends and Family: More than three times a week    Attends Religious Services: Never    Marine scientist or Organizations: No     Attends Archivist Meetings: Never    Marital Status: Married  Human resources officer Violence: Not At Risk (12/03/2020)   Humiliation, Afraid, Rape, and Kick questionnaire    Fear of Current or Ex-Partner: No    Emotionally Abused: No    Physically Abused: No    Sexually Abused: No    Outpatient Medications Prior to Visit  Medication Sig Dispense Refill   amLODipine (NORVASC) 5 MG tablet Take 1 tablet (5 mg total) by mouth daily. 90 tablet 3   Cholecalciferol 25 MCG (1000 UT) tablet Take 1,000 Units by mouth daily.     losartan (COZAAR) 50 MG tablet Take 1 tablet (50 mg total) by mouth daily. 90 tablet 3   magnesium 30 MG tablet Take 30 mg by mouth daily in the afternoon.     OVER THE COUNTER MEDICATION Take 1 tablet by mouth every evening.     rosuvastatin (CRESTOR) 20 MG tablet Take 1 tablet (20 mg total) by mouth daily. 90 tablet 3   vitamin B-12 (CYANOCOBALAMIN) 1000 MCG tablet Take 1,000 mcg by mouth every other day.     No facility-administered medications prior to visit.    Allergies  Allergen Reactions   Topamax [Topiramate] Other (See Comments)    Decreased cognitive function    Review of Systems  Skin:        (+)dog scratch on right forearm (+)erythema surrounding dog scratch       Objective:    Physical Exam Constitutional:      General: She is not in acute distress.    Appearance: Normal appearance. She is not ill-appearing.  HENT:     Head: Normocephalic and atraumatic.     Right Ear: External ear normal.     Left Ear: External ear normal.  Eyes:     Extraocular Movements: Extraocular movements intact.     Pupils: Pupils are equal, round, and reactive to light.  Cardiovascular:     Rate and Rhythm: Normal rate.  Pulmonary:     Effort: Pulmonary effort is normal.  Skin:    General: Skin is warm and dry.     Comments: Scratch wound right forearm with surrounding erythema.  Neurological:     Mental Status: She is alert and oriented to person,  place, and time.  Psychiatric:        Judgment: Judgment normal.      BP (!) 121/58 (BP Location:  Right Arm, Patient Position: Sitting, Cuff Size: Small)   Pulse 86   Temp 98.2 F (36.8 C) (Oral)   Resp 16   Wt 153 lb (69.4 kg)   SpO2 99%   BMI 26.26 kg/m  Wt Readings from Last 3 Encounters:  08/25/21 153 lb (69.4 kg)  07/31/21 156 lb 12.8 oz (71.1 kg)  06/02/21 153 lb 9.6 oz (69.7 kg)       Assessment & Plan:   Problem List Items Addressed This Visit       Unprioritized   Cellulitis of right upper extremity - Primary    Secondary to dog scratch. Will rx with augmentin. She is prone to vaginal yeast infections with abx. Rx sent for empiric diflucan. She is advised to wash wound with warm soapy water daily, apply antibiotic ointment/bandage.  Ointment application/dressing change performed today. She is advised to call if increased redness, pain or if symptoms do not improve.         Meds ordered this encounter  Medications   amoxicillin-clavulanate (AUGMENTIN) 875-125 MG tablet    Sig: Take 1 tablet by mouth 2 (two) times daily.    Dispense:  20 tablet    Refill:  0    Order Specific Question:   Supervising Provider    Answer:   Penni Homans A [[id number]]   fluconazole (DIFLUCAN) 150 MG tablet    Sig: Take 1 tablet (150 mg total) by mouth daily. As needed for vaginal yeast infection.    Dispense:  1 tablet    Refill:  0    Order Specific Question:   Supervising Provider    Answer:   Penni Homans A [[id number]]    I, Nance Pear, NP, personally preformed the services described in this documentation.  All medical record entries made by the scribe were at my direction and in my presence.  I have reviewed the chart and discharge instructions (if applicable) and agree that the record reflects my personal performance and is accurate and complete. 08/25/2021   I,Leslie Crawford,acting as a Education administrator for Nance Pear, NP.,have documented all relevant documentation on  the behalf of Nance Pear, NP,as directed by  Nance Pear, NP while in the presence of Nance Pear, NP.   Nance Pear, NP

## 2021-08-25 NOTE — Progress Notes (Deleted)
Subjective:   By signing my name below, I, [name], attest that this documentation has been prepared under the direction and in the presence of [name], NP 08/25/2021      Patient ID: Leslie Crawford, female    DOB: 1950-03-17, 71 y.o.   MRN: [id number]  Chief Complaint  Patient presents with   Arm Injury    Patient comes in for a scratch on her right arm.     HPI Patient is in today for a office visit.   She complains of a scratch with surrounding erythema on her right forearm since Thursday last week. She reports her dog accidentally scratched her. She is applying OTC neosporin over the area and later developed surrounding erythema. She is prone to yeast infections and is interested in taking diflucan with anti-biotics.    There are no preventive care reminders to display for this patient.  Past Medical History:  Diagnosis Date   Allergy    Anemia    Anxiety    Breast cancer ([place]) 2010   S/P Chemo/RadTx- Dr. Eston Esters   Depression    GERD (gastroesophageal reflux disease)    past hx- not current   Hyperlipidemia    Hypertension    Osteopenia    Osteoporosis    improved- no meds-    Personal history of chemotherapy    Personal history of radiation therapy     Past Surgical History:  Procedure Laterality Date   ABDOMINAL HYSTERECTOMY     Total hysterectomy with BSO   BREAST BIOPSY Left 2019   benign   BREAST LUMPECTOMY Left 10/29/2008   Negative markers- 1 positive node   COLONOSCOPY     POLYPECTOMY     SEPTOPLASTY     SHOULDER SURGERY     TONSILLECTOMY AND ADENOIDECTOMY      Family History  Problem Relation Age of Onset   Cancer Mother        Throat cancer   Esophageal cancer Mother    Cancer Father        Stomach cancer   Stomach cancer Father    Early death Maternal Grandmother    Pneumonia Maternal Grandmother    Heart attack Maternal Grandfather    Heart attack Paternal Grandfather    Colon cancer Neg Hx    Colon  polyps Neg Hx    Rectal cancer Neg Hx     Social History   Socioeconomic History   Marital status: Married    Spouse name: Not on file   Number of children: Not on file   Years of education: Not on file   Highest education level: Not on file  Occupational History   Not on file  Tobacco Use   Smoking status: Former   Smokeless tobacco: Never  Vaping Use   Vaping Use: Never used  Substance and Sexual Activity   Alcohol use: Yes    Alcohol/week: 5.0 standard drinks of alcohol    Types: 5 Cans of beer per week   Drug use: No   Sexual activity: Yes    Birth control/protection: Surgical  Other Topics Concern   Not on file  Social History Narrative   Not on file   Social Determinants of Health   Financial Resource Strain: Low Risk  (12/03/2020)   Overall Financial Resource Strain (CARDIA)    Difficulty of Paying Living Expenses: Not hard at all  Food Insecurity: No Food Insecurity (12/03/2020)   Hunger Vital Sign  Worried About Charity fundraiser in the Last Year: Never true    [place] in the Last Year: Never true  Transportation Needs: No Transportation Needs (12/03/2020)   PRAPARE - Hydrologist (Medical): No    Lack of Transportation (Non-Medical): No  Physical Activity: Insufficiently Active (12/03/2020)   Exercise Vital Sign    Days of Exercise per Week: 4 days    Minutes of Exercise per Session: 30 min  Stress: No Stress Concern Present (12/03/2020)   [place]    Feeling of Stress : Not at all  Social Connections: Moderately Isolated (12/03/2020)   Social Connection and Isolation Panel [NHANES]    Frequency of Communication with Friends and Family: More than three times a week    Frequency of Social Gatherings with Friends and Family: More than three times a week    Attends Religious Services: Never    Marine scientist or Organizations: No    Attends English as a second language teacher Meetings: Never    Marital Status: Married  Human resources officer Violence: Not At Risk (12/03/2020)   Humiliation, Afraid, Rape, and Kick questionnaire    Fear of Current or Ex-Partner: No    Emotionally Abused: No    Physically Abused: No    Sexually Abused: No    Outpatient Medications Prior to Visit  Medication Sig Dispense Refill   amLODipine (NORVASC) 5 MG tablet Take 1 tablet (5 mg total) by mouth daily. 90 tablet 3   Cholecalciferol 25 MCG (1000 UT) tablet Take 1,000 Units by mouth daily.     losartan (COZAAR) 50 MG tablet Take 1 tablet (50 mg total) by mouth daily. 90 tablet 3   magnesium 30 MG tablet Take 30 mg by mouth daily in the afternoon.     OVER THE COUNTER MEDICATION Take 1 tablet by mouth every evening.     rosuvastatin (CRESTOR) 20 MG tablet Take 1 tablet (20 mg total) by mouth daily. 90 tablet 3   vitamin B-12 (CYANOCOBALAMIN) 1000 MCG tablet Take 1,000 mcg by mouth every other day.     No facility-administered medications prior to visit.    Allergies  Allergen Reactions   Topamax [Topiramate] Other (See Comments)    Decreased cognitive function    Review of Systems  Skin:        (+)dog scratch on right forearm (+)erythema surrounding dog scratch       Objective:    Physical Exam Constitutional:      General: She is not in acute distress.    Appearance: Normal appearance. She is not ill-appearing.  HENT:     Head: Normocephalic and atraumatic.     Right Ear: External ear normal.     Left Ear: External ear normal.  Eyes:     Extraocular Movements: Extraocular movements intact.     Pupils: Pupils are equal, round, and reactive to light.  Cardiovascular:     Rate and Rhythm: Normal rate.  Pulmonary:     Effort: Pulmonary effort is normal.  Skin:    General: Skin is warm and dry.     Comments: Scratch wound right forearm with surrounding erythema.  Neurological:     Mental Status: She is alert and oriented to person, place, and time.   Psychiatric:        Judgment: Judgment normal.     BP (!) 121/58 (BP Location: Right Arm, Patient Position: Sitting,  Cuff Size: Small)   Pulse 86   Temp 98.2 F (36.8 C) (Oral)   Resp 16   Wt 153 lb (69.4 kg)   SpO2 99%   BMI 26.26 kg/m  Wt Readings from Last 3 Encounters:  08/25/21 153 lb (69.4 kg)  07/31/21 156 lb 12.8 oz (71.1 kg)  06/02/21 153 lb 9.6 oz (69.7 kg)       Assessment & Plan:   Problem List Items Addressed This Visit       Unprioritized   Cellulitis of right upper extremity - Primary    Secondary to dog scratch. Will rx with augmentin. She is prone to vaginal yeast infections with abx. Rx sent for empiric diflucan. She is advised to wash wound with warm soapy water daily, apply antibiotic ointment/bandage.  Ointment application/dressing change performed today. She is advised to call if increased redness, pain or if symptoms do not improve.         Meds ordered this encounter  Medications   amoxicillin-clavulanate (AUGMENTIN) 875-125 MG tablet    Sig: Take 1 tablet by mouth 2 (two) times daily.    Dispense:  20 tablet    Refill:  0    Order Specific Question:   Supervising Provider    Answer:   Penni Homans A [[id number]]   fluconazole (DIFLUCAN) 150 MG tablet    Sig: Take 1 tablet (150 mg total) by mouth daily. As needed for vaginal yeast infection.    Dispense:  1 tablet    Refill:  0    Order Specific Question:   Supervising Provider    Answer:   Penni Homans A [[id number]]    I, Nance Pear, NP, personally preformed the services described in this documentation.  All medical record entries made by the scribe were at my direction and in my presence.  I have reviewed the chart and discharge instructions (if applicable) and agree that the record reflects my personal performance and is accurate and complete. 08/25/2021   I,[name],acting as a Education administrator for Nance Pear, NP.,have documented all relevant documentation on the behalf of  Nance Pear, NP,as directed by  Nance Pear, NP while in the presence of Nance Pear, NP.   Nance Pear, NP

## 2021-08-25 NOTE — Assessment & Plan Note (Signed)
Secondary to dog scratch. Will rx with augmentin. She is prone to vaginal yeast infections with abx. Rx sent for empiric diflucan. She is advised to wash wound with warm soapy water daily, apply antibiotic ointment/bandage.  Ointment application/dressing change performed today. She is advised to call if increased redness, pain or if symptoms do not improve.

## 2021-09-01 DIAGNOSIS — D692 Other nonthrombocytopenic purpura: Secondary | ICD-10-CM | POA: Diagnosis not present

## 2021-09-01 DIAGNOSIS — L821 Other seborrheic keratosis: Secondary | ICD-10-CM | POA: Diagnosis not present

## 2021-09-01 DIAGNOSIS — X32XXXS Exposure to sunlight, sequela: Secondary | ICD-10-CM | POA: Diagnosis not present

## 2021-09-01 DIAGNOSIS — L814 Other melanin hyperpigmentation: Secondary | ICD-10-CM | POA: Diagnosis not present

## 2021-12-08 ENCOUNTER — Ambulatory Visit: Payer: Medicare HMO

## 2022-02-05 DIAGNOSIS — R69 Illness, unspecified: Secondary | ICD-10-CM | POA: Diagnosis not present

## 2022-03-30 ENCOUNTER — Other Ambulatory Visit: Payer: Self-pay | Admitting: Family Medicine

## 2022-03-30 DIAGNOSIS — Z1231 Encounter for screening mammogram for malignant neoplasm of breast: Secondary | ICD-10-CM

## 2022-04-22 ENCOUNTER — Encounter: Payer: Self-pay | Admitting: Family Medicine

## 2022-04-22 DIAGNOSIS — Z13220 Encounter for screening for lipoid disorders: Secondary | ICD-10-CM

## 2022-04-22 DIAGNOSIS — R5383 Other fatigue: Secondary | ICD-10-CM

## 2022-04-22 DIAGNOSIS — Z1329 Encounter for screening for other suspected endocrine disorder: Secondary | ICD-10-CM

## 2022-04-22 DIAGNOSIS — Z130 Encounter for screening for diseases of the blood and blood-forming organs and certain disorders involving the immune mechanism: Secondary | ICD-10-CM

## 2022-04-22 DIAGNOSIS — Z131 Encounter for screening for diabetes mellitus: Secondary | ICD-10-CM

## 2022-05-18 ENCOUNTER — Ambulatory Visit: Payer: Medicare HMO

## 2022-05-18 ENCOUNTER — Ambulatory Visit
Admission: RE | Admit: 2022-05-18 | Discharge: 2022-05-18 | Disposition: A | Discharge status: Home / Self Care | Payer: Medicare HMO | Source: Ambulatory Visit | Attending: Family Medicine | Admitting: Family Medicine

## 2022-05-18 DIAGNOSIS — Z1231 Encounter for screening mammogram for malignant neoplasm of breast: Secondary | ICD-10-CM

## 2022-06-03 ENCOUNTER — Other Ambulatory Visit (INDEPENDENT_AMBULATORY_CARE_PROVIDER_SITE_OTHER): Payer: Medicare HMO

## 2022-06-03 DIAGNOSIS — Z130 Encounter for screening for diseases of the blood and blood-forming organs and certain disorders involving the immune mechanism: Secondary | ICD-10-CM | POA: Diagnosis not present

## 2022-06-03 DIAGNOSIS — R5383 Other fatigue: Secondary | ICD-10-CM | POA: Diagnosis not present

## 2022-06-03 DIAGNOSIS — Z13220 Encounter for screening for lipoid disorders: Secondary | ICD-10-CM

## 2022-06-03 DIAGNOSIS — Z131 Encounter for screening for diabetes mellitus: Secondary | ICD-10-CM | POA: Diagnosis not present

## 2022-06-03 DIAGNOSIS — Z1329 Encounter for screening for other suspected endocrine disorder: Secondary | ICD-10-CM | POA: Diagnosis not present

## 2022-06-03 LAB — TSH: TSH: 4.19 u[IU]/mL (ref 0.35–5.50)

## 2022-06-03 LAB — COMPREHENSIVE METABOLIC PANEL
ALT: 22 U/L (ref 0–35)
AST: 28 U/L (ref 0–37)
Albumin: 4.5 g/dL (ref 3.5–5.2)
Alkaline Phosphatase: 70 U/L (ref 39–117)
BUN: 19 mg/dL (ref 6–23)
CO2: 27 mEq/L (ref 19–32)
Calcium: 9.9 mg/dL (ref 8.4–10.5)
Chloride: 103 mEq/L (ref 96–112)
Creatinine, Ser: 1 mg/dL (ref 0.40–1.20)
GFR: 56.16 mL/min — ABNORMAL LOW (ref >60.00)
Glucose, Bld: 79 mg/dL (ref 70–99)
Potassium: 4.8 mEq/L (ref 3.5–5.1)
Sodium: 139 mEq/L (ref 135–145)
Total Bilirubin: 1 mg/dL (ref 0.2–1.2)
Total Protein: 6.7 g/dL (ref 6.0–8.3)

## 2022-06-03 LAB — CBC
HCT: 41.4 % (ref 36.0–46.0)
Hemoglobin: 14.2 g/dL (ref 12.0–15.0)
MCHC: 34.2 g/dL (ref 30.0–36.0)
MCV: 90.4 fl (ref 78.0–100.0)
Platelets: 289 10*3/uL (ref 150.0–400.0)
RBC: 4.58 Mil/uL (ref 3.87–5.11)
RDW: 13.4 % (ref 11.5–15.5)
WBC: 8 10*3/uL (ref 4.0–10.5)

## 2022-06-03 LAB — LIPID PANEL
Cholesterol: 176 mg/dL (ref 0–200)
HDL: 83.4 mg/dL (ref >39.00)
LDL Cholesterol: 79 mg/dL (ref 0–99)
NonHDL: 92.22
Total CHOL/HDL Ratio: 2
Triglycerides: 66 mg/dL (ref 0.0–149.0)
VLDL: 13.2 mg/dL (ref 0.0–40.0)

## 2022-06-03 LAB — HEMOGLOBIN A1C: Hgb A1c MFr Bld: 5.3 % (ref 4.6–6.5)

## 2022-06-04 ENCOUNTER — Encounter: Payer: Self-pay | Admitting: Family Medicine

## 2022-06-04 NOTE — Progress Notes (Unsigned)
Signed Abbe Amsterdam, MD[facility] [address], [address] [place], [place] [address] [phone]  Date:  06/11/2022   Name:  Leslie Crawford   DOB:  [birth year]   MRN:  [id number]  PCP:  Pearline Cables, MD    Chief Complaint: No chief complaint on file.   History of Present Illness:  Leslie Crawford is a 72 y.o. very pleasant female patient who presents with the following:  Patient seen today for physical exam.  She also recently came in for blood work as below Most recent visit with myself about 1 year ago  She has history of hypertension, hyperlipidemia, breast cancer She is also been seen by neurology for ocular migraine She had breast cancer over a decade ago, diagnosed in 2010.  She had surgery, chemo and radiation. She does continue regular mammogram  Mammogram completed in February Colon cancer screening up-to-date DEXA scan 2 years ago, can be updated-osteopenia range at last screening  She did a CT coronary calcium last year, 41st percentile  Amlodipine 5 Losartan 50 Crestor 20  Results for orders placed or performed in visit on 06/03/22  TSH  Result Value Ref Range   TSH 4.19 0.35 - 5.50 uIU/mL  Lipid panel  Result Value Ref Range   Cholesterol 176 0 - 200 mg/dL   Triglycerides 29.5 0.0 - 149.0 mg/dL   HDL 28.41 >32.44 mg/dL   VLDL 01.0 0.0 - 27.2 mg/dL   LDL Cholesterol 79 0 - 99 mg/dL   Total CHOL/HDL Ratio 2    NonHDL 92.22   Hemoglobin A1c  Result Value Ref Range   Hgb A1c MFr Bld 5.3 4.6 - 6.5 %  Comprehensive metabolic panel  Result Value Ref Range   Sodium 139 135 - 145 mEq/L   Potassium 4.8 3.5 - 5.1 mEq/L   Chloride 103 96 - 112 mEq/L   CO2 27 19 - 32 mEq/L   Glucose, Bld 79 70 - 99 mg/dL   BUN 19 6 - 23 mg/dL   Creatinine, Ser 5.36 0.40 - 1.20 mg/dL   Total Bilirubin 1.0 0.2 - 1.2 mg/dL   Alkaline Phosphatase 70 39 - 117 U/L   AST 28 0 - 37 U/L   ALT 22 0 - 35  U/L   Total Protein 6.7 6.0 - 8.3 g/dL   Albumin 4.5 3.5 - 5.2 g/dL   GFR 64.40 (L) >34.74 mL/min   Calcium 9.9 8.4 - 10.5 mg/dL  CBC  Result Value Ref Range   WBC 8.0 4.0 - 10.5 K/uL   RBC 4.58 3.87 - 5.11 Mil/uL   Platelets 289.0 150.0 - 400.0 K/uL   Hemoglobin 14.2 12.0 - 15.0 g/dL   HCT 25.9 56.3 - 87.5 %   MCV 90.4 78.0 - 100.0 fl   MCHC 34.2 30.0 - 36.0 g/dL   RDW 64.3 32.9 - 51.8 %     Patient Active Problem List   Diagnosis Date Noted   Dog scratch 08/25/2021   Cellulitis of right upper extremity 08/25/2021   Complete tear of left rotator cuff 01/16/2021   Pain in left shoulder 01/01/2021   Osteopenia 04/23/2020   Migraine 02/27/2019   High blood pressure 10/15/2016   ADENOCARCINOMA, BREAST 10/26/2008   ANXIETY 10/26/2008   HYPERLIPIDEMIA 07/17/2008   GERD 07/01/2007   OSTEOPENIA 07/01/2007   INSOMNIA-SLEEP DISORDER-UNSPEC 07/01/2007    Past Medical History:  Diagnosis Date   Allergy  Anemia    Anxiety    Breast cancer (HCC) 2010   S/P Chemo/RadTx- Dr. Pierce Crane   Depression    GERD (gastroesophageal reflux disease)    past hx- not current   Hyperlipidemia    Hypertension    Osteopenia    Osteoporosis    improved- no meds-    Personal history of chemotherapy    Personal history of radiation therapy     Past Surgical History:  Procedure Laterality Date   ABDOMINAL HYSTERECTOMY     Total hysterectomy with BSO   BREAST BIOPSY Left 2019   benign   BREAST LUMPECTOMY Left 10/29/2008   Negative markers- 1 positive node   COLONOSCOPY     POLYPECTOMY     SEPTOPLASTY     SHOULDER SURGERY     TONSILLECTOMY AND ADENOIDECTOMY      Social History   Tobacco Use   Smoking status: Former   Smokeless tobacco: Never  Vaping Use   Vaping Use: Never used  Substance Use Topics   Alcohol use: Yes    Alcohol/week: 5.0 standard drinks of alcohol    Types: 5 Cans of beer per week   Drug use: No    Family History  Problem Relation Age of Onset    Cancer Mother        Throat cancer   Esophageal cancer Mother    Cancer Father        Stomach cancer   Stomach cancer Father    Early death Maternal Grandmother    Pneumonia Maternal Grandmother    Heart attack Maternal Grandfather    Heart attack Paternal Grandfather    Colon cancer Neg Hx    Colon polyps Neg Hx    Rectal cancer Neg Hx     Allergies  Allergen Reactions   Topamax [Topiramate] Other (See Comments)    Decreased cognitive function    Medication list has been reviewed and updated.  Current Outpatient Medications on File Prior to Visit  Medication Sig Dispense Refill   amLODipine (NORVASC) 5 MG tablet Take 1 tablet (5 mg total) by mouth daily. 90 tablet 3   amoxicillin-clavulanate (AUGMENTIN) 875-125 MG tablet Take 1 tablet by mouth 2 (two) times daily. 20 tablet 0   Cholecalciferol 25 MCG (1000 UT) tablet Take 1,000 Units by mouth daily.     fluconazole (DIFLUCAN) 150 MG tablet Take 1 tablet (150 mg total) by mouth daily. As needed for vaginal yeast infection. 1 tablet 0   losartan (COZAAR) 50 MG tablet Take 1 tablet (50 mg total) by mouth daily. 90 tablet 3   magnesium 30 MG tablet Take 30 mg by mouth daily in the afternoon.     OVER THE COUNTER MEDICATION Take 1 tablet by mouth every evening.     rosuvastatin (CRESTOR) 20 MG tablet Take 1 tablet (20 mg total) by mouth daily. 90 tablet 3   vitamin B-12 (CYANOCOBALAMIN) 1000 MCG tablet Take 1,000 mcg by mouth every other day.     No current facility-administered medications on file prior to visit.    Review of Systems:  As per HPI- otherwise negative.   Physical Examination: There were no vitals filed for this visit. There were no vitals filed for this visit. There is no height or weight on file to calculate BMI. Ideal Body Weight:    GEN: no acute distress. HEENT: Atraumatic, Normocephalic.  Ears and Nose: No external deformity. CV: RRR, No M/G/R. No JVD. No thrill. No extra heart  sounds. PULM: CTA  B, no wheezes, crackles, rhonchi. No retractions. No resp. distress. No accessory muscle use. ABD: S, NT, ND, +BS. No rebound. No HSM. EXTR: No c/c/e PSYCH: Normally interactive. Conversant.    Assessment and Plan: *** Physical exam today.  Encouraged healthy diet and exercise routine Whenever lab work as above Signed Abbe Amsterdam, MD

## 2022-06-08 ENCOUNTER — Encounter: Payer: Medicare HMO | Admitting: Family Medicine

## 2022-06-09 NOTE — Patient Instructions (Incomplete)
It was great to see you again today! Recommend COVID booster if not done in the last 9 months or so  I ordered a bone density for you- please stop by imaging on the ground floor and schedule this at your convenience  For hand arthritis- voltaren gel, tylenol Ok to use ibuprofen/ aleve sometimes if you need it!   If you would like to see hand ortho, have x-rays, etc just let me know   Water exercise may be a good option that is easier on your hip

## 2022-06-11 ENCOUNTER — Encounter: Payer: Self-pay | Admitting: Family Medicine

## 2022-06-11 ENCOUNTER — Telehealth: Payer: Self-pay | Admitting: Family Medicine

## 2022-06-11 ENCOUNTER — Ambulatory Visit (INDEPENDENT_AMBULATORY_CARE_PROVIDER_SITE_OTHER): Payer: Medicare HMO | Admitting: Family Medicine

## 2022-06-11 VITALS — BP 127/65 | HR 65 | Temp 97.8°F | Resp 16 | Ht 64.0 in | Wt 157.0 lb

## 2022-06-11 DIAGNOSIS — Z0000 Encounter for general adult medical examination without abnormal findings: Secondary | ICD-10-CM

## 2022-06-11 DIAGNOSIS — M19041 Primary osteoarthritis, right hand: Secondary | ICD-10-CM | POA: Diagnosis not present

## 2022-06-11 DIAGNOSIS — N289 Disorder of kidney and ureter, unspecified: Secondary | ICD-10-CM

## 2022-06-11 DIAGNOSIS — I10 Essential (primary) hypertension: Secondary | ICD-10-CM | POA: Diagnosis not present

## 2022-06-11 DIAGNOSIS — E785 Hyperlipidemia, unspecified: Secondary | ICD-10-CM | POA: Diagnosis not present

## 2022-06-11 DIAGNOSIS — M8580 Other specified disorders of bone density and structure, unspecified site: Secondary | ICD-10-CM

## 2022-06-11 DIAGNOSIS — M19042 Primary osteoarthritis, left hand: Secondary | ICD-10-CM | POA: Diagnosis not present

## 2022-06-11 DIAGNOSIS — G43109 Migraine with aura, not intractable, without status migrainosus: Secondary | ICD-10-CM

## 2022-06-11 MED ORDER — AMLODIPINE BESYLATE 5 MG PO TABS: 5.0000 mg | ORAL_TABLET | Freq: Every day | ORAL | 3 refills | Status: DC

## 2022-06-11 MED ORDER — ROSUVASTATIN CALCIUM 20 MG PO TABS: 20.0000 mg | ORAL_TABLET | Freq: Every day | ORAL | 3 refills | Status: DC

## 2022-06-11 MED ORDER — LOSARTAN POTASSIUM 50 MG PO TABS: 50.0000 mg | ORAL_TABLET | Freq: Every day | ORAL | 3 refills | Status: DC

## 2022-06-11 NOTE — Telephone Encounter (Signed)
Please advise 

## 2022-06-11 NOTE — Telephone Encounter (Signed)
Patient stopped by check out to set up a lab appointment only but no future labs were ordered. Patient stated she was told to come back in 6 months only for lab not a follow up with PCP. Appointment was made, please add future orders.

## 2022-06-11 NOTE — Telephone Encounter (Signed)
Labs ordered.

## 2022-06-12 ENCOUNTER — Telehealth (HOSPITAL_BASED_OUTPATIENT_CLINIC_OR_DEPARTMENT_OTHER): Payer: Self-pay

## 2022-06-15 ENCOUNTER — Encounter: Payer: Self-pay | Admitting: Family Medicine

## 2022-06-15 ENCOUNTER — Ambulatory Visit (HOSPITAL_BASED_OUTPATIENT_CLINIC_OR_DEPARTMENT_OTHER)
Admission: RE | Admit: 2022-06-15 | Discharge: 2022-06-15 | Disposition: A | Discharge status: Home / Self Care | Payer: Medicare HMO | Source: Ambulatory Visit | Attending: Family Medicine | Admitting: Family Medicine

## 2022-06-15 DIAGNOSIS — G43109 Migraine with aura, not intractable, without status migrainosus: Secondary | ICD-10-CM | POA: Insufficient documentation

## 2022-06-15 DIAGNOSIS — I6523 Occlusion and stenosis of bilateral carotid arteries: Secondary | ICD-10-CM | POA: Diagnosis not present

## 2022-06-15 DIAGNOSIS — M8580 Other specified disorders of bone density and structure, unspecified site: Secondary | ICD-10-CM

## 2022-06-15 DIAGNOSIS — M8588 Other specified disorders of bone density and structure, other site: Secondary | ICD-10-CM | POA: Diagnosis not present

## 2022-06-15 DIAGNOSIS — Z780 Asymptomatic menopausal state: Secondary | ICD-10-CM | POA: Diagnosis not present

## 2022-06-15 MED ORDER — IBANDRONATE SODIUM 150 MG PO TABS
150.0000 mg | ORAL_TABLET | ORAL | 3 refills | Status: DC
Start: 2022-06-15 — End: 2023-06-16

## 2022-06-24 DIAGNOSIS — H524 Presbyopia: Secondary | ICD-10-CM | POA: Diagnosis not present

## 2022-07-03 ENCOUNTER — Telehealth: Payer: Self-pay | Admitting: Family Medicine

## 2022-07-03 NOTE — Telephone Encounter (Signed)
Copied from CRM [id number]. Topic: Medicare AWV >> Jul 03, 2022  3:09 PM Payton Doughty wrote: Reason for CRM: N/A 07/03/2022(both#)  NO VOICEMAIL  Verlee Rossetti; Care Guide Ambulatory Clinical Support [place] [facility] Direct Dial: [phone]

## 2022-07-14 DIAGNOSIS — Z0100 Encounter for examination of eyes and vision without abnormal findings: Secondary | ICD-10-CM | POA: Diagnosis not present

## 2022-07-28 DIAGNOSIS — H02831 Dermatochalasis of right upper eyelid: Secondary | ICD-10-CM | POA: Diagnosis not present

## 2022-07-28 DIAGNOSIS — H25013 Cortical age-related cataract, bilateral: Secondary | ICD-10-CM | POA: Diagnosis not present

## 2022-07-28 DIAGNOSIS — H2513 Age-related nuclear cataract, bilateral: Secondary | ICD-10-CM | POA: Diagnosis not present

## 2022-07-28 DIAGNOSIS — H02834 Dermatochalasis of left upper eyelid: Secondary | ICD-10-CM | POA: Diagnosis not present

## 2022-09-21 DIAGNOSIS — L570 Actinic keratosis: Secondary | ICD-10-CM | POA: Diagnosis not present

## 2022-09-21 DIAGNOSIS — D1801 Hemangioma of skin and subcutaneous tissue: Secondary | ICD-10-CM | POA: Diagnosis not present

## 2022-09-21 DIAGNOSIS — L814 Other melanin hyperpigmentation: Secondary | ICD-10-CM | POA: Diagnosis not present

## 2022-09-21 DIAGNOSIS — L820 Inflamed seborrheic keratosis: Secondary | ICD-10-CM | POA: Diagnosis not present

## 2022-09-21 DIAGNOSIS — L821 Other seborrheic keratosis: Secondary | ICD-10-CM | POA: Diagnosis not present

## 2022-11-25 DIAGNOSIS — H53483 Generalized contraction of visual field, bilateral: Secondary | ICD-10-CM | POA: Diagnosis not present

## 2022-11-25 DIAGNOSIS — H02831 Dermatochalasis of right upper eyelid: Secondary | ICD-10-CM | POA: Diagnosis not present

## 2022-11-25 DIAGNOSIS — H57813 Brow ptosis, bilateral: Secondary | ICD-10-CM | POA: Diagnosis not present

## 2022-11-25 DIAGNOSIS — H0279 Other degenerative disorders of eyelid and periocular area: Secondary | ICD-10-CM | POA: Diagnosis not present

## 2022-11-25 DIAGNOSIS — Z01818 Encounter for other preprocedural examination: Secondary | ICD-10-CM | POA: Diagnosis not present

## 2022-11-25 DIAGNOSIS — H02834 Dermatochalasis of left upper eyelid: Secondary | ICD-10-CM | POA: Diagnosis not present

## 2022-12-02 IMAGING — MG MM DIGITAL SCREENING BILAT W/ TOMO AND CAD
8 series · 9 of 24 positions shown · non-contrast
Comparison: Previous exam(s).

CLINICAL DATA: Screening.

EXAM:
DIGITAL SCREENING BILATERAL MAMMOGRAM WITH TOMOSYNTHESIS AND CAD
TECHNIQUE: Bilateral screening digital craniocaudal and mediolateral oblique
mammograms were obtained. Bilateral screening digital breast
tomosynthesis was performed. The images were evaluated with
computer-aided detection.

[R MLO synth-2D]
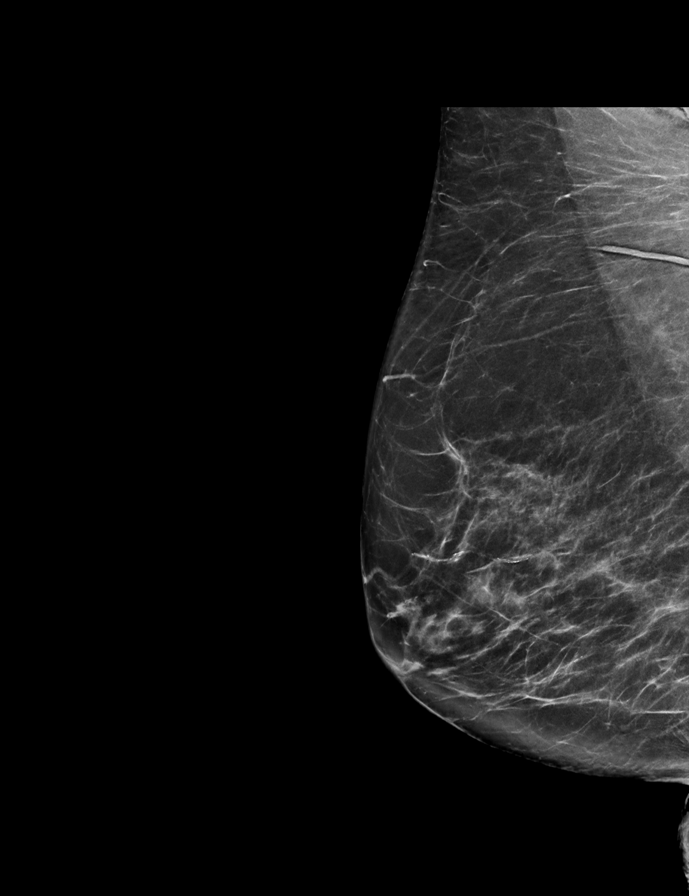

[L CC synth-2D]
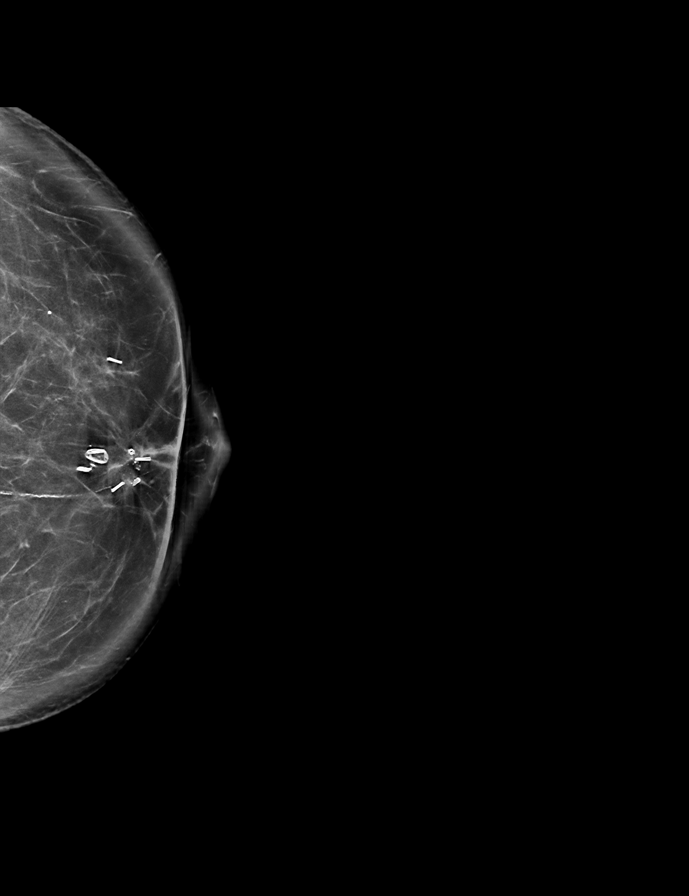

[L MLO synth-2D]
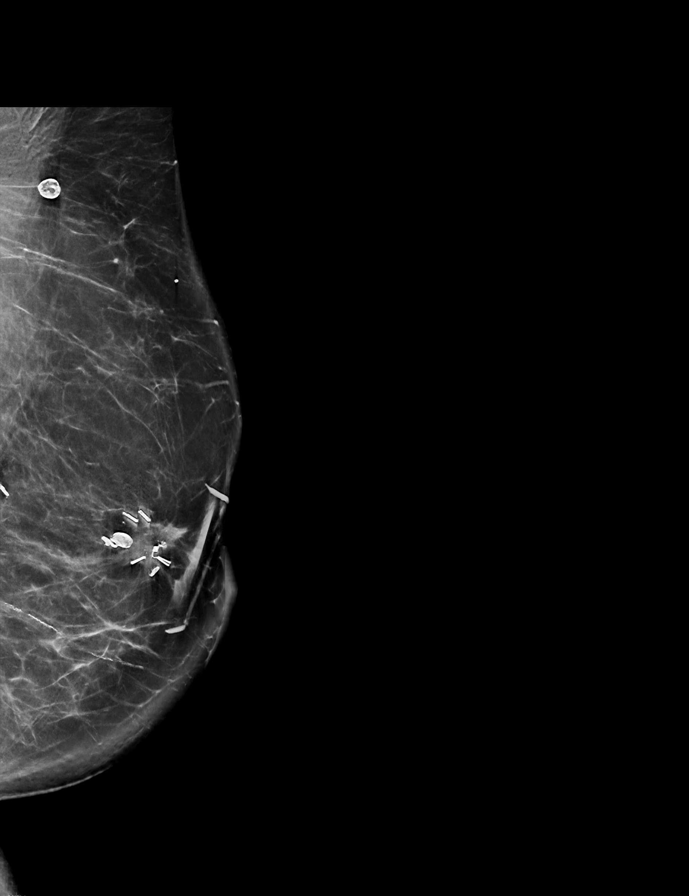

[R CC synth-2D]
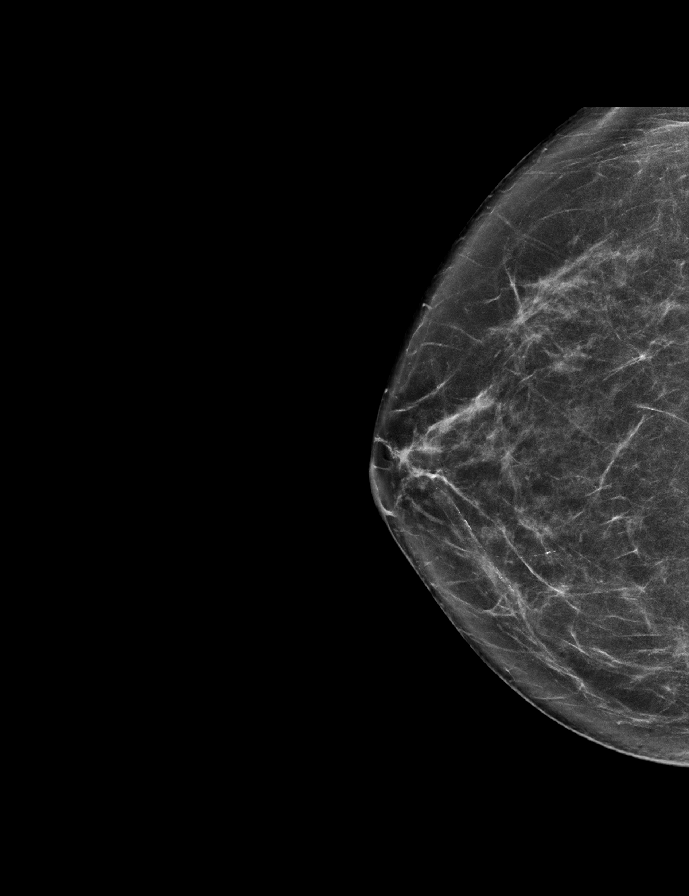

[L CC tomo · 2 of 88 frames shown]
[frame 29/88]
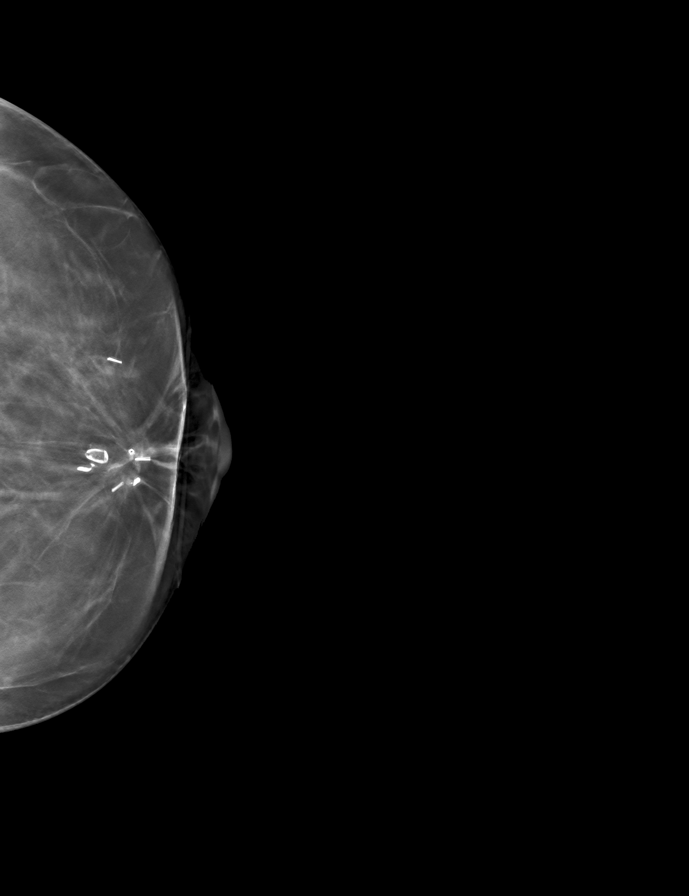
[frame 45/88]
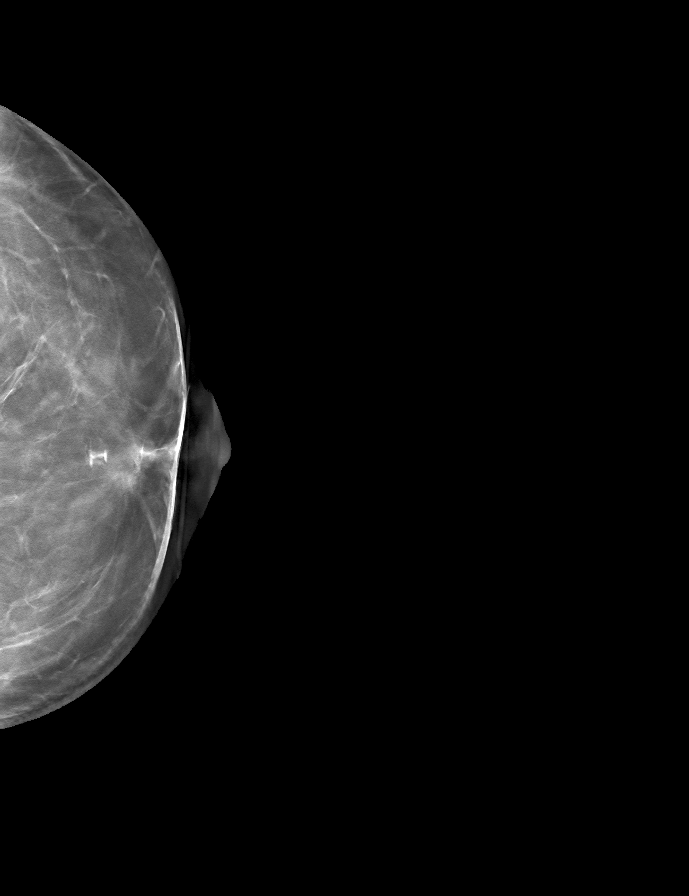

[R CC tomo · tomo slice 35/68.0]
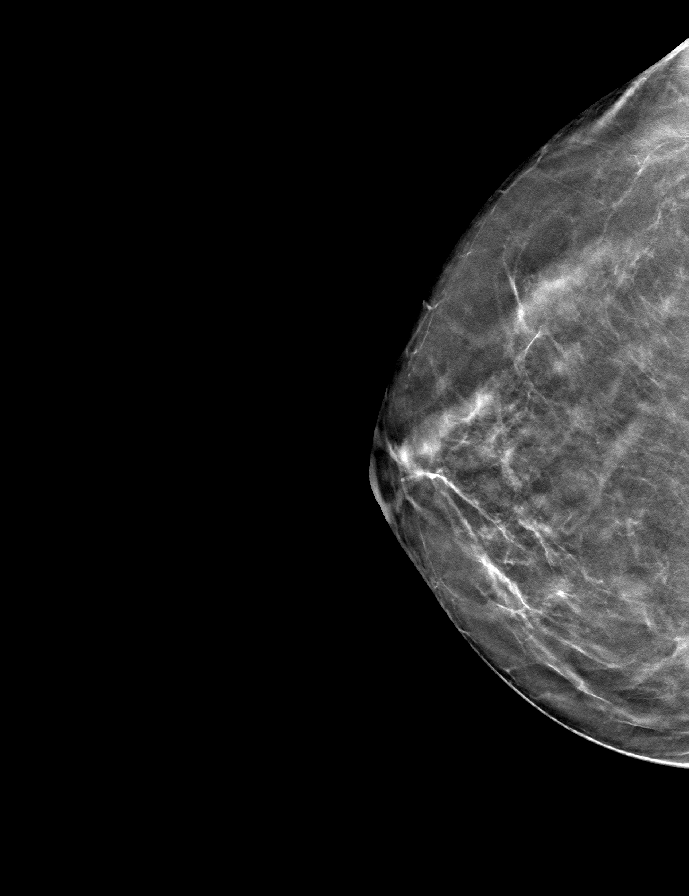

[L MLO tomo · tomo slice 43/85.0]
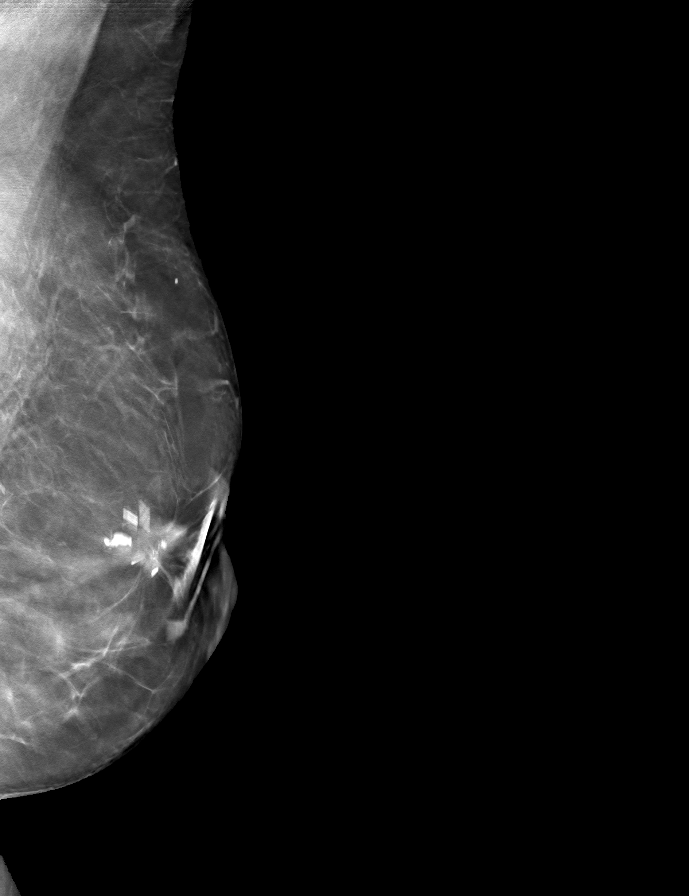

[R MLO tomo · tomo slice 37/72.0]
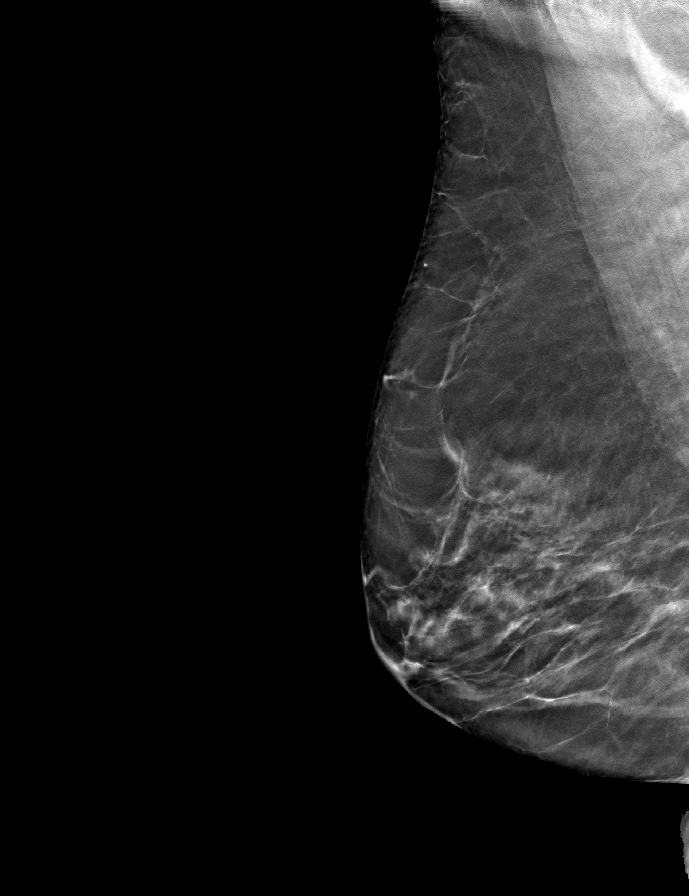

[9 of 24 positions shown; findings below may reference images not displayed]

ACR Breast Density Category b: There are scattered areas of
fibroglandular density.
FINDINGS: There are no findings suspicious for malignancy.
IMPRESSION: No mammographic evidence of malignancy. A result letter of this
screening mammogram will be mailed directly to the patient.

RECOMMENDATION:
Screening mammogram in one year. (Code:51-O-LD2)

BI-RADS CATEGORY  1: Negative.

## 2022-12-08 ENCOUNTER — Ambulatory Visit (INDEPENDENT_AMBULATORY_CARE_PROVIDER_SITE_OTHER): Payer: Medicare HMO | Admitting: *Deleted

## 2022-12-08 DIAGNOSIS — Z0000 Encounter for general adult medical examination without abnormal findings: Secondary | ICD-10-CM

## 2022-12-08 NOTE — Progress Notes (Signed)
Subjective:   Leslie Crawford is a 72 y.o. female who presents for Medicare Annual (Subsequent) preventive examination.  Visit Complete: Virtual I connected with  Vandana Snelling on 12/08/22 by a audio enabled telemedicine application and verified that I am speaking with the correct person using two identifiers.  Patient Location: Home  Provider Location: Office/Clinic  I discussed the limitations of evaluation and management by telemedicine. The patient expressed understanding and agreed to proceed.  Vital Signs: Because this visit was a virtual/telehealth visit, some criteria may be missing or patient reported. Any vitals not documented were not able to be obtained and vitals that have been documented are patient reported.  Patient Medicare AWV questionnaire was completed by the patient on 12/07/22; I have confirmed that all information answered by patient is correct and no changes since this date.  Cardiac Risk Factors include: advanced age (>2men, >71 women);dyslipidemia;hypertension     Objective:    There were no vitals filed for this visit. There is no height or weight on file to calculate BMI.     12/08/2022    2:30 PM 12/03/2020   11:04 AM 06/08/2017    3:22 PM  Advanced Directives  Does Patient Have a Medical Advance Directive? Yes Yes No  Type of Estate agent of [place];Living will Healthcare Power of [place];Living will   Does patient want to make changes to medical advance directive? No - Patient declined    Copy of Healthcare Power of Attorney in Chart? No - copy requested No - copy requested     Current Medications (verified) Outpatient Encounter Medications as of 12/08/2022  Medication Sig   amLODipine (NORVASC) 5 MG tablet Take 1 tablet (5 mg total) by mouth daily.   [employer] MAGNESIUM CITRATE PO Take 500 mg by mouth.   ibandronate (BONIVA) 150 MG tablet Take 1 tablet (150 mg total) by mouth every 30 (thirty) days. Take in  the morning with a full glass of water, on an empty stomach, and do not take anything else by mouth or lie down for the next 30 min.   losartan (COZAAR) 50 MG tablet Take 1 tablet (50 mg total) by mouth daily.   OVER THE COUNTER MEDICATION Take 1 tablet by mouth every evening.   rosuvastatin (CRESTOR) 20 MG tablet Take 1 tablet (20 mg total) by mouth daily.   [DISCONTINUED] Cholecalciferol 25 MCG (1000 UT) tablet Take 1,000 Units by mouth daily.   [DISCONTINUED] vitamin B-12 (CYANOCOBALAMIN) 1000 MCG tablet Take 1,000 mcg by mouth every other day.   No facility-administered encounter medications on file as of 12/08/2022.    Allergies (verified) Topamax [topiramate]   History: Past Medical History:  Diagnosis Date   Allergy    Anemia    Anxiety    Breast cancer (HCC) 2010   S/P Chemo/RadTx- Dr. Pierce Crane   Depression    GERD (gastroesophageal reflux disease)    past hx- not current   Hyperlipidemia    Hypertension    Osteopenia    Osteoporosis    improved- no meds-    Personal history of chemotherapy    Personal history of radiation therapy    Past Surgical History:  Procedure Laterality Date   ABDOMINAL HYSTERECTOMY     Total hysterectomy with BSO   BREAST BIOPSY Left 2019   benign   BREAST LUMPECTOMY Left 10/29/2008   Negative markers- 1 positive node   COLONOSCOPY     POLYPECTOMY     SEPTOPLASTY  SHOULDER SURGERY     TONSILLECTOMY AND ADENOIDECTOMY     Family History  Problem Relation Age of Onset   Cancer Mother        Throat cancer   Esophageal cancer Mother    Cancer Father        Stomach cancer   Stomach cancer Father    Early death Maternal Grandmother    Pneumonia Maternal Grandmother    Heart attack Maternal Grandfather    Heart attack Paternal Grandfather    Colon cancer Neg Hx    Colon polyps Neg Hx    Rectal cancer Neg Hx    Social History   Socioeconomic History   Marital status: Married    Spouse name: Not on file   Number of  children: Not on file   Years of education: Not on file   Highest education level: Not on file  Occupational History   Not on file  Tobacco Use   Smoking status: Former   Smokeless tobacco: Never  Vaping Use   Vaping status: Never Used  Substance and Sexual Activity   Alcohol use: Yes    Alcohol/week: 5.0 standard drinks of alcohol    Types: 5 Cans of beer per week   Drug use: No   Sexual activity: Yes    Birth control/protection: Surgical  Other Topics Concern   Not on file  Social History Narrative   Not on file   Social Determinants of Health   Financial Resource Strain: Low Risk  (12/07/2022)   Overall Financial Resource Strain (CARDIA)    Difficulty of Paying Living Expenses: Not hard at all  Food Insecurity: No Food Insecurity (12/07/2022)   Hunger Vital Sign    Worried About Running Out of Food in the Last Year: Never true    Ran Out of Food in the Last Year: Never true  Transportation Needs: No Transportation Needs (12/07/2022)   PRAPARE - Administrator, Civil Service (Medical): No    Lack of Transportation (Non-Medical): No  Physical Activity: Sufficiently Active (12/07/2022)   Exercise Vital Sign    Days of Exercise per Week: 5 days    Minutes of Exercise per Session: 60 min  Stress: No Stress Concern Present (12/07/2022)   [employer] of Occupational Health - Occupational Stress Questionnaire    Feeling of Stress : Not at all  Social Connections: Unknown (12/07/2022)   Social Connection and Isolation Panel [NHANES]    Frequency of Communication with Friends and Family: More than three times a week    Frequency of Social Gatherings with Friends and Family: Three times a week    Attends Religious Services: Not on file    Active Member of Clubs or Organizations: Yes    Attends Banker Meetings: Not on file    Marital Status: Married    Tobacco Counseling Counseling given: Not Answered   Clinical Intake:  Pre-visit  preparation completed: Yes  Pain : No/denies pain  Nutritional Risks: None Diabetes: No  How often do you need to have someone help you when you read instructions, pamphlets, or other written materials from your doctor or pharmacy?: 1 - Never  Interpreter Needed?: No  Information entered by :: Donne Anon, CMA   Activities of Daily Living    12/07/2022    2:30 PM  In your present state of health, do you have any difficulty performing the following activities:  Hearing? 0  Vision? 0  Difficulty concentrating or making decisions?  0  Walking or climbing stairs? 0  Dressing or bathing? 0  Doing errands, shopping? 0  Preparing Food and eating ? N  Using the Toilet? N  In the past six months, have you accidently leaked urine? N  Do you have problems with loss of bowel control? N  Managing your Medications? N  Managing your Finances? N  Housekeeping or managing your Housekeeping? N    Patient Care Team: Copland, Gwenlyn Found, MD as PCP - General (Family Medicine) Rollene Rotunda, MD as PCP - Cardiology (Cardiology)  Indicate any recent Medical Services you may have received from other than [facility] providers in the past year (date may be approximate).     Assessment:   This is a routine wellness examination for [place].  Hearing/Vision screen No results found.   Goals Addressed   None    Depression Screen    12/08/2022    2:31 PM 06/11/2022    9:48 AM 06/02/2021    9:43 AM 12/03/2020   11:06 AM 07/11/2020    1:25 PM 04/22/2020   10:20 AM 05/25/2016    9:06 AM  PHQ 2/9 Scores  PHQ - 2 Score 0 0 0 0 0 0 0  PHQ- 9 Score  0    1   Exception Documentation       Patient refusal    Fall Risk    12/07/2022    2:30 PM 06/11/2022    9:48 AM 06/02/2021    9:43 AM 12/03/2020   11:05 AM 07/11/2020    1:25 PM  Fall Risk   Falls in the past year? 0 0 0 0 0  Number falls in past yr: 0 0 0 0   Injury with Fall? 0 0 0 0   Risk for fall due to : No Fall Risks No Fall Risks     Follow  up Falls evaluation completed Falls evaluation completed  Falls prevention discussed     MEDICARE RISK AT HOME: Medicare Risk at Home Any stairs in or around the home?: Yes If so, are there any without handrails?: No Home free of loose throw rugs in walkways, pet beds, electrical cords, etc?: No Adequate lighting in your home to reduce risk of falls?: Yes Life alert?: No Use of a cane, walker or w/c?: No Grab bars in the bathroom?: No Shower chair or bench in shower?: No Elevated toilet seat or a handicapped toilet?: No  TIMED UP AND GO:  Was the test performed?  No    Cognitive Function:        12/08/2022    2:33 PM  6CIT Screen  What Year? 0 points  What month? 0 points  What time? 0 points  Count back from 20 0 points  Months in reverse 0 points  Repeat phrase 0 points  Total Score 0 points    Immunizations Immunization History  Administered Date(s) Administered   Influenza Inj Mdck Quad With Preservative 11/13/2017   Influenza, High Dose Seasonal PF 10/27/2022   Influenza,inj,Quad PF,6+ Mos 10/16/2018   Influenza-Unspecified 11/14/2019, 11/10/2020   PFIZER(Purple Top)SARS-COV-2 Vaccination 02/16/2019, 03/09/2019, 11/14/2019, 06/22/2020   Pfizer Covid-19 Vaccine Bivalent Booster 60yrs & up 10/24/2020, 10/27/2022   Pneumococcal Conjugate-13 05/25/2016   Pneumococcal Polysaccharide-23 07/13/2019   Td 07/01/2007   Tdap 04/01/2017   Zoster Recombinant(Shingrix) 05/25/2016, 09/30/2016    TDAP status: Up to date  Flu Vaccine status: Up to date  Pneumococcal vaccine status: Up to date  Covid-19 vaccine status: Information provided on  how to obtain vaccines.   Qualifies for Shingles Vaccine? Yes   Zostavax completed No   Shingrix Completed?: Yes  Screening Tests Health Maintenance  Topic Date Due   Medicare Annual Wellness (AWV)  12/03/2021   COVID-19 Vaccine (7 - 2023-24 season) 12/22/2022   MAMMOGRAM  05/17/2024   DTaP/Tdap/Td (3 - Td or Tdap)  04/02/2027   Colonoscopy  02/26/2028   Pneumonia Vaccine 81+ Years old  Completed   INFLUENZA VACCINE  Completed   DEXA SCAN  Completed   Hepatitis C Screening  Completed   Zoster Vaccines- Shingrix  Completed   HPV VACCINES  Aged Out    Health Maintenance  Health Maintenance Due  Topic Date Due   Medicare Annual Wellness (AWV)  12/03/2021    Colorectal cancer screening: Type of screening: Colonoscopy. Completed 02/25/18. Repeat every 10 years  Mammogram status: Completed 05/18/22. Repeat every year  Bone Density status: Completed 06/15/22. Results reflect: Bone density results: OSTEOPENIA. Repeat every 2 years.  Lung Cancer Screening: (Low Dose CT Chest recommended if Age 34-80 years, 20 pack-year currently smoking OR have quit w/in 15years.) does not qualify.   Additional Screening:  Hepatitis C Screening: does qualify; Completed 05/25/16  Vision Screening: Recommended annual ophthalmology exams for early detection of glaucoma and other disorders of the eye. Is the patient up to date with their annual eye exam?  Yes  Who is the provider or what is the name of the office in which the patient attends annual eye exams? Dr. Carlynn Purl - [facility] If pt is not established with a provider, would they like to be referred to a provider to establish care? No .   Dental Screening: Recommended annual dental exams for proper oral hygiene  Diabetic Foot Exam: N/a  Community Resource Referral / Chronic Care Management: CRR required this visit?  No   CCM required this visit?  No     Plan:     I have personally reviewed and noted the following in the patient's chart:   Medical and social history Use of alcohol, tobacco or illicit drugs  Current medications and supplements including opioid prescriptions. Patient is not currently taking opioid prescriptions. Functional ability and status Nutritional status Physical activity Advanced directives List of other  physicians Hospitalizations, surgeries, and ER visits in previous 12 months Vitals Screenings to include cognitive, depression, and falls Referrals and appointments  In addition, I have reviewed and discussed with patient certain preventive protocols, quality metrics, and best practice recommendations. A written personalized care plan for preventive services as well as general preventive health recommendations were provided to patient.     Donne Anon, CMA   12/08/2022   After Visit Summary: (MyChart) Due to this being a telephonic visit, the after visit summary with patients personalized plan was offered to patient via MyChart   Nurse Notes: None

## 2022-12-08 NOTE — Patient Instructions (Signed)
[name] , Thank you for taking time to come for your Medicare Wellness Visit. I appreciate your ongoing commitment to your health goals. Please review the following plan we discussed and let me know if I can assist you in the future.     This is a list of the screening recommended for you and due dates:  Health Maintenance  Topic Date Due   COVID-19 Vaccine (7 - 2023-24 season) 12/22/2022   Medicare Annual Wellness Visit  12/08/2023   Mammogram  05/17/2024   DTaP/Tdap/Td vaccine (3 - Td or Tdap) 04/02/2027   Colon Cancer Screening  02/26/2028   Pneumonia Vaccine  Completed   Flu Shot  Completed   DEXA scan (bone density measurement)  Completed   Hepatitis C Screening  Completed   Zoster (Shingles) Vaccine  Completed   HPV Vaccine  Aged Out     Next appointment: Follow up in one year for your annual wellness visit.   Preventive Care 45 Years and Older, Female Preventive care refers to lifestyle choices and visits with your health care provider that can promote health and wellness. What does preventive care include? A yearly physical exam. This is also called an annual well check. Dental exams once or twice a year. Routine eye exams. Ask your health care provider how often you should have your eyes checked. Personal lifestyle choices, including: Daily care of your teeth and gums. Regular physical activity. Eating a healthy diet. Avoiding tobacco and drug use. Limiting alcohol use. Practicing safe sex. Taking low-dose aspirin every day. Taking vitamin and mineral supplements as recommended by your health care provider. What happens during an annual well check? The services and screenings done by your health care provider during your annual well check will depend on your age, overall health, lifestyle risk factors, and family history of disease. Counseling  Your health care provider may ask you questions about your: Alcohol use. Tobacco use. Drug use. Emotional  well-being. Home and relationship well-being. Sexual activity. Eating habits. History of falls. Memory and ability to understand (cognition). Work and work Astronomer. Reproductive health. Screening  You may have the following tests or measurements: Height, weight, and BMI. Blood pressure. Lipid and cholesterol levels. These may be checked every 5 years, or more frequently if you are over 19 years old. Skin check. Lung cancer screening. You may have this screening every year starting at age 53 if you have a 30-pack-year history of smoking and currently smoke or have quit within the past 15 years. Fecal occult blood test (FOBT) of the stool. You may have this test every year starting at age 75. Flexible sigmoidoscopy or colonoscopy. You may have a sigmoidoscopy every 5 years or a colonoscopy every 10 years starting at age 40. Hepatitis C blood test. Hepatitis B blood test. Sexually transmitted disease (STD) testing. Diabetes screening. This is done by checking your blood sugar (glucose) after you have not eaten for a while (fasting). You may have this done every 1-3 years. Bone density scan. This is done to screen for osteoporosis. You may have this done starting at age 35. Mammogram. This may be done every 1-2 years. Talk to your health care provider about how often you should have regular mammograms. Talk with your health care provider about your test results, treatment options, and if necessary, the need for more tests. Vaccines  Your health care provider may recommend certain vaccines, such as: Influenza vaccine. This is recommended every year. Tetanus, diphtheria, and acellular pertussis (Tdap, Td)  vaccine. You may need a Td booster every 10 years. Zoster vaccine. You may need this after age 59. Pneumococcal 13-valent conjugate (PCV13) vaccine. One dose is recommended after age 50. Pneumococcal polysaccharide (PPSV23) vaccine. One dose is recommended after age 22. Talk to your  health care provider about which screenings and vaccines you need and how often you need them. This information is not intended to replace advice given to you by your health care provider. Make sure you discuss any questions you have with your health care provider. Document Released: 02/08/2015 Document Revised: 10/02/2015 Document Reviewed: 11/13/2014 Elsevier Interactive Patient Education  2017 [employer].  Fall Prevention in the Home Falls can cause injuries. They can happen to people of all ages. There are many things you can do to make your home safe and to help prevent falls. What can I do on the outside of my home? Regularly fix the edges of walkways and driveways and fix any cracks. Remove anything that might make you trip as you walk through a door, such as a raised step or threshold. Trim any bushes or trees on the path to your home. Use bright outdoor lighting. Clear any walking paths of anything that might make someone trip, such as rocks or tools. Regularly check to see if handrails are loose or broken. Make sure that both sides of any steps have handrails. Any raised decks and porches should have guardrails on the edges. Have any leaves, snow, or ice cleared regularly. Use sand or salt on walking paths during winter. Clean up any spills in your garage right away. This includes oil or grease spills. What can I do in the bathroom? Use night lights. Install grab bars by the toilet and in the tub and shower. Do not use towel bars as grab bars. Use non-skid mats or decals in the tub or shower. If you need to sit down in the shower, use a plastic, non-slip stool. Keep the floor dry. Clean up any water that spills on the floor as soon as it happens. Remove soap buildup in the tub or shower regularly. Attach bath mats securely with double-sided non-slip rug tape. Do not have throw rugs and other things on the floor that can make you trip. What can I do in the bedroom? Use night  lights. Make sure that you have a light by your bed that is easy to reach. Do not use any sheets or blankets that are too big for your bed. They should not hang down onto the floor. Have a firm chair that has side arms. You can use this for support while you get dressed. Do not have throw rugs and other things on the floor that can make you trip. What can I do in the kitchen? Clean up any spills right away. Avoid walking on wet floors. Keep items that you use a lot in easy-to-reach places. If you need to reach something above you, use a strong step stool that has a grab bar. Keep electrical cords out of the way. Do not use floor polish or wax that makes floors slippery. If you must use wax, use non-skid floor wax. Do not have throw rugs and other things on the floor that can make you trip. What can I do with my stairs? Do not leave any items on the stairs. Make sure that there are handrails on both sides of the stairs and use them. Fix handrails that are broken or loose. Make sure that handrails are as long  as the stairways. Check any carpeting to make sure that it is firmly attached to the stairs. Fix any carpet that is loose or worn. Avoid having throw rugs at the top or bottom of the stairs. If you do have throw rugs, attach them to the floor with carpet tape. Make sure that you have a light switch at the top of the stairs and the bottom of the stairs. If you do not have them, ask someone to add them for you. What else can I do to help prevent falls? Wear shoes that: Do not have high heels. Have rubber bottoms. Are comfortable and fit you well. Are closed at the toe. Do not wear sandals. If you use a stepladder: Make sure that it is fully opened. Do not climb a closed stepladder. Make sure that both sides of the stepladder are locked into place. Ask someone to hold it for you, if possible. Clearly mark and make sure that you can see: Any grab bars or handrails. First and last  steps. Where the edge of each step is. Use tools that help you move around (mobility aids) if they are needed. These include: Canes. Walkers. Scooters. Crutches. Turn on the lights when you go into a dark area. Replace any light bulbs as soon as they burn out. Set up your furniture so you have a clear path. Avoid moving your furniture around. If any of your floors are uneven, fix them. If there are any pets around you, be aware of where they are. Review your medicines with your doctor. Some medicines can make you feel dizzy. This can increase your chance of falling. Ask your doctor what other things that you can do to help prevent falls. This information is not intended to replace advice given to you by your health care provider. Make sure you discuss any questions you have with your health care provider. Document Released: 11/08/2008 Document Revised: 06/20/2015 Document Reviewed: 02/16/2014 Elsevier Interactive Patient Education  2017 [employer].

## 2022-12-14 ENCOUNTER — Other Ambulatory Visit (INDEPENDENT_AMBULATORY_CARE_PROVIDER_SITE_OTHER): Payer: Medicare HMO

## 2022-12-14 DIAGNOSIS — N289 Disorder of kidney and ureter, unspecified: Secondary | ICD-10-CM

## 2022-12-14 LAB — BASIC METABOLIC PANEL
BUN: 17 mg/dL (ref 6–23)
CO2: 26 meq/L (ref 19–32)
Calcium: 9.9 mg/dL (ref 8.4–10.5)
Chloride: 102 meq/L (ref 96–112)
Creatinine, Ser: 0.86 mg/dL (ref 0.40–1.20)
GFR: 67.05 mL/min (ref >60.00)
Glucose, Bld: 89 mg/dL (ref 70–99)
Potassium: 4.9 meq/L (ref 3.5–5.1)
Sodium: 138 meq/L (ref 135–145)

## 2022-12-28 DIAGNOSIS — H53483 Generalized contraction of visual field, bilateral: Secondary | ICD-10-CM | POA: Diagnosis not present

## 2023-04-05 ENCOUNTER — Other Ambulatory Visit: Payer: Self-pay | Admitting: Family Medicine

## 2023-04-05 DIAGNOSIS — Z1231 Encounter for screening mammogram for malignant neoplasm of breast: Secondary | ICD-10-CM

## 2023-04-30 ENCOUNTER — Encounter: Payer: Self-pay | Admitting: Family Medicine

## 2023-04-30 ENCOUNTER — Ambulatory Visit: Payer: Self-pay

## 2023-04-30 NOTE — Progress Notes (Signed)
 [facility] [address], [address] [place], [place] [address] [phone]  Date:  05/03/2023   Name:  Leslie Crawford   DOB:  12-Oct-1950   MRN:  [id number]  PCP:  Pearline Cables, MD    Chief Complaint: No chief complaint on file.   History of Present Illness:  Leslie Crawford is a 73 y.o. very pleasant female patient who presents with the following:  Pt seen today with concern of foot pain and lateral knee pain Last seen by myself in May of last year  She has history of hypertension, hyperlipidemia, breast cancer She is also been seen by neurology for ocular migraine  She had breast cancer over a decade ago, diagnosed in 2010.  She had surgery, chemo and radiation. She does continue regular mammogram   Last summer she did a long car drive and noted some pain in her RIGHT lateral leg just below the knee.  She thinks it actually got injured or strained on this 7 hour drive.  Since then it has bothered her off and on since then. Doing more walking tends to make it worse It hurts just distal to the lateral knee She is not having calf pain or swelling  In addition,her lateral left foot has hurt her for 3-4 weeks She is not aware of any injury It was swollen but this has gone away Hurts to walk on it   His son's fiance got really sick in December- she had a cardiac arrest.  She is actually doing much better,  she is not able to be left alone yet but she is making good progress  Patient Active Problem List   Diagnosis Date Noted   Dog scratch 08/25/2021   Cellulitis of right upper extremity 08/25/2021   Complete tear of left rotator cuff 01/16/2021   Pain in left shoulder 01/01/2021   Osteopenia 04/23/2020   Migraine 02/27/2019   High blood pressure 10/15/2016   Malignant neoplasm of female breast (HCC) 10/26/2008   Anxiety state 10/26/2008   HYPERLIPIDEMIA 07/17/2008   GERD 07/01/2007   Disorder of bone  and cartilage 07/01/2007   INSOMNIA-SLEEP DISORDER-UNSPEC 07/01/2007    Past Medical History:  Diagnosis Date   Allergy    Anemia    Anxiety    Breast cancer (HCC) 2010   S/P Chemo/RadTx- Dr. Pierce Crane   Depression    GERD (gastroesophageal reflux disease)    past hx- not current   Hyperlipidemia    Hypertension    Osteopenia    Osteoporosis    improved- no meds-    Personal history of chemotherapy    Personal history of radiation therapy     Past Surgical History:  Procedure Laterality Date   ABDOMINAL HYSTERECTOMY     Total hysterectomy with BSO   BREAST BIOPSY Left 2019   benign   BREAST LUMPECTOMY Left 10/29/2008   Negative markers- 1 positive node   COLONOSCOPY     POLYPECTOMY     SEPTOPLASTY     SHOULDER SURGERY     TONSILLECTOMY AND ADENOIDECTOMY      Social History   Tobacco Use   Smoking status: Former   Smokeless tobacco: Never  Vaping Use   Vaping status: Never Used  Substance Use Topics   Alcohol use: Yes    Alcohol/week: 5.0 standard drinks of alcohol    Types: 5 Cans of beer per week   Drug use:  No    Family History  Problem Relation Age of Onset   Cancer Mother        Throat cancer   Esophageal cancer Mother    Cancer Father        Stomach cancer   Stomach cancer Father    Early death Maternal Grandmother    Pneumonia Maternal Grandmother    Heart attack Maternal Grandfather    Heart attack Paternal Grandfather    Colon cancer Neg Hx    Colon polyps Neg Hx    Rectal cancer Neg Hx     Allergies  Allergen Reactions   Topamax [Topiramate] Other (See Comments)    Decreased cognitive function    Medication list has been reviewed and updated.  Current Outpatient Medications on File Prior to Visit  Medication Sig Dispense Refill   amLODipine (NORVASC) 5 MG tablet Take 1 tablet (5 mg total) by mouth daily. 90 tablet 3   [facility] MAGNESIUM CITRATE PO Take 500 mg by mouth.     ibandronate (BONIVA) 150 MG tablet Take 1 tablet (150 mg  total) by mouth every 30 (thirty) days. Take in the morning with a full glass of water, on an empty stomach, and do not take anything else by mouth or lie down for the next 30 min. 3 tablet 3   losartan (COZAAR) 50 MG tablet Take 1 tablet (50 mg total) by mouth daily. 90 tablet 3   OVER THE COUNTER MEDICATION Take 1 tablet by mouth every evening.     rosuvastatin (CRESTOR) 20 MG tablet Take 1 tablet (20 mg total) by mouth daily. 90 tablet 3   No current facility-administered medications on file prior to visit.    Review of Systems:  As per HPI- otherwise negative.   Physical Examination: Vitals:   05/03/23 0915  BP: 138/63  Pulse: 65  Resp: 16  Temp: 98.6 F (37 C)  SpO2: 99%   Vitals:   05/03/23 0915  Weight: 156 lb (70.8 kg)  Height: 5\' 4"  (1.626 m)   Body mass index is 26.78 kg/m. Ideal Body Weight: Weight in (lb) to have BMI = 25: 145.3  GEN: no acute distress.  Minimally overweight, looks well HEENT: Atraumatic, Normocephalic.  Ears and Nose: No external deformity. CV: RRR, No M/G/R. No JVD. No thrill. No extra heart sounds. PULM: CTA B, no wheezes, crackles, rhonchi. No retractions. No resp. distress. No accessory muscle use. EXTR: No c/c/e PSYCH: Normally interactive. Conversant.  Right knee: There is tenderness on the lateral lower leg just distal to the knee Knee is stable, no swelling or effusion, no heat.  Normal lower extremity strength Suspect she may have injured her lateral collateral ligament or the anterior tibialis tendon  Left foot: She is tender over the proximal to mid fifth metatarsal.  No swelling or bruising.  Otherwise foot and ankle is normal Assessment and Plan: Pain in joint of left foot - Plan: DG Foot Complete Left  Pain in lateral portion of right knee - Plan: Ambulatory referral to Orthopedic Surgery  Patient seen today with a couple of concerns.  Her left foot is hurting, we will obtain plain x-rays today to rule out any fracture of  the fifth metatarsal.  She has had knee pain now for almost a year, referral to orthopedics for further assessment and treatment.  Signed Abbe Amsterdam, MD  Reviewed her foot films myself while we await radiology overread- did not noted any fracture  Message to pt

## 2023-04-30 NOTE — Telephone Encounter (Signed)
 Please see below. Pt declines seeing anyone else or going to UC/ER.

## 2023-04-30 NOTE — Patient Instructions (Addendum)
 Good to see you again today!  I am so glad your future DIL is making progress  Please stop by imaging on the ground floor to get an x-ray of your left foot I will set you up with orthopedics to look at your knee and your foot if needed  Looking forward to seeing you again for your physical coming up!

## 2023-04-30 NOTE — Telephone Encounter (Signed)
  Chief Complaint: L foot, R leg Symptoms: pain, worse recently Frequency: since Aug, started after 14 hour drive Pertinent Negatives: Patient denies redness, swelling, difficulty breathing, CP, SOB Disposition: [] ED /[x] Urgent Care (no appt availability in office) / [] Appointment(In office/virtual)/ []  [facility]/ [] Home Care/ [x] Refused Recommended Disposition /[] [facility]/ []  Follow-up with PCP Additional Notes: Pt states that she has had this pain in her R leg ongoing since a 14 hour drive in Aug. Pt states that it has progressively gotten worse. Pt states that recently it has been hurting worse with walking. Pt also c/o L foot pain, with swelling. Denies DM2. Pt advised that there was no appts today in clinic, pt refuses UC, refuses scheduling with any provider other than her PCP. Pt scheduled next avail appt with provider. Copied from CRM [id number]. Topic: Clinical - Red Word Triage >> Apr 30, 2023  2:18 PM Drema Balzarine wrote: Red Word that prompted transfer to Nurse Triage: Patient experiencing pain in left foot for 1 month 5/10 and pain in right leg since August 9/10 - says its not urgent and would like an appointment Reason for Disposition  [1] Thigh or calf pain AND [2] only 1 side AND [3] present > 1 hour (Exception: Chronic unchanged pain.)  Answer Assessment - Initial Assessment Questions 1. ONSET: "When did the pain start?"      R leg 2. LOCATION: "Where is the pain located?"      Outside of R leg 3. PAIN: "How bad is the pain?"    (Scale 1-10; or mild, moderate, severe)   -  MILD (1-3): doesn't interfere with normal activities    -  MODERATE (4-7): interferes with normal activities (e.g., work or school) or awakens from sleep, limping    -  SEVERE (8-10): excruciating pain, unable to do any normal activities, unable to walk     9 4. WORK OR EXERCISE: "Has there been any recent work or exercise that involved this part of the body?"      Has rested her leg the  last few days, states that her normal exercise is walking 5. CAUSE: "What do you think is causing the leg pain?"     unsure 6. OTHER SYMPTOMS: "Do you have any other symptoms?" (e.g., chest pain, back pain, breathing difficulty, swelling, rash, fever, numbness, weakness)     denies  Protocols used: Leg Pain-A-AH

## 2023-05-03 ENCOUNTER — Encounter: Payer: Self-pay | Admitting: Family Medicine

## 2023-05-03 ENCOUNTER — Ambulatory Visit (INDEPENDENT_AMBULATORY_CARE_PROVIDER_SITE_OTHER): Admitting: Family Medicine

## 2023-05-03 ENCOUNTER — Ambulatory Visit (HOSPITAL_BASED_OUTPATIENT_CLINIC_OR_DEPARTMENT_OTHER)
Admission: RE | Admit: 2023-05-03 | Discharge: 2023-05-03 | Disposition: A | Discharge status: Home / Self Care | Source: Ambulatory Visit | Attending: Family Medicine | Admitting: Family Medicine

## 2023-05-03 VITALS — BP 138/63 | HR 65 | Temp 98.6°F | Resp 16 | Ht 64.0 in | Wt 156.0 lb

## 2023-05-03 DIAGNOSIS — M25572 Pain in left ankle and joints of left foot: Secondary | ICD-10-CM

## 2023-05-03 DIAGNOSIS — M25561 Pain in right knee: Secondary | ICD-10-CM

## 2023-05-03 DIAGNOSIS — M19072 Primary osteoarthritis, left ankle and foot: Secondary | ICD-10-CM | POA: Diagnosis not present

## 2023-05-03 DIAGNOSIS — M79672 Pain in left foot: Secondary | ICD-10-CM | POA: Diagnosis not present

## 2023-05-03 DIAGNOSIS — M7732 Calcaneal spur, left foot: Secondary | ICD-10-CM | POA: Diagnosis not present

## 2023-05-05 ENCOUNTER — Encounter: Payer: Self-pay | Admitting: Family Medicine

## 2023-05-07 ENCOUNTER — Ambulatory Visit: Admitting: Family

## 2023-05-07 ENCOUNTER — Encounter: Payer: Self-pay | Admitting: Family

## 2023-05-07 ENCOUNTER — Other Ambulatory Visit: Payer: Self-pay

## 2023-05-07 DIAGNOSIS — M25561 Pain in right knee: Secondary | ICD-10-CM

## 2023-05-07 DIAGNOSIS — G8929 Other chronic pain: Secondary | ICD-10-CM

## 2023-05-07 MED ORDER — PREDNISONE 50 MG PO TABS: ORAL_TABLET | ORAL | 0 refills | Status: DC

## 2023-05-07 NOTE — Progress Notes (Signed)
 Office Visit Note   Patient: Leslie Crawford           Date of Birth: Apr 03, 1950           MRN: [id number] Visit Date: 05/07/2023              Requested by: Kaylee Partridge, MD [address] [place],  [place] [address] PCP: Kaylee Partridge, MD  Chief Complaint  Patient presents with   Right Knee - Pain, Follow-up   Left Foot - Pain, Follow-up   Right Leg - Pain      HPI: The patient is a 73 year old woman who is seen today for evaluation of right knee and left foot pain.  She has not had any associated injury both of these began last August.  The knee pain radiates down her lower leg she has had several weeks of having this episode she does complain of some burning and an associated knot with mild swelling on the lateral aspect of her knee she has not had any associated injuries she has not done any new movements no injury she can recall she has tried ibuprofen with minimal relief she is also tried ice to her knee with minimal relief  No loss of bowel or bladder.  No red flag symptoms.  No mechanical symptoms of the knee  Assessment & Plan: Visit Diagnoses:  1. Chronic pain of right knee     Plan: Lumbar radiculopathy right sided.  Will provide a prednisone  burst she will call or return if she fails to improve in the next 4 weeks  Follow-Up Instructions: No follow-ups on file.   Right Knee Exam   Muscle Strength  The patient has normal right knee strength.  Tenderness  The patient is experiencing no tenderness.   Range of Motion  The patient has normal right knee ROM.  Tests  Varus: negative Valgus: negative   Back Exam   Tenderness  The patient is experiencing no tenderness.   Range of Motion  The patient has normal back ROM.  Muscle Strength  The patient has normal back strength.  Tests  Straight leg raise right: positive Straight leg raise left: negative      Patient is alert, oriented, no adenopathy, well-dressed,  normal affect, normal respiratory effort.   Imaging: No results found. No images are attached to the encounter.  Labs: Lab Results  Component Value Date   HGBA1C 5.3 06/03/2022   HGBA1C 5.2 05/29/2021   HGBA1C 5.3 04/15/2020     Lab Results  Component Value Date   ALBUMIN 4.5 06/03/2022   ALBUMIN 4.5 05/29/2021   ALBUMIN 4.8 04/15/2020    No results found for: "MG" Lab Results  Component Value Date   VD25OH 51.00 04/15/2020   VD25OH 42.14 05/25/2016   VD25OH 36 06/01/2012    No results found for: "PREALBUMIN"    Latest Ref Rng & Units 06/03/2022    8:14 AM 05/29/2021    8:14 AM 04/15/2020    9:55 AM  CBC EXTENDED  WBC 4.0 - 10.5 K/uL 8.0  6.7  7.2   RBC 3.87 - 5.11 Mil/uL 4.58  4.69  4.61   Hemoglobin 12.0 - 15.0 g/dL 19.1  47.8  29.5   HCT 36.0 - 46.0 % 41.4  42.6  40.3   Platelets 150.0 - 400.0 K/uL 289.0  287.0  280.0      There is no height or weight on file to calculate BMI.  Orders:  Orders Placed This Encounter  Procedures   XR Lumbar Spine 2-3 Views   No orders of the defined types were placed in this encounter.    Procedures: No procedures performed  Clinical Data: No additional findings.  ROS:  All other systems negative, except as noted in the HPI. Review of Systems  Objective: Vital Signs: There were no vitals taken for this visit.  Specialty Comments:  No specialty comments available.  PMFS History: Patient Active Problem List   Diagnosis Date Noted   Dog scratch 08/25/2021   Cellulitis of right upper extremity 08/25/2021   Complete tear of left rotator cuff 01/16/2021   Pain in left shoulder 01/01/2021   Osteopenia 04/23/2020   Migraine 02/27/2019   High blood pressure 10/15/2016   Malignant neoplasm of female breast (HCC) 10/26/2008   Anxiety state 10/26/2008   HYPERLIPIDEMIA 07/17/2008   GERD 07/01/2007   Disorder of bone and cartilage 07/01/2007   INSOMNIA-SLEEP DISORDER-UNSPEC 07/01/2007   Past Medical History:   Diagnosis Date   Allergy    Anemia    Anxiety    Breast cancer (HCC) 2010   S/P Chemo/RadTx- Dr. Bartley Borrow   Depression    GERD (gastroesophageal reflux disease)    past hx- not current   Hyperlipidemia    Hypertension    Osteopenia    Osteoporosis    improved- no meds-    Personal history of chemotherapy    Personal history of radiation therapy     Family History  Problem Relation Age of Onset   Cancer Mother        Throat cancer   Esophageal cancer Mother    Cancer Father        Stomach cancer   Stomach cancer Father    Early death Maternal Grandmother    Pneumonia Maternal Grandmother    Heart attack Maternal Grandfather    Heart attack Paternal Grandfather    Colon cancer Neg Hx    Colon polyps Neg Hx    Rectal cancer Neg Hx     Past Surgical History:  Procedure Laterality Date   ABDOMINAL HYSTERECTOMY     Total hysterectomy with BSO   BREAST BIOPSY Left 2019   benign   BREAST LUMPECTOMY Left 10/29/2008   Negative markers- 1 positive node   COLONOSCOPY     POLYPECTOMY     SEPTOPLASTY     SHOULDER SURGERY     TONSILLECTOMY AND ADENOIDECTOMY     Social History   Occupational History   Not on file  Tobacco Use   Smoking status: Former   Smokeless tobacco: Never  Vaping Use   Vaping status: Never Used  Substance and Sexual Activity   Alcohol use: Yes    Alcohol/week: 5.0 standard drinks of alcohol    Types: 5 Cans of beer per week   Drug use: No   Sexual activity: Yes    Birth control/protection: Surgical

## 2023-05-19 ENCOUNTER — Ambulatory Visit
Admission: RE | Admit: 2023-05-19 | Discharge: 2023-05-19 | Disposition: A | Discharge status: Home / Self Care | Source: Ambulatory Visit | Attending: Family Medicine | Admitting: Family Medicine

## 2023-05-19 DIAGNOSIS — Z1231 Encounter for screening mammogram for malignant neoplasm of breast: Secondary | ICD-10-CM

## 2023-06-12 NOTE — Progress Notes (Signed)
 [facility] [address], [address] [place], [place] [address] [phone]  Date:  06/16/2023   Name:  Leslie Crawford   DOB:  10/03/1950   MRN:  [id number]  PCP:  Kaylee Partridge, MD    Chief Complaint: Annual Exam   History of Present Illness:  Leslie Crawford is a 73 y.o. very pleasant female patient who presents with the following:  Pt seen today for a CPE Last seen by myself last month with foot pain She saw ortho and is being treat for sciatica   She has history of hypertension, hyperlipidemia, breast cancer She is also been seen by neurology for ocular migraine Her HA are "stress related and this has been a very stressful year"  She might like to try some medication for stress and insomnia; she has been under more stress since her son's fiance had a sudden health crisis in December.  It sounds like she had a seizure and then dramatic cognitive dysfunction-she has been making great progress thankfully We will try some trazodone at bedtime for adjustment insomnia   She had breast cancer over a decade ago, diagnosed in 2010.  She had surgery, chemo and radiation. She does continue regular mammogram   Mammo UTD Colon UTD Dexa- she is on Boniva .  Scan one year ago  Singrix, tetanus and pneumonia UTD   Due for labs    Patient Active Problem List   Diagnosis Date Noted   Dog scratch 08/25/2021   Cellulitis of right upper extremity 08/25/2021   Complete tear of left rotator cuff 01/16/2021   Pain in left shoulder 01/01/2021   Osteopenia 04/23/2020   Migraine 02/27/2019   High blood pressure 10/15/2016   Malignant neoplasm of female breast (HCC) 10/26/2008   Anxiety state 10/26/2008   HYPERLIPIDEMIA 07/17/2008   GERD 07/01/2007   Disorder of bone and cartilage 07/01/2007   INSOMNIA-SLEEP DISORDER-UNSPEC 07/01/2007    Past Medical History:  Diagnosis Date   Allergy    Anemia    Anxiety     Breast cancer (HCC) 2010   S/P Chemo/RadTx- Dr. Bartley Borrow   Depression    GERD (gastroesophageal reflux disease)    past hx- not current   Hyperlipidemia    Hypertension    Osteopenia    Osteoporosis    improved- no meds-    Personal history of chemotherapy    Personal history of radiation therapy     Past Surgical History:  Procedure Laterality Date   ABDOMINAL HYSTERECTOMY     Total hysterectomy with BSO   BREAST BIOPSY Left 2019   benign   BREAST LUMPECTOMY Left 10/29/2008   Negative markers- 1 positive node   COLONOSCOPY     POLYPECTOMY     SEPTOPLASTY     SHOULDER SURGERY     TONSILLECTOMY AND ADENOIDECTOMY      Social History   Tobacco Use   Smoking status: Former   Smokeless tobacco: Never  Vaping Use   Vaping status: Never Used  Substance Use Topics   Alcohol use: Yes    Alcohol/week: 5.0 standard drinks of alcohol    Types: 5 Cans of beer per week   Drug use: No    Family History  Problem Relation Age of Onset   Cancer Mother        Throat cancer   Esophageal cancer Mother    Cancer Father  Stomach cancer   Stomach cancer Father    Early death Maternal Grandmother    Pneumonia Maternal Grandmother    Heart attack Maternal Grandfather    Heart attack Paternal Grandfather    Colon cancer Neg Hx    Colon polyps Neg Hx    Rectal cancer Neg Hx     Allergies  Allergen Reactions   Topamax  [Topiramate ] Other (See Comments)    Decreased cognitive function    Medication list has been reviewed and updated.  Current Outpatient Medications on File Prior to Visit  Medication Sig Dispense Refill   amLODipine  (NORVASC ) 5 MG tablet Take 1 tablet (5 mg total) by mouth daily. 90 tablet 3   [facility]S MAGNESIUM CITRATE PO Take 500 mg by mouth.     OVER THE COUNTER MEDICATION Take 1 tablet by mouth every evening.     predniSONE  (DELTASONE ) 50 MG tablet Take one tablet by mouth once daily for 5 days. (Patient not taking: Reported on 06/16/2023) 5 tablet 0    No current facility-administered medications on file prior to visit.    Review of Systems:  As per HPI- otherwise negative. Aaron Aas  Physical Examination: Vitals:   06/16/23 0927  BP: 132/76  Pulse: 76   Vitals:   06/16/23 0927  Weight: 155 lb 9.6 oz (70.6 kg)  Height: 5\' 4"  (1.626 m)   Body mass index is 26.71 kg/m. Ideal Body Weight: Weight in (lb) to have BMI = 25: 145.3  GEN: no acute distress.  Normal weight for age, looks well HEENT: Atraumatic, Normocephalic.  Bilateral TM wnl, oropharynx normal.  PEERL,EOMI.   Ears and Nose: No external deformity. CV: RRR, No M/G/R. No JVD. No thrill. No extra heart sounds. PULM: CTA B, no wheezes, crackles, rhonchi. No retractions. No resp. distress. No accessory muscle use. ABD: S, NT, ND, +BS. No rebound. No HSM. EXTR: No c/c/e PSYCH: Normally interactive. Conversant.    Assessment and Plan: Physical exam  Benign essential hypertension - Plan: CBC, Comprehensive metabolic panel with GFR, losartan  (COZAAR ) 50 MG tablet  Primary osteoarthritis of both hands  Dyslipidemia - Plan: Lipid panel, rosuvastatin  (CRESTOR ) 20 MG tablet  Thyroid  disorder screening - Plan: TSH  Screening for deficiency anemia - Plan: CBC  Screening for diabetes mellitus - Plan: Hemoglobin A1c  Osteopenia, unspecified location - Plan: ibandronate  (BONIVA ) 150 MG tablet  Adjustment insomnia - Plan: traZODone (DESYREL) 50 MG tablet  Physical exam today.  Encouraged healthy diet and exercise routine.  Suggested getting a dose of RSV either now or at age 68, she stays up-to-date on her flu and COVID vaccines Blood pressure is well-controlled on current medication We are treating her for dyslipidemia and osteopenia as above Will have her try trazodone as needed for insomnia; she will let me know how this works for Will plan further follow- up pending labs.   Signed Gates Kasal, MD  Received labs as below, message to patient  Results for  orders placed or performed in visit on 06/16/23  CBC   Collection Time: 06/16/23  9:53 AM  Result Value Ref Range   WBC 9.1 4.0 - 10.5 K/uL   RBC 4.67 3.87 - 5.11 Mil/uL   Platelets 285.0 150.0 - 400.0 K/uL   Hemoglobin 14.4 12.0 - 15.0 g/dL   HCT 16.1 09.6 - 04.5 %   MCV 90.1 78.0 - 100.0 fl   MCHC 34.3 30.0 - 36.0 g/dL   RDW 40.9 81.1 - 91.4 %  Comprehensive metabolic panel with  GFR   Collection Time: 06/16/23  9:53 AM  Result Value Ref Range   Sodium 138 135 - 145 mEq/L   Potassium 4.1 3.5 - 5.1 mEq/L   Chloride 102 96 - 112 mEq/L   CO2 26 19 - 32 mEq/L   Glucose, Bld 93 70 - 99 mg/dL   BUN 12 6 - 23 mg/dL   Creatinine, Ser 0.86 0.40 - 1.20 mg/dL   Total Bilirubin 1.0 0.2 - 1.2 mg/dL   Alkaline Phosphatase 78 39 - 117 U/L   AST 28 0 - 37 U/L   ALT 28 0 - 35 U/L   Total Protein 7.4 6.0 - 8.3 g/dL   Albumin 4.8 3.5 - 5.2 g/dL   GFR 57.84 >69.62 mL/min   Calcium  9.8 8.4 - 10.5 mg/dL  Hemoglobin X5M   Collection Time: 06/16/23  9:53 AM  Result Value Ref Range   Hgb A1c MFr Bld 5.4 4.6 - 6.5 %  Lipid panel   Collection Time: 06/16/23  9:53 AM  Result Value Ref Range   Cholesterol 217 (H) 0 - 200 mg/dL   Triglycerides 841.3 (H) 0.0 - 149.0 mg/dL   HDL 24.40 >10.27 mg/dL   VLDL 25.3 0.0 - 66.4 mg/dL   LDL Cholesterol 403 (H) 0 - 99 mg/dL   Total CHOL/HDL Ratio 3    NonHDL 140.73   TSH   Collection Time: 06/16/23  9:53 AM  Result Value Ref Range   TSH 2.81 0.35 - 5.50 uIU/mL   Lab results

## 2023-06-12 NOTE — Patient Instructions (Addendum)
 It was good to see you again today I will be in touch with your labs  Recommend keeping your covid booster up to date  You might want to get a dose of RSV now- or can wait until 74  Let's try trazodone for stress related insomnia Let me know what your eye doctor says about the cholesterol med- we can change to something else if needed

## 2023-06-16 ENCOUNTER — Ambulatory Visit (INDEPENDENT_AMBULATORY_CARE_PROVIDER_SITE_OTHER): Admitting: Family Medicine

## 2023-06-16 ENCOUNTER — Encounter: Payer: Self-pay | Admitting: Family Medicine

## 2023-06-16 VITALS — BP 132/76 | HR 76 | Ht 64.0 in | Wt 155.6 lb

## 2023-06-16 DIAGNOSIS — M19041 Primary osteoarthritis, right hand: Secondary | ICD-10-CM

## 2023-06-16 DIAGNOSIS — M19042 Primary osteoarthritis, left hand: Secondary | ICD-10-CM

## 2023-06-16 DIAGNOSIS — M8580 Other specified disorders of bone density and structure, unspecified site: Secondary | ICD-10-CM

## 2023-06-16 DIAGNOSIS — Z131 Encounter for screening for diabetes mellitus: Secondary | ICD-10-CM

## 2023-06-16 DIAGNOSIS — Z0000 Encounter for general adult medical examination without abnormal findings: Secondary | ICD-10-CM | POA: Diagnosis not present

## 2023-06-16 DIAGNOSIS — E785 Hyperlipidemia, unspecified: Secondary | ICD-10-CM

## 2023-06-16 DIAGNOSIS — I10 Essential (primary) hypertension: Secondary | ICD-10-CM

## 2023-06-16 DIAGNOSIS — Z130 Encounter for screening for diseases of the blood and blood-forming organs and certain disorders involving the immune mechanism: Secondary | ICD-10-CM

## 2023-06-16 DIAGNOSIS — Z1329 Encounter for screening for other suspected endocrine disorder: Secondary | ICD-10-CM | POA: Diagnosis not present

## 2023-06-16 DIAGNOSIS — F5102 Adjustment insomnia: Secondary | ICD-10-CM

## 2023-06-16 LAB — COMPREHENSIVE METABOLIC PANEL WITH GFR
ALT: 28 U/L (ref 0–35)
AST: 28 U/L (ref 0–37)
Albumin: 4.8 g/dL (ref 3.5–5.2)
Alkaline Phosphatase: 78 U/L (ref 39–117)
BUN: 12 mg/dL (ref 6–23)
CO2: 26 meq/L (ref 19–32)
Calcium: 9.8 mg/dL (ref 8.4–10.5)
Chloride: 102 meq/L (ref 96–112)
Creatinine, Ser: 0.82 mg/dL (ref 0.40–1.20)
GFR: 70.75 mL/min (ref >60.00)
Glucose, Bld: 93 mg/dL (ref 70–99)
Potassium: 4.1 meq/L (ref 3.5–5.1)
Sodium: 138 meq/L (ref 135–145)
Total Bilirubin: 1 mg/dL (ref 0.2–1.2)
Total Protein: 7.4 g/dL (ref 6.0–8.3)

## 2023-06-16 LAB — TSH: TSH: 2.81 u[IU]/mL (ref 0.35–5.50)

## 2023-06-16 LAB — CBC
HCT: 42.1 % (ref 36.0–46.0)
Hemoglobin: 14.4 g/dL (ref 12.0–15.0)
MCHC: 34.3 g/dL (ref 30.0–36.0)
MCV: 90.1 fl (ref 78.0–100.0)
Platelets: 285 10*3/uL (ref 150.0–400.0)
RBC: 4.67 Mil/uL (ref 3.87–5.11)
RDW: 13 % (ref 11.5–15.5)
WBC: 9.1 10*3/uL (ref 4.0–10.5)

## 2023-06-16 LAB — LIPID PANEL
Cholesterol: 217 mg/dL — ABNORMAL HIGH (ref 0–200)
HDL: 76.2 mg/dL (ref >39.00)
LDL Cholesterol: 105 mg/dL — ABNORMAL HIGH (ref 0–99)
NonHDL: 140.73
Total CHOL/HDL Ratio: 3
Triglycerides: 177 mg/dL — ABNORMAL HIGH (ref 0.0–149.0)
VLDL: 35.4 mg/dL (ref 0.0–40.0)

## 2023-06-16 LAB — HEMOGLOBIN A1C: Hgb A1c MFr Bld: 5.4 % (ref 4.6–6.5)

## 2023-06-16 MED ORDER — ROSUVASTATIN CALCIUM 20 MG PO TABS: 20.0000 mg | ORAL_TABLET | Freq: Every day | ORAL | 3 refills | Status: DC

## 2023-06-16 MED ORDER — IBANDRONATE SODIUM 150 MG PO TABS: 150.0000 mg | ORAL_TABLET | ORAL | 3 refills | Status: AC

## 2023-06-16 MED ORDER — LOSARTAN POTASSIUM 50 MG PO TABS
50.0000 mg | ORAL_TABLET | Freq: Every day | ORAL | 3 refills | Status: AC
Start: 2023-06-16 — End: ?

## 2023-06-16 MED ORDER — TRAZODONE HCL 50 MG PO TABS: 25.0000 mg | ORAL_TABLET | Freq: Every evening | ORAL | 3 refills | Status: DC | PRN

## 2023-06-17 ENCOUNTER — Encounter: Payer: Self-pay | Admitting: Cardiology

## 2023-07-02 ENCOUNTER — Other Ambulatory Visit: Payer: Self-pay | Admitting: Family Medicine

## 2023-07-02 ENCOUNTER — Other Ambulatory Visit: Payer: Self-pay | Admitting: Family

## 2023-07-02 ENCOUNTER — Other Ambulatory Visit: Payer: Self-pay | Admitting: Orthopedic Surgery

## 2023-07-02 DIAGNOSIS — I10 Essential (primary) hypertension: Secondary | ICD-10-CM

## 2023-07-02 MED ORDER — PREDNISONE 10 MG PO TABS: 10.0000 mg | ORAL_TABLET | Freq: Every day | ORAL | 0 refills | Status: DC

## 2023-07-08 ENCOUNTER — Encounter: Payer: Self-pay | Admitting: Family Medicine

## 2023-08-04 DIAGNOSIS — H524 Presbyopia: Secondary | ICD-10-CM | POA: Diagnosis not present

## 2023-08-18 ENCOUNTER — Encounter: Payer: Self-pay | Admitting: Family Medicine

## 2023-08-18 DIAGNOSIS — F5102 Adjustment insomnia: Secondary | ICD-10-CM

## 2023-08-18 MED ORDER — TRAZODONE HCL 50 MG PO TABS: 75.0000 mg | ORAL_TABLET | Freq: Every evening | ORAL | 3 refills | Status: AC | PRN

## 2023-09-23 DIAGNOSIS — D1801 Hemangioma of skin and subcutaneous tissue: Secondary | ICD-10-CM | POA: Diagnosis not present

## 2023-09-23 DIAGNOSIS — L814 Other melanin hyperpigmentation: Secondary | ICD-10-CM | POA: Diagnosis not present

## 2023-09-23 DIAGNOSIS — L821 Other seborrheic keratosis: Secondary | ICD-10-CM | POA: Diagnosis not present

## 2023-09-23 DIAGNOSIS — D692 Other nonthrombocytopenic purpura: Secondary | ICD-10-CM | POA: Diagnosis not present

## 2023-10-05 ENCOUNTER — Encounter: Payer: Self-pay | Admitting: Family Medicine

## 2023-10-08 MED ORDER — COVID-19 MRNA VAC-TRIS(PFIZER) 30 MCG/0.3ML IM SUSY: 0.3000 mL | PREFILLED_SYRINGE | Freq: Once | INTRAMUSCULAR | 0 refills | Status: AC

## 2023-11-29 ENCOUNTER — Encounter: Payer: Self-pay | Admitting: Radiology

## 2023-12-27 ENCOUNTER — Ambulatory Visit: Payer: Self-pay

## 2023-12-27 NOTE — Telephone Encounter (Signed)
 This RN made first attempt to contact pt with no answer. A voicemail was left with call back number provided.   Copied from CRM #[id number]. Topic: Clinical - Red Word Triage >> Dec 27, 2023 11:42 AM Thersia BROCKS wrote: [facility] that prompted transfer to Nurse Triage: Patient called in stated she has been experiencing  insomnia and anxiety , would like to speak with her pcp about this

## 2023-12-27 NOTE — Telephone Encounter (Signed)
 Agent took call back from patient- she had disconnected at transfer. Attempted to call patient on cell number x2- constant ring- no answer or message, Called home number- number no longer active.

## 2023-12-27 NOTE — Telephone Encounter (Signed)
 3rd attempt, no answer. LVM to call clinic back, number provided.

## 2023-12-27 NOTE — Telephone Encounter (Signed)
 FYI Only or Action Required?: FYI only for provider: appointment scheduled on 12/29/23.  Patient was last seen in primary care on 06/16/2023 by Copland, Harlene BROCKS, MD.  Called Nurse Triage reporting Anxiety.  Symptoms began several weeks ago.  Interventions attempted: Prescription medications: trazodone .  Symptoms are: unchanged.  Triage Disposition: See PCP When Office is Open (Within 3 Days)  Patient/caregiver understands and will follow disposition?: Yes   Copied from CRM #[id number]. Topic: Clinical - Red Word Triage >> Dec 27, 2023 11:42 AM Thersia BROCKS wrote: [facility] that prompted transfer to Nurse Triage: Patient called in stated she has been experiencing  insomnia and anxiety , would like to speak with her pcp about this     Reason for Disposition  MODERATE anxiety (e.g., persistent or frequent anxiety symptoms; interferes with sleep, school, or work)  Answer Assessment - Initial Assessment Questions Patient says she's having insomnia, waking up at sometimes 1 am and 3 am when going to bed around 10 pm. She says her mind is racing with thoughts of worry about her son and his fiance all the time. 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 She says she's worried that it will happen again. She says she's just not sleeping and would like to see Dr. Watt to see if there is something else she can take. She takes the trazodone  1.5-2 pills sometimes at night, but it's not helping her stay sleep all night. Appointment already scheduled for 12/29/23, advised to keep that appointment. She verbalized understanding.   1. CONCERN: Did anything happen that prompted you to call today?      Unable to sleep  2. ANXIETY SYMPTOMS: Can you describe how you (your loved one; patient) have been feeling? (e.g., tense, restless, panicky, anxious, keyed up, overwhelmed, sense of impending doom).      Just worrying about my son and his  fiance, unable to sleep       3 SEVERITY: How would you rate the level of anxiety? (e.g., 0 - 10; or mild, moderate, severe).     No anxiety  4. FUNCTIONAL IMPAIRMENT: How have these feelings affected your ability to do daily activities? Have you had more difficulty than usual doing your normal daily activities? (e.g., getting better, same, worse; self-care, school, work, interactions)     No affect on day to day  5. HISTORY: Have you felt this way before? Have you ever been diagnosed with an anxiety problem in the past? (e.g., generalized anxiety disorder, panic attacks, PTSD). If Yes, ask: How was this problem treated? (e.g., medicines, counseling, etc.)     Prescribed trazodone  to help me sleep when the cardiac arrest of my son's fiance happened    6. RISK OF HARM - SUICIDAL IDEATION: Do you ever have thoughts of hurting or killing yourself? If Yes, ask:  Do you have these feelings now? Do you have a plan on how you would do this?     No      7 OTHER SYMPTOMS: Do you have any other symptoms? (e.g., feeling depressed, trouble concentrating, trouble sleeping, trouble breathing, palpitations or fast heartbeat, chest pain, sweating, nausea, or diarrhea)       No  Protocols used: Anxiety and Panic Attack-A-AH

## 2023-12-28 NOTE — Patient Instructions (Incomplete)
 It was good to see you again today!  Please let me know how you do with the changes we are making!   Add fluoxetine 20 mg daily for general anxiety Continue trazodone  as needed- 50 mg generally but can do 75 if needed Can use alprazolam on occasion for more severe insomnia symptoms- don't use when driving and can be habit forming.  No more than 50 mg of trazodone  if you use the alprazolam

## 2023-12-28 NOTE — Progress Notes (Unsigned)
 [facility] [address] [place], [place] [address] [phone]  Date:  12/29/2023   Name:  Leslie Crawford   DOB:  Dec 22, 1950   MRN:  [id number]  PCP:  Watt Harlene BROCKS, MD    Chief Complaint: No chief complaint on file.   History of Present Illness:  Leslie Crawford is a 73 y.o. very pleasant female patient who presents with the following:  Patient seen today to discuss anxiety and difficulty sleeping.  I saw her most recently in May for routine physical She has history of hypertension, hyperlipidemia, breast cancer diagnosed in 2010, ocular migraine which has been evaluated by neurology  Her flu shot is up-to-date, she has completed Shingrix  and pneumonia vaccination.  Suggest dose of RSV  She currently has trazodone  to take for insomnia  Discussed the use of AI scribe software for clinical note transcription with the patient, who gave verbal consent to proceed.  History of Present Illness      Patient Active Problem List   Diagnosis Date Noted   Dog scratch 08/25/2021   Cellulitis of right upper extremity 08/25/2021   Complete tear of left rotator cuff 01/16/2021   Pain in left shoulder 01/01/2021   Osteopenia 04/23/2020   Migraine 02/27/2019   High blood pressure 10/15/2016   Malignant neoplasm of female breast (HCC) 10/26/2008   Anxiety state 10/26/2008   HYPERLIPIDEMIA 07/17/2008   GERD 07/01/2007   Disorder of bone and cartilage 07/01/2007   INSOMNIA-SLEEP DISORDER-UNSPEC 07/01/2007    Past Medical History:  Diagnosis Date   Allergy    Anemia    Anxiety    Breast cancer (HCC) 2010   S/P Chemo/RadTx- Dr. Maude Rao   Depression    GERD (gastroesophageal reflux disease)    past hx- not current   Hyperlipidemia    Hypertension    Osteopenia    Osteoporosis    improved- no meds-    Personal history of chemotherapy    Personal history of radiation therapy     Past  Surgical History:  Procedure Laterality Date   ABDOMINAL HYSTERECTOMY     Total hysterectomy with BSO   BREAST BIOPSY Left 2019   benign   BREAST LUMPECTOMY Left 10/29/2008   Negative markers- 1 positive node   COLONOSCOPY     POLYPECTOMY     SEPTOPLASTY     SHOULDER SURGERY     TONSILLECTOMY AND ADENOIDECTOMY      Social History   Tobacco Use   Smoking status: Former   Smokeless tobacco: Never  Vaping Use   Vaping status: Never Used  Substance Use Topics   Alcohol use: Yes    Alcohol/week: 5.0 standard drinks of alcohol    Types: 5 Cans of beer per week   Drug use: No    Family History  Problem Relation Age of Onset   Cancer Mother        Throat cancer   Esophageal cancer Mother    Cancer Father        Stomach cancer   Stomach cancer Father    Early death Maternal Grandmother    Pneumonia Maternal Grandmother    Heart attack Maternal Grandfather    Heart attack Paternal Grandfather    Colon cancer Neg Hx    Colon polyps Neg Hx    Rectal cancer Neg Hx     Allergies  Allergen Reactions   Topamax  [Topiramate ] Other (See  Comments)    Decreased cognitive function    Medication list has been reviewed and updated.  Current Outpatient Medications on File Prior to Visit  Medication Sig Dispense Refill   amLODipine  (NORVASC ) 5 MG tablet TAKE 1 TABLET (5 MG TOTAL) BY MOUTH DAILY. 90 tablet 3   CVS MAGNESIUM CITRATE PO Take 500 mg by mouth.     ibandronate  (BONIVA ) 150 MG tablet Take 1 tablet (150 mg total) by mouth every 30 (thirty) days. Take in the morning with a full glass of water, on an empty stomach, and do not take anything else by mouth or lie down for the next 30 min. 3 tablet 3   losartan  (COZAAR ) 50 MG tablet Take 1 tablet (50 mg total) by mouth daily. 90 tablet 3   OVER THE COUNTER MEDICATION Take 1 tablet by mouth every evening.     predniSONE  (DELTASONE ) 10 MG tablet Take 1 tablet (10 mg total) by mouth daily with breakfast. 30 tablet 0   predniSONE   (DELTASONE ) 50 MG tablet Take one tablet by mouth once daily for 5 days. (Patient not taking: Reported on 06/16/2023) 5 tablet 0   rosuvastatin  (CRESTOR ) 20 MG tablet Take 1 tablet (20 mg total) by mouth daily. 90 tablet 3   traZODone  (DESYREL ) 50 MG tablet Take 1.5 tablets (75 mg total) by mouth at bedtime as needed for sleep. 135 tablet 3   No current facility-administered medications on file prior to visit.    Review of Systems:  As per HPI- otherwise negative.   Physical Examination: There were no vitals filed for this visit. There were no vitals filed for this visit. There is no height or weight on file to calculate BMI. Ideal Body Weight:    GEN: no acute distress. HEENT: Atraumatic, Normocephalic.  Ears and Nose: No external deformity. CV: RRR, No M/G/R. No JVD. No thrill. No extra heart sounds. PULM: CTA B, no wheezes, crackles, rhonchi. No retractions. No resp. distress. No accessory muscle use. ABD: S, NT, ND, +BS. No rebound. No HSM. EXTR: No c/c/e PSYCH: Normally interactive. Conversant.    Assessment and Plan: No diagnosis found.  Assessment & Plan   Signed Harlene Schroeder, MD

## 2023-12-28 NOTE — Telephone Encounter (Signed)
 Appt scheduled

## 2023-12-29 ENCOUNTER — Ambulatory Visit: Admitting: Family Medicine

## 2023-12-29 VITALS — BP 112/68 | HR 95 | Temp 98.1°F | Ht 64.0 in | Wt 156.6 lb

## 2023-12-29 DIAGNOSIS — F5102 Adjustment insomnia: Secondary | ICD-10-CM

## 2023-12-29 DIAGNOSIS — F4322 Adjustment disorder with anxiety: Secondary | ICD-10-CM

## 2023-12-29 MED ORDER — ALPRAZOLAM 0.25 MG PO TABS: 0.2500 mg | ORAL_TABLET | Freq: Two times a day (BID) | ORAL | 0 refills | Status: AC | PRN

## 2023-12-29 MED ORDER — FLUOXETINE HCL 20 MG PO CAPS: 20.0000 mg | ORAL_CAPSULE | Freq: Every day | ORAL | 3 refills | Status: AC

## 2024-01-29 NOTE — Progress Notes (Unsigned)
 [facility] [address], [address] [place], [place] [address] [phone]  Date:  02/02/2024   Name:  Leslie Crawford   DOB:  02/12/1950   MRN:  [id number]  PCP:  Watt Harlene BROCKS, MD    Chief Complaint: No chief complaint on file.   History of Present Illness:  Leslie Crawford is a 74 y.o. very pleasant female patient who presents with the following:  Patient seen today for a virtual visit to discuss recent stressors I saw her most recently about 1 month ago She has history of hypertension, hyperlipidemia, breast cancer diagnosed in 2010, ocular migraine which has been evaluated by neurology  Her son is engaged to a woman who has had some major health problems including 2 cardiac arrests and a stroke; this has caused her to worry quite a bit and have difficulty sleeping  At our visit last month we continued trazodone  and added fluoxetine   Discussed the use of AI scribe software for clinical note transcription with the patient, who gave verbal consent to proceed.  History of Present Illness     Patient Active Problem List   Diagnosis Date Noted   Dog scratch 08/25/2021   Cellulitis of right upper extremity 08/25/2021   Complete tear of left rotator cuff 01/16/2021   Pain in left shoulder 01/01/2021   Osteopenia 04/23/2020   Migraine 02/27/2019   High blood pressure 10/15/2016   Malignant neoplasm of female breast (HCC) 10/26/2008   Anxiety state 10/26/2008   HYPERLIPIDEMIA 07/17/2008   GERD 07/01/2007   Disorder of bone and cartilage 07/01/2007   INSOMNIA-SLEEP DISORDER-UNSPEC 07/01/2007    Past Medical History:  Diagnosis Date   Allergy    Anemia    Anxiety    Breast cancer (HCC) 2010   S/P Chemo/RadTx- Dr. Maude Rao   Depression    GERD (gastroesophageal reflux disease)    past hx- not current   Hyperlipidemia    Hypertension    Osteopenia    Osteoporosis    improved- no meds-     Personal history of chemotherapy    Personal history of radiation therapy     Past Surgical History:  Procedure Laterality Date   ABDOMINAL HYSTERECTOMY     Total hysterectomy with BSO   BREAST BIOPSY Left 2019   benign   BREAST LUMPECTOMY Left 10/29/2008   Negative markers- 1 positive node   COLONOSCOPY     POLYPECTOMY     SEPTOPLASTY     SHOULDER SURGERY     TONSILLECTOMY AND ADENOIDECTOMY      Social History[1]  Family History  Problem Relation Age of Onset   Cancer Mother        Throat cancer   Esophageal cancer Mother    Cancer Father        Stomach cancer   Stomach cancer Father    Early death Maternal Grandmother    Pneumonia Maternal Grandmother    Heart attack Maternal Grandfather    Heart attack Paternal Grandfather    Colon cancer Neg Hx    Colon polyps Neg Hx    Rectal cancer Neg Hx     Allergies[2]  Medication list has been reviewed and updated.  Medications Ordered Prior to Encounter[3]  Review of Systems:  As per HPI- otherwise negative.   Physical Examination: There were no vitals filed for this visit. There were no vitals filed for this visit. There is no  height or weight on file to calculate BMI. Ideal Body Weight:    ***  Assessment and Plan: No diagnosis found.  Assessment & Plan   Signed Harlene Schroeder, MD    [1]  Social History Tobacco Use   Smoking status: Former   Smokeless tobacco: Never  Vaping Use   Vaping status: Never Used  Substance Use Topics   Alcohol use: Yes    Alcohol/week: 5.0 standard drinks of alcohol    Types: 5 Cans of beer per week   Drug use: No  [2]  Allergies Allergen Reactions   Topamax  [Topiramate ] Other (See Comments)    Decreased cognitive function  [3]  Current Outpatient Medications on File Prior to Visit  Medication Sig Dispense Refill   ALPRAZolam  (XANAX ) 0.25 MG tablet Take 1 tablet (0.25 mg total) by mouth 2 (two) times daily as needed for anxiety. 20 tablet 0   amLODipine   (NORVASC ) 5 MG tablet TAKE 1 TABLET (5 MG TOTAL) BY MOUTH DAILY. 90 tablet 3   CVS MAGNESIUM CITRATE PO Take 500 mg by mouth.     FLUoxetine  (PROZAC ) 20 MG capsule Take 1 capsule (20 mg total) by mouth daily. 90 capsule 3   ibandronate  (BONIVA ) 150 MG tablet Take 1 tablet (150 mg total) by mouth every 30 (thirty) days. Take in the morning with a full glass of water, on an empty stomach, and do not take anything else by mouth or lie down for the next 30 min. 3 tablet 3   losartan  (COZAAR ) 50 MG tablet Take 1 tablet (50 mg total) by mouth daily. 90 tablet 3   OVER THE COUNTER MEDICATION Take 1 tablet by mouth every evening.     predniSONE  (DELTASONE ) 10 MG tablet Take 1 tablet (10 mg total) by mouth daily with breakfast. 30 tablet 0   rosuvastatin  (CRESTOR ) 20 MG tablet Take 1 tablet (20 mg total) by mouth daily. 90 tablet 3   traZODone  (DESYREL ) 50 MG tablet Take 1.5 tablets (75 mg total) by mouth at bedtime as needed for sleep. 135 tablet 3   No current facility-administered medications on file prior to visit.   "

## 2024-02-02 ENCOUNTER — Encounter: Payer: Self-pay | Admitting: Family Medicine

## 2024-02-02 ENCOUNTER — Ambulatory Visit (INDEPENDENT_AMBULATORY_CARE_PROVIDER_SITE_OTHER): Payer: Self-pay | Admitting: Family Medicine

## 2024-02-02 VITALS — BP 120/78 | HR 83 | Ht 64.0 in | Wt 152.0 lb

## 2024-02-02 DIAGNOSIS — I10 Essential (primary) hypertension: Secondary | ICD-10-CM

## 2024-02-02 DIAGNOSIS — F5102 Adjustment insomnia: Secondary | ICD-10-CM

## 2024-02-02 DIAGNOSIS — Z131 Encounter for screening for diabetes mellitus: Secondary | ICD-10-CM

## 2024-02-02 DIAGNOSIS — F4322 Adjustment disorder with anxiety: Secondary | ICD-10-CM

## 2024-02-02 DIAGNOSIS — G453 Amaurosis fugax: Secondary | ICD-10-CM | POA: Diagnosis not present

## 2024-02-02 LAB — BASIC METABOLIC PANEL WITH GFR
BUN: 18 mg/dL (ref 6–23)
CO2: 27 meq/L (ref 19–32)
Calcium: 9.7 mg/dL (ref 8.4–10.5)
Chloride: 101 meq/L (ref 96–112)
Creatinine, Ser: 0.89 mg/dL (ref 0.40–1.20)
GFR: 63.84 mL/min
Glucose, Bld: 84 mg/dL (ref 70–99)
Potassium: 4.3 meq/L (ref 3.5–5.1)
Sodium: 136 meq/L (ref 135–145)

## 2024-02-02 LAB — CBC
HCT: 42.6 % (ref 36.0–46.0)
Hemoglobin: 14.7 g/dL (ref 12.0–15.0)
MCHC: 34.5 g/dL (ref 30.0–36.0)
MCV: 90.5 fl (ref 78.0–100.0)
Platelets: 259 K/uL (ref 150.0–400.0)
RBC: 4.72 Mil/uL (ref 3.87–5.11)
RDW: 13.1 % (ref 11.5–15.5)
WBC: 6.4 K/uL (ref 4.0–10.5)

## 2024-02-02 LAB — LIPID PANEL
Cholesterol: 185 mg/dL (ref 28–200)
HDL: 88.4 mg/dL
LDL Cholesterol: 80 mg/dL (ref 10–99)
NonHDL: 96.67
Total CHOL/HDL Ratio: 2
Triglycerides: 85 mg/dL (ref 10.0–149.0)
VLDL: 17 mg/dL (ref 0.0–40.0)

## 2024-02-02 LAB — C-REACTIVE PROTEIN: CRP: <0.5 mg/dL — ABNORMAL LOW (ref 1.0–20.0)

## 2024-02-02 LAB — SEDIMENTATION RATE: Sed Rate: 4 mm/h (ref 0–30)

## 2024-02-02 LAB — HEMOGLOBIN A1C: Hgb A1c MFr Bld: 5.2 % (ref 4.6–6.5)

## 2024-02-02 NOTE — Patient Instructions (Signed)
 Good to see you today!  I will be in touch with your labs and we will set up an echo to check on your aortic valve and also carotid ultrasound for you  I will look at the recommended dosage of aspirin (or if we need anything else!) and will get back to you asap

## 2024-02-04 ENCOUNTER — Ambulatory Visit (HOSPITAL_BASED_OUTPATIENT_CLINIC_OR_DEPARTMENT_OTHER)
Admission: RE | Admit: 2024-02-04 | Discharge: 2024-02-04 | Disposition: A | Source: Ambulatory Visit | Attending: Family Medicine | Admitting: Family Medicine

## 2024-02-04 DIAGNOSIS — G453 Amaurosis fugax: Secondary | ICD-10-CM | POA: Insufficient documentation

## 2024-02-06 ENCOUNTER — Encounter: Payer: Self-pay | Admitting: Family Medicine

## 2024-02-06 DIAGNOSIS — G453 Amaurosis fugax: Secondary | ICD-10-CM

## 2024-02-07 ENCOUNTER — Encounter: Payer: Self-pay | Admitting: Neurology

## 2024-02-07 ENCOUNTER — Ambulatory Visit (HOSPITAL_COMMUNITY)
Admission: RE | Admit: 2024-02-07 | Discharge: 2024-02-07 | Disposition: A | Discharge status: Home / Self Care | Source: Ambulatory Visit | Attending: Family Medicine | Admitting: Family Medicine

## 2024-02-07 DIAGNOSIS — G453 Amaurosis fugax: Secondary | ICD-10-CM | POA: Diagnosis present

## 2024-02-07 LAB — ECHOCARDIOGRAM COMPLETE
AR max vel: 1.02 cm2
AV Area VTI: 1.09 cm2
AV Area mean vel: 1.06 cm2
AV Mean grad: 10 mmHg
AV Peak grad: 20.3 mmHg
Ao pk vel: 2.25 m/s
Area-P 1/2: 2.64 cm2
S' Lateral: 2.51 cm

## 2024-02-23 NOTE — Progress Notes (Unsigned)
 "  NEUROLOGY CONSULTATION NOTE  Leslie Crawford MRN: [id number] DOB: Jul 30, 1950  Referring provider: Harlene Schroeder, MD Primary care provider: Harlene Schroeder, MD  Reason for consult:  amaurosis fugax  Assessment/Plan:   Left amaurosis fugax Hypertension Hyperlipidemia   For further workup: Check CT/CTA of head to evaluate intracranial vasculature Check 2 week Zio patch monitor to evaluate for a fib. Continue aspirin 81mg  daily for secondary stroke prevention. LDL goal less than 70.  Increase rosuvastatin  to 40mg  daily.  Repeat lipid panel in 3 months (along with hepatic panel). Normotensive blood pressure Hgb A1c goal less than 7 Continue regular walking/exercise Consider Mediterranean diet. Follow up 6 months.   Subjective:     Discussed the use of AI scribe software for clinical note transcription with the patient, who gave verbal consent to proceed.  History of Present Illness Leslie Crawford is a 74 year old right-handed female with HTN, HLD, and history of breast cancer s/p chemo and radiation who presents for left amaurosis fugax.  History supplemented by PCP notes.   On December 27th, 2026, she experienced a sudden onset of visual loss in her left eye, described as a curtain descending halfway, lasting approximately 30 to 60 seconds and resolving spontaneously. She did not experience headache, eye pain, numbness, weakness, or other neurological symptoms during or after the episode. 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 She saw her ophthalmologist who diagnosed her with amaurosis fugax.    Labs from 02/02/2024 revealed sed rate 4, CRP <0.5, LDL 80 and Hgb A1c 5.2.  Bilateral carotid ultrasound on 02/04/2024 revealed plaque at level of both carotid bulbs and proximal ICAs, left greater than right, but no hemodynamically significant stenosis (<50% stenosis).  2D echo showed EF 60-65%, mild aortic stenosis but no  significant valvular disease and no atrial level shunt.   She was started on ASA 81mg  daily right after this event occurred.  She has been taking rosuvastatin  for over a year, with her most recent LDL measured at 80 mg/dL. Hemoglobin A1c was 5.2. She maintains regular exercise, specifically walking, and does not smoke. She denies symptoms of sleep apnea.  She has not had a recurrent event.    Remote MRI of brain on 10/16/2016 revealed chronic small vessel ischemic changes.    PAST MEDICAL HISTORY: Past Medical History:  Diagnosis Date   Allergy    Anemia    Anxiety    Breast cancer (HCC) 2010   S/P Chemo/RadTx- Dr. Maude Rao   Depression    GERD (gastroesophageal reflux disease)    past hx- not current   Hyperlipidemia    Hypertension    Osteopenia    Osteoporosis    improved- no meds-    Personal history of chemotherapy    Personal history of radiation therapy     PAST SURGICAL HISTORY: Past Surgical History:  Procedure Laterality Date   ABDOMINAL HYSTERECTOMY     Total hysterectomy with BSO   BREAST BIOPSY Left 2019   benign   BREAST LUMPECTOMY Left 10/29/2008   Negative markers- 1 positive node   COLONOSCOPY     POLYPECTOMY     SEPTOPLASTY     SHOULDER SURGERY     TONSILLECTOMY AND ADENOIDECTOMY      MEDICATIONS: Medications Ordered Prior to Encounter[1]  ALLERGIES: Allergies[2]  FAMILY HISTORY: Family History  Problem Relation Age of Onset   Cancer Mother        Throat cancer  Esophageal cancer Mother    Cancer Father        Stomach cancer   Stomach cancer Father    Early death Maternal Grandmother    Pneumonia Maternal Grandmother    Heart attack Maternal Grandfather    Heart attack Paternal Grandfather    Colon cancer Neg Hx    Colon polyps Neg Hx    Rectal cancer Neg Hx     Objective:  Blood pressure 120/70, pulse 73, height 5' 4 (1.626 m), weight 152 lb 3.2 oz (69 kg), SpO2 97%. General: No acute distress.  Patient appears well-groomed.    Head:  Normocephalic/atraumatic Eyes:  fundi examined but not visualized Neck: supple, no paraspinal tenderness, full range of motion Heart: regular rate and rhythm Neurological Exam: Mental status: alert and oriented to person, place, and time, speech fluent and not dysarthric, language intact. Cranial nerves: CN I: not tested CN II: pupils equal, round and reactive to light, visual fields intact CN III, IV, VI:  full range of motion, no nystagmus, no ptosis CN V: facial sensation intact. CN VII: upper and lower face symmetric CN VIII: hearing intact CN IX, X: gag intact, uvula midline CN XI: sternocleidomastoid and trapezius muscles intact CN XII: tongue midline Bulk & Tone: normal, no fasciculations. Motor:  muscle strength 5/5 throughout Sensation:  Pinprick and vibratory sensation intact. Deep Tendon Reflexes:  2+ throughout,  toes downgoing.   Finger to nose testing:  Without dysmetria.   Gait:  Normal station and stride.  Romberg negative.    Juliene Dunnings, DO  CC: Harlene Schroeder, MD        [1]  Current Outpatient Medications on File Prior to Visit  Medication Sig Dispense Refill   ALPRAZolam  (XANAX ) 0.25 MG tablet Take 1 tablet (0.25 mg total) by mouth 2 (two) times daily as needed for anxiety. 20 tablet 0   amLODipine  (NORVASC ) 5 MG tablet TAKE 1 TABLET (5 MG TOTAL) BY MOUTH DAILY. 90 tablet 3   [facility] MAGNESIUM CITRATE PO Take 500 mg by mouth.     FLUoxetine  (PROZAC ) 20 MG capsule Take 1 capsule (20 mg total) by mouth daily. 90 capsule 3   ibandronate  (BONIVA ) 150 MG tablet Take 1 tablet (150 mg total) by mouth every 30 (thirty) days. Take in the morning with a full glass of water, on an empty stomach, and do not take anything else by mouth or lie down for the next 30 min. 3 tablet 3   losartan  (COZAAR ) 50 MG tablet Take 1 tablet (50 mg total) by mouth daily. 90 tablet 3   OVER THE COUNTER MEDICATION Take 1 tablet by mouth every evening.     predniSONE  (DELTASONE )  10 MG tablet Take 1 tablet (10 mg total) by mouth daily with breakfast. 30 tablet 0   rosuvastatin  (CRESTOR ) 20 MG tablet Take 1 tablet (20 mg total) by mouth daily. 90 tablet 3   traZODone  (DESYREL ) 50 MG tablet Take 1.5 tablets (75 mg total) by mouth at bedtime as needed for sleep. 135 tablet 3   No current facility-administered medications on file prior to visit.  [2]  Allergies Allergen Reactions   Topamax  [Topiramate ] Other (See Comments)    Decreased cognitive function   "

## 2024-02-24 ENCOUNTER — Encounter: Payer: Self-pay | Admitting: Neurology

## 2024-02-24 ENCOUNTER — Ambulatory Visit

## 2024-02-24 ENCOUNTER — Ambulatory Visit: Payer: Self-pay | Admitting: Neurology

## 2024-02-24 VITALS — BP 120/70 | HR 73 | Ht 64.0 in | Wt 152.2 lb

## 2024-02-24 DIAGNOSIS — G453 Amaurosis fugax: Secondary | ICD-10-CM

## 2024-02-24 DIAGNOSIS — I10 Essential (primary) hypertension: Secondary | ICD-10-CM

## 2024-02-24 DIAGNOSIS — E78019 Familial hypercholesterolemia, unspecified: Secondary | ICD-10-CM | POA: Diagnosis not present

## 2024-02-24 MED ORDER — ROSUVASTATIN CALCIUM 40 MG PO TABS: 40.0000 mg | ORAL_TABLET | Freq: Every day | ORAL | 5 refills | Status: AC

## 2024-02-24 NOTE — Patient Instructions (Addendum)
 Continue aspirin 81mg  daily Increase rosuvastatin  to 40mg  daily.  Repeat fasting lipid panel in 3 months. CTA of the head 2 week cardiac event monitor. Continue exercising/walking Mediterranean diet (see below) Follow up 6 months   Mediterranean Diet A Mediterranean diet is based on the traditions of countries on the [employer]. It focuses on eating more: Fruits and vegetables. Whole grains, beans, nuts, and seeds. Heart-healthy fats. These are fats that are good for your heart. It involves eating less: Dairy. Meat and eggs. Processed foods with added sugar, salt, and fat. This type of diet can help prevent certain conditions. It can also improve outcomes if you have a long-term (chronic) disease, such as kidney or heart disease. What are tips for following this plan? Reading food labels Check packaged foods for: The serving size. For foods such as rice and pasta, the serving size is the amount of cooked product, not dry. The total fat. Avoid foods with saturated fat or trans fat. Added sugars, such as corn syrup. Shopping  Try to have a balanced diet. Buy a variety of foods, such as: Fresh fruits and vegetables. You may be able to get these from local farmers markets. You can also buy them frozen. Grains, beans, nuts, and seeds. Some of these can be bought in bulk. Fresh seafood. Poultry and eggs. Low-fat dairy products. Buy whole ingredients instead of foods that have already been packaged. If you can't get fresh seafood, buy precooked frozen shrimp or canned fish, such as tuna, salmon, or sardines. Stock your pantry so you always have certain foods on hand, such as olive oil, canned tuna, canned tomatoes, rice, pasta, and beans. Cooking Cook foods with extra-virgin olive oil instead of using butter or other vegetable oils. Have meat as a side dish. Have vegetables or grains as your main dish. This means having meat in small portions or adding small amounts of meat to  foods like pasta or stew. Use beans or vegetables instead of meat in common dishes like chili or lasagna. Try out different cooking methods. Try roasting, broiling, steaming, and sauting vegetables. Add frozen vegetables to soups, stews, pasta, or rice. Add nuts or seeds for added healthy fats and plant protein at each meal. You can add these to yogurt, salads, or vegetable dishes. Marinate fish or vegetables using olive oil, lemon juice, garlic, and fresh herbs. Meal planning Plan to eat a vegetarian meal one day each week. Try to work up to two vegetarian meals, if possible. Eat seafood two or more times a week. Have healthy snacks on hand. These may include: Vegetable sticks with hummus. Greek yogurt. Fruit and nut trail mix. Eat balanced meals. These should include: Fruit: 2-3 servings a day. Vegetables: 4-5 servings a day. Low-fat dairy: 2 servings a day. Fish, poultry, or lean meat: 1 serving a day. Beans and legumes: 2 or more servings a week. Nuts and seeds: 1-2 servings a day. Whole grains: 6-8 servings a day. Extra-virgin olive oil: 3-4 servings a day. Limit red meat and sweets to just a few servings a month. Lifestyle  Try to cook and eat meals with your family. Drink enough fluid to keep your pee (urine) pale yellow. Be active every day. This includes: Aerobic exercise, which is exercise that causes your heart to beat faster. Examples include running and swimming. Leisure activities like gardening, walking, or housework. Get 7-8 hours of sleep each night. Drink red wine if your provider says you can. A glass of wine is 5 oz (  150 mL). You may be allowed to have: Up to 1 glass a day if you're female and not pregnant. Up to 2 glasses a day if you're female. What foods should I eat? Fruits Apples. Apricots. Avocado. Berries. Bananas. Cherries. Dates. Figs. Grapes. Lemons. Melon. Oranges. Peaches. Plums. Pomegranate. Vegetables Artichokes. Beets. Broccoli. Cabbage.  Carrots. Eggplant. Green beans. Chard. Kale. Spinach. Onions. Leeks. Peas. Squash. Tomatoes. Peppers. Radishes. Grains Whole-grain pasta. Brown rice. Bulgur wheat. Polenta. Couscous. Whole-wheat bread. [place] Madeira. Meats and other proteins Beans. Almonds. Sunflower seeds. Pine nuts. Peanuts. Cod. Salmon. Scallops. Shrimp. Tuna. Tilapia. Clams. Oysters. Eggs. Chicken or turkey without skin. Dairy Low-fat milk. Cheese. Greek yogurt. Fats and oils Extra-virgin olive oil. Avocado oil. Grapeseed oil. Beverages Water. Red wine. Herbal tea. Sweets and desserts Greek yogurt with honey. Baked apples. Poached pears. Trail mix. Seasonings and condiments Basil. Cilantro. Coriander. Cumin. Mint. Parsley. Sage. Rosemary. Tarragon. Garlic. Oregano. Thyme. Pepper. Balsamic vinegar. Tahini. Hummus. Tomato sauce. Olives. Mushrooms. The items listed above may not be all the foods and drinks you can have. Talk to a dietitian to learn more. What foods should I limit? This is a list of foods that should be eaten rarely. Fruits Fruit canned in syrup. Vegetables Deep-fried potatoes, like French fries. Grains Packaged pasta or rice dishes. Cereal with added sugar. Snacks with added sugar. Meats and other proteins Beef. Pork. Lamb. Chicken or turkey with skin. Hot dogs. [place]. Dairy Ice cream. Sour cream. Whole milk. Fats and oils Butter. Canola oil. Vegetable oil. Beef fat (tallow). Lard. Beverages Juice. Sugar-sweetened soft drinks. Beer. Liquor and spirits. Sweets and desserts Cookies. Cakes. Pies. Candy. Seasonings and condiments Mayonnaise. Pre-made sauces and marinades. The items listed above may not be all the foods and drinks you should limit. Talk to a dietitian to learn more. Where to find more information American Heart Association (AHA): heart.org This information is not intended to replace advice given to you by your health care provider. Make sure you discuss any questions you have  with your health care provider. Document Revised: 04/26/2022 Document Reviewed: 04/26/2022 Elsevier Patient Education  2024 [employer].

## 2024-02-24 NOTE — Progress Notes (Unsigned)
Enrolled for [employer] to mail a ZIO AT Live Telemetry monitor to patients address on file.   Dr. Hochrein to read. 

## 2024-02-29 ENCOUNTER — Encounter: Payer: Self-pay | Admitting: Neurology

## 2024-03-06 ENCOUNTER — Inpatient Hospital Stay: Admission: RE | Admit: 2024-03-06

## 2024-05-09 ENCOUNTER — Other Ambulatory Visit

## 2024-06-21 ENCOUNTER — Encounter: Admitting: Family Medicine

## 2024-08-31 ENCOUNTER — Ambulatory Visit: Payer: Self-pay | Admitting: Neurology
